# Patient Record
Sex: Female | Born: 1980 | Race: Black or African American | Hispanic: No | Marital: Married | Smoking: Current every day smoker
Health system: Southern US, Community
[De-identification: ages and names within clinical notes are randomized; demographics above are authoritative.]

## PROBLEM LIST (undated history)

## (undated) ENCOUNTER — Inpatient Hospital Stay (HOSPITAL_COMMUNITY): Payer: Self-pay

## (undated) DIAGNOSIS — D219 Benign neoplasm of connective and other soft tissue, unspecified: Secondary | ICD-10-CM

## (undated) DIAGNOSIS — N912 Amenorrhea, unspecified: Secondary | ICD-10-CM

## (undated) DIAGNOSIS — F32A Depression, unspecified: Secondary | ICD-10-CM

## (undated) DIAGNOSIS — F329 Major depressive disorder, single episode, unspecified: Secondary | ICD-10-CM

## (undated) DIAGNOSIS — R011 Cardiac murmur, unspecified: Secondary | ICD-10-CM

## (undated) DIAGNOSIS — G2581 Restless legs syndrome: Secondary | ICD-10-CM

## (undated) DIAGNOSIS — D649 Anemia, unspecified: Secondary | ICD-10-CM

## (undated) DIAGNOSIS — F419 Anxiety disorder, unspecified: Secondary | ICD-10-CM

## (undated) DIAGNOSIS — L259 Unspecified contact dermatitis, unspecified cause: Secondary | ICD-10-CM

## (undated) DIAGNOSIS — N390 Urinary tract infection, site not specified: Secondary | ICD-10-CM

## (undated) HISTORY — DX: Anemia, unspecified: D64.9

## (undated) HISTORY — DX: Amenorrhea, unspecified: N91.2

## (undated) HISTORY — DX: Benign neoplasm of connective and other soft tissue, unspecified: D21.9

## (undated) HISTORY — DX: Cardiac murmur, unspecified: R01.1

## (undated) HISTORY — DX: Restless legs syndrome: G25.81

## (undated) HISTORY — PX: WISDOM TOOTH EXTRACTION: SHX21

## (undated) HISTORY — DX: Unspecified contact dermatitis, unspecified cause: L25.9

## (undated) HISTORY — DX: Urinary tract infection, site not specified: N39.0

---

## 2010-10-16 NOTE — L&D Delivery Note (Signed)
Delivery Note At 11:32 PM a viable female was delivered via Vaginal, Spontaneous Delivery (Presentation: ; Occiput Transverse).  APGAR: 9, 9; weight 7-14.   Placenta status: Intact, Spontaneous.  Cord: 3 vessels with the following complications: None.  Cord pH: NA  Anesthesia: None  Episiotomy: None Lacerations: None Suture Repair: None Est. Blood Loss (mL): 350  Mom to postpartum.  Baby to nursery-stable.  OP Circ,  Placenta to [facility] Vasectomy Breast   Holly Vazquez 08/05/2011, 12:05 AM

## 2010-10-24 LAB — TSH: TSH: 0.859

## 2010-10-25 LAB — FOLATE: Folate: >19.9

## 2011-01-12 LAB — CBC: Hemoglobin: 11.3 g/dL — AB (ref 12.0–16.0)

## 2011-01-12 LAB — ABO/RH

## 2011-01-12 LAB — RUBELLA ANTIBODY, IGM: Rubella: IMMUNE

## 2011-01-12 LAB — HIV ANTIBODY (ROUTINE TESTING W REFLEX): HIV: NONREACTIVE

## 2011-01-12 LAB — ANTIBODY SCREEN: Antibody Screen: NEGATIVE

## 2011-01-12 LAB — RPR: RPR: NONREACTIVE

## 2011-01-12 LAB — HEPATITIS B SURFACE ANTIGEN: Hepatitis B Surface Ag: NEGATIVE

## 2011-02-15 LAB — GLUCOSE, 1 HOUR GESTATIONAL: Blood: 73

## 2011-07-05 ENCOUNTER — Encounter: Payer: Self-pay | Admitting: Obstetrics and Gynecology

## 2011-07-05 LAB — VITAMIN B12: Vitamin B12 Deficiency Panel: 947

## 2011-07-06 ENCOUNTER — Ambulatory Visit (INDEPENDENT_AMBULATORY_CARE_PROVIDER_SITE_OTHER): Payer: Medicaid Other | Admitting: Family

## 2011-07-06 VITALS — BP 111/68 | Temp 97.9°F | Ht 63.0 in | Wt 159.3 lb

## 2011-07-06 DIAGNOSIS — Z331 Pregnant state, incidental: Secondary | ICD-10-CM

## 2011-07-06 DIAGNOSIS — D649 Anemia, unspecified: Secondary | ICD-10-CM | POA: Insufficient documentation

## 2011-07-06 LAB — POCT URINALYSIS DIP (DEVICE)
Glucose, UA: NEGATIVE mg/dL
Hgb urine dipstick: NEGATIVE
Ketones, ur: NEGATIVE mg/dL
Protein, ur: NEGATIVE mg/dL
Specific Gravity, Urine: 1.02 (ref 1.005–1.030)
Urobilinogen, UA: 0.2 mg/dL (ref 0.0–1.0)

## 2011-07-06 MED ORDER — INTEGRA F 125-1 MG PO CAPS: 1.0000 | ORAL_CAPSULE | Freq: Every day | ORAL | Status: DC

## 2011-07-06 NOTE — Progress Notes (Signed)
Edema-feet/hands. Pain/Pressure- lower pelvic, "tightening".  Clear/white/"gold" vaginal discharge w/o itching.  Pt c/o tingling in hands and feet sometimes, and also very tired.

## 2011-07-06 NOTE — Progress Notes (Signed)
Reviewed prenatal records from [place]; problems with iron and constipation; reports irregular contractions, cervix closed.  Reviewed practice and call schedule.  Considering water birth; discussed liner.  Transfer to low risk clinic; obtain lab from [place] (1hr glucola at 28 wks); early GCT wnl.

## 2011-07-07 LAB — GC/CHLAMYDIA PROBE AMP, GENITAL: GC Probe Amp, Genital: NEGATIVE

## 2011-07-12 ENCOUNTER — Ambulatory Visit (INDEPENDENT_AMBULATORY_CARE_PROVIDER_SITE_OTHER): Payer: Medicaid Other | Admitting: Family Medicine

## 2011-07-12 VITALS — BP 114/71 | Temp 98.7°F | Wt 161.1 lb

## 2011-07-12 DIAGNOSIS — Z3480 Encounter for supervision of other normal pregnancy, unspecified trimester: Secondary | ICD-10-CM

## 2011-07-12 LAB — POCT URINALYSIS DIP (DEVICE)
Bilirubin Urine: NEGATIVE
Glucose, UA: NEGATIVE mg/dL
Hgb urine dipstick: NEGATIVE
Ketones, ur: NEGATIVE mg/dL
Nitrite: NEGATIVE
Protein, ur: NEGATIVE mg/dL
Specific Gravity, Urine: 1.01 (ref 1.005–1.030)
Urobilinogen, UA: 0.2 mg/dL (ref 0.0–1.0)
pH: 6 (ref 5.0–8.0)

## 2011-07-12 NOTE — Progress Notes (Signed)
Patient was seen and evaluated with PA-S Joseph Berkshire, agree with documentation.

## 2011-07-12 NOTE — Progress Notes (Signed)
P=84, c/o pelvic pain/pressure- worse on left, and lower back pain,

## 2011-07-12 NOTE — Patient Instructions (Signed)
SEEK IMMEDIATE MEDICAL CARE IF:  You develop contractions that continue to become stronger, more regular, and closer together.   You have a gushing, burst or leaking of fluid from the vagina.   An oral temperature above 100.4 F develops.   You have passage of blood-tinged mucus.   You develop vaginal bleeding.   You develop continuous belly (abdominal) pain.   You have low back pain that you never had before.   You feel the baby's head pushing down causing pelvic pressure.   The baby is not moving as much as it used to.  Document Released: 10/02/2005 Document Re-Released: 03/22/2010 ExitCare Patient Information 2011 [employer]. 

## 2011-07-12 NOTE — Progress Notes (Signed)
Subjective:    Holly Vazquez is a 29 y.o. female being seen today for her obstetrical visit. She is at [redacted]w[redacted]d gestation. Patient reports no bleeding, no leaking and occasional contractions. Fetal movement: normal.  Menstrual History: OB History    Grav Para Term Preterm Abortions TAB SAB Ect Mult Living   4 3 3  0 0 0 0 0 0 3        Objective:    BP 114/71  Temp 98.7 F (37.1 C)  Wt 161 lb 1.6 oz (73.074 kg)  LMP 10/26/2010  Breastfeeding? Unknown FHT: 140 BPM  Uterine Size: 37 cm  Presentations: unsure  Pelvic Exam:              Dilation: Closed       Effacement: 50%             Station:  -3    Consistency: soft            Position: posterior     Assessment:    Pregnancy 37 and 0/7 weeks   Plan:   Plans for delivery: Vaginal anticipated Beta strep culture: done Counseling: Infant feeding: plans to breastfeed. Fetal testing: discussed Follow up in 1 Week.   Birth control options discussed in detail. Patient has not yet decided what method she will use.

## 2011-07-19 ENCOUNTER — Ambulatory Visit (INDEPENDENT_AMBULATORY_CARE_PROVIDER_SITE_OTHER): Payer: Medicaid Other | Admitting: Obstetrics and Gynecology

## 2011-07-19 VITALS — BP 121/70 | Temp 98.5°F | Wt 163.5 lb

## 2011-07-19 DIAGNOSIS — Z3480 Encounter for supervision of other normal pregnancy, unspecified trimester: Secondary | ICD-10-CM

## 2011-07-19 DIAGNOSIS — Z3490 Encounter for supervision of normal pregnancy, unspecified, unspecified trimester: Secondary | ICD-10-CM

## 2011-07-19 LAB — POCT URINALYSIS DIP (DEVICE)
Hgb urine dipstick: NEGATIVE
Ketones, ur: NEGATIVE mg/dL
Protein, ur: NEGATIVE mg/dL
Specific Gravity, Urine: 1.015 (ref 1.005–1.030)

## 2011-07-19 NOTE — Progress Notes (Signed)
Pt reports pelvic pain and pressure as well as contractions. Pt declines flu shot, form signed.

## 2011-07-19 NOTE — Progress Notes (Signed)
This patient is a 30 year old gravida 4 para 29 and her 38th week of pregnancy. She's had no problems and few complaints.  Her blood pressure today is 121/70 pulse of 86 per minute. Fetal heart is 148 and the mid suprapubic area. The fetus palpates in a vertex presentation with the back to the left side. She is no edema DTRs are within normal limits and no proteinuria.  We've discussed with the patient does procedures occur when she reaches her due date and if not delivered at 41 weeks she'll be induced.   She will return for routine visit in one week.

## 2011-08-02 ENCOUNTER — Ambulatory Visit (INDEPENDENT_AMBULATORY_CARE_PROVIDER_SITE_OTHER): Payer: Medicaid Other | Admitting: Family Medicine

## 2011-08-02 ENCOUNTER — Encounter: Payer: Self-pay | Admitting: Family Medicine

## 2011-08-02 DIAGNOSIS — O0930 Supervision of pregnancy with insufficient antenatal care, unspecified trimester: Secondary | ICD-10-CM

## 2011-08-02 LAB — POCT URINALYSIS DIP (DEVICE)
Bilirubin Urine: NEGATIVE
Glucose, UA: NEGATIVE mg/dL
Hgb urine dipstick: NEGATIVE
Specific Gravity, Urine: 1.02 (ref 1.005–1.030)
Urobilinogen, UA: 0.2 mg/dL (ref 0.0–1.0)

## 2011-08-02 NOTE — Progress Notes (Signed)
Patient seen - irreg contractions.  Good fetal activity.  No vaginal bleeding, d/c.  Will schedule for induction next week.  Follow up in 1 week.

## 2011-08-02 NOTE — Progress Notes (Signed)
Edema- feet. Pulse- 84. Pain/pressure- "during the night". Vaginal discharge- "clear, yellowish"

## 2011-08-02 NOTE — Patient Instructions (Signed)
Labor Induction A pregnant woman usually goes into labor spontaneously before the birth of her baby. Most babies are born between 37 and 42 weeks of the pregnancy. When this does not happen, caregivers may use medication or other methods to bring on (induce) labor. Labor induction causes a pregnant woman's uterus to contract, the cervix to open (dilate) and thin out (efface) to prepare for the vaginal birth of her baby. Several methods of labor induction may be used such as:  Massaging the nipple and areola of the breasts (nipple stimulation).   Prostaglandin medication used orally or as a vaginal cream.   Striping of membranes (your caregiver inserts a finger between the cervix and membranes around the baby's head) causes the body to produce prostaglandins that soften the cervix and cause the uterus to contract.   Rupture of the water bag (amniotomy).   Oxytocin by IV.   Special dilators placed into the cervical canal that causes the cervix to soften and open.   Mechanical devices to stretch open the cervix such as, a dilated foley catheter.  Whether your labor will be induced depends on the condition of you and your baby, how far along you are, are the baby's lung maturity, the condition of the cervix, the way the baby is lying, and other factors. Usually, labor is not induced before 39 weeks of the pregnancy unless there is a problem with the baby or mother, and it becomes necessary to induce labor. REASONS LABOR SHOULD BE INDUCED:  The health of the baby or mother has become at risk.   The pregnancy is overdue by 2 weeks or more.   Your water breaks (premature rupture of membranes), the baby's lungs are mature, and labor does not start on its own.   You develop high blood pressure (toxemia of pregnancy).   You develop an infection in your uterus.   You have diabetes or other serious medical illness.   Amniotic fluid amounts are small around the baby.   Your placenta begins to  separate from the inner wall of the uterus before the baby is born (placental abruption). This condition may cause you to have an emergency Cesarean delivery.   You have fetal death.   A social induction is also known as an induction for convenience. Most of the time, labor is induced for sound medical reasons. Sometimes, it is done as a convenience. Living a long way from the hospital or having a history of very rapid labors may be reasons the mother may want to induce delivery.  REASONS LABOR SHOULD NOT BE INDUCED:  You have had previous surgeries on your uterus. This is especially true if the surgeries went into the inside lining and cavity of the uterus. This gives an added risk for rupturing the uterus.   You have placenta previa. This means your placenta lies very low in the uterus and blocks the opening (cervix) for the baby to get out.   Your baby is not in a head down position. For example, if your baby lies across your uterus (transverse) instead of head first.   If the umbilical cord drops down into the birth canal in front of your baby. This could cut off the baby's blood supply and oxygen to the baby.  RISKS & COMPLICATIONS OF THE PROCEDURE Problems seldom occur with labor induction, but there can be some complications. Some of the risks of induction include:  Change in fetal heart rate (too high, too low or irradic).     Increased risk of a premature baby, even if you think your baby is term.   Increased risk of fetal distress. This means your baby gets into problems during induction. This can be caused by the umbilical cord coming out in front of the baby or is being squeezed.   Increased risk of infection to mother and baby.   Increased chance of having a Cesarean delivery. This is an operation on your belly (abdomen) to remove the baby.   Strong contractions can lead to abruption. This is a separating of the placenta from the uterus.   Uterine rupture, especially if you  had a previous Cesarean or surgery on your uterus.  When labor is induced because of medical problems, other risks may be present. Induced labor may lead to:  Increased use of medications for pain relief.   Continuous fetal monitoring.   Other interventions.  When induction is needed for medical reasons, the benefits of induction may outweigh the risks. INDUCTION PROCEDURE It can sometimes take up to 2 or 3 days to induce labor. It usually takes less time. It takes longer when you are induced early in the pregnancy and for first pregnancies.  Before coming to the hospital for an induction:  Do not eat much before you come to the hospital (for at least 8 hours).   Do not eat after midnight if you are going to be induced the next morning.   Be aware that medications for labor induction can upset your stomach.   Let your caregiver know if you need medications for pain.  HOME CARE INSTRUCTIONS If you have been induced in your caregiver's office to start labor, and are allowed to go home, follow the instructions given to you by your care giver. SEEK IMMEDIATE MEDICAL CARE IF:  You develop any kind of vaginal bleeding.   You develop contractions that are severe and continuous.   You feel faint or feel light headed.   You do not develop contractions within the time your caregiver suggests you should.   You begin to run a temperature of 100 F (37.8 C) or develop chills.   You no longer feel the normal fetal movement.  Document Released: 02/21/2007 Document Re-Released: 07/29/2009 ExitCare Patient Information 2011 [employer]. 

## 2011-08-04 ENCOUNTER — Encounter (HOSPITAL_COMMUNITY): Payer: Self-pay

## 2011-08-04 ENCOUNTER — Inpatient Hospital Stay (HOSPITAL_COMMUNITY)
Admission: AD | Admit: 2011-08-04 | Discharge: 2011-08-06 | DRG: 775 | Disposition: A | Discharge status: Home / Self Care | Payer: Medicaid Other | Source: Ambulatory Visit | Attending: Obstetrics & Gynecology | Admitting: Obstetrics & Gynecology

## 2011-08-04 DIAGNOSIS — IMO0001 Reserved for inherently not codable concepts without codable children: Secondary | ICD-10-CM

## 2011-08-04 DIAGNOSIS — O9902 Anemia complicating childbirth: Principal | ICD-10-CM | POA: Diagnosis present

## 2011-08-04 DIAGNOSIS — D649 Anemia, unspecified: Secondary | ICD-10-CM | POA: Diagnosis present

## 2011-08-04 HISTORY — DX: Depression, unspecified: F32.A

## 2011-08-04 HISTORY — DX: Anxiety disorder, unspecified: F41.9

## 2011-08-04 HISTORY — DX: Major depressive disorder, single episode, unspecified: F32.9

## 2011-08-04 LAB — CBC
MCV: 73.8 fL — ABNORMAL LOW (ref 78.0–100.0)
Platelets: 163 10*3/uL (ref 150–400)
RBC: 5.07 MIL/uL (ref 3.87–5.11)
WBC: 9.5 10*3/uL (ref 4.0–10.5)

## 2011-08-04 MED ORDER — OXYTOCIN 20 UNITS IN LACTATED RINGERS INFUSION - SIMPLE
125.0000 mL/h | Freq: Once | INTRAVENOUS | Status: DC
  Administered 2011-08-04: 999 mL/h via INTRAVENOUS

## 2011-08-04 MED ORDER — OXYCODONE-ACETAMINOPHEN 5-325 MG PO TABS
2.0000 | ORAL_TABLET | ORAL | Status: DC | PRN
  Administered 2011-08-05: 2 via ORAL
  Filled 2011-08-04: qty 2

## 2011-08-04 MED ORDER — ACETAMINOPHEN 325 MG PO TABS: 650.0000 mg | ORAL_TABLET | ORAL | Status: DC | PRN

## 2011-08-04 MED ORDER — IBUPROFEN 600 MG PO TABS: 600.0000 mg | ORAL_TABLET | Freq: Four times a day (QID) | ORAL | Status: DC | PRN

## 2011-08-04 MED ORDER — LACTATED RINGERS IV SOLN
INTRAVENOUS | Status: DC
  Administered 2011-08-04: 125 mL/h via INTRAVENOUS

## 2011-08-04 MED ORDER — CITRIC ACID-SODIUM CITRATE 334-500 MG/5ML PO SOLN: 30.0000 mL | ORAL | Status: DC | PRN

## 2011-08-04 MED ORDER — LIDOCAINE HCL (PF) 1 % IJ SOLN
30.0000 mL | INTRAMUSCULAR | Status: DC | PRN
  Filled 2011-08-04 (×2): qty 30

## 2011-08-04 MED ORDER — LACTATED RINGERS IV SOLN: 500.0000 mL | INTRAVENOUS | Status: DC | PRN

## 2011-08-04 MED ORDER — FLEET ENEMA 7-19 GM/118ML RE ENEM: 1.0000 | ENEMA | RECTAL | Status: DC | PRN

## 2011-08-04 MED ORDER — ONDANSETRON HCL 4 MG/2ML IJ SOLN: 4.0000 mg | Freq: Four times a day (QID) | INTRAMUSCULAR | Status: DC | PRN

## 2011-08-04 MED ORDER — OXYTOCIN BOLUS FROM INFUSION
500.0000 mL | Freq: Once | INTRAVENOUS | Status: DC
  Filled 2011-08-04: qty 1000
  Filled 2011-08-04: qty 500

## 2011-08-04 MED ORDER — NALBUPHINE SYRINGE 5 MG/0.5 ML
5.0000 mg | INJECTION | INTRAMUSCULAR | Status: DC
  Administered 2011-08-04 (×2): 5 mg via INTRAVENOUS
  Filled 2011-08-04 (×10): qty 0.5

## 2011-08-04 NOTE — Progress Notes (Signed)
Pt states unsure of cervical exam, g4, denies lof, has lost mucus plug, small amt bloody show, +FM. Ctx's q3-5 minutes x1 hour.

## 2011-08-04 NOTE — H&P (Signed)
Holly Vazquez is a 30 y.o. G4P3003 at 40.2 wks via LMP verified by first trimester ultrasound who is presenting for contractions and a labor check.  She has received her prenatal care here at low risk clinic at [facility]  She transferred her care at 36 weeks after having PNC s/p 8 weeks in [place].  Only complication with pregnancy was anemia, otherwise she's doing well.  She currently denies any nausea, vomiting, fevers, chills, vaginal fluid, decrease in fetal activity, vaginal bleeding, or lower extermity pain or swelling.   Maternal Medical History:  Reason for admission: Reason for admission: contractions.  Contractions: Frequency: regular.   Perceived severity is moderate.    Prenatal complications: No bleeding.     OB History    Grav Para Term Preterm Abortions TAB SAB Ect Mult Living   4 3 3  0 0 0 0 0 0 3     Past Medical History  Diagnosis Date  . Heart murmur   . Anemia     due to heavy menses  . Restless legs syndrome (RLS)   . Urinary tract infection, site not specified   . Absence of menstruation   . Contact dermatitis and other eczema, due to unspecified cause   . Anxiety   . Depression    Past Surgical History  Procedure Date  . Wisdom tooth extraction    Family History: family history includes Hypertension in her father. Social History:  reports that she has never smoked. She has never used smokeless tobacco. She reports that she does not drink alcohol or use illicit drugs.  Review of Systems  Constitutional: Negative for fever and chills.  HENT: Negative for hearing loss and neck pain.   Eyes: Negative for blurred vision and double vision.  Respiratory: Negative for cough, shortness of breath and wheezing.   Cardiovascular: Negative.  Negative for chest pain.  Gastrointestinal: Negative for heartburn and vomiting.  Genitourinary: Negative for dysuria and urgency.  Musculoskeletal: Negative for myalgias.  Skin: Negative for itching and  rash.  Neurological: Negative for dizziness and headaches.  Psychiatric/Behavioral: Negative for depression.    Dilation: 4 Effacement (%): 80 Station: -2 Exam by:: BBOW, VSmith, CNM Blood pressure 121/81, pulse 107, temperature 98.3 F (36.8 C), temperature source Oral, resp. rate 16, height 5\' 4"  (1.626 m), weight 162 lb 5 oz (73.624 kg), last menstrual period 10/26/2010, unknown if currently breastfeeding.   Exam Physical Exam  Constitutional: She is oriented to person, place, and time. She appears well-developed and well-nourished.  HENT:  Head: Normocephalic and atraumatic.  Eyes: Conjunctivae and EOM are normal. Pupils are equal, round, and reactive to light.  Neck: Normal range of motion. Neck supple.  Cardiovascular: Normal rate, regular rhythm and normal heart sounds.   Respiratory: Effort normal and breath sounds normal.  GI: Soft. Bowel sounds are normal.       Gravid uterus, fundal heights consistent with dates.    Musculoskeletal: Normal range of motion.  Neurological: She is alert and oriented to person, place, and time. She has normal reflexes.  Skin: Skin is warm and dry.  Psychiatric: She has a normal mood and affect. Her behavior is normal.    Prenatal labs: ABO, Rh: B/Positive/-- (03/29 0000) Antibody: Negative (03/29 0000) Rubella: Immune (03/29 0000) RPR: Nonreactive (03/29 0000)  HBsAg: Negative (03/29 0000)  HIV: Non-reactive (03/29 0000)  GBS: Negative (09/20 0000)  1 hour Glucola:  73  Assessment/Plan: 30 yo G4P3003 at 40.2 weeks in active labor: - Admit  to labor and delivery ward. - GBS negative  - Epidural upon request for pain control.    WALDEN,JEFF 08/04/2011, 7:38 PM  Dating criteria addended.   Dorathy Kinsman 08/04/2011 8:28 PM

## 2011-08-05 ENCOUNTER — Encounter (HOSPITAL_COMMUNITY): Payer: Self-pay | Admitting: Family Medicine

## 2011-08-05 LAB — RPR: RPR Ser Ql: NONREACTIVE

## 2011-08-05 MED ORDER — METHYLERGONOVINE MALEATE 0.2 MG/ML IJ SOLN: 0.2000 mg | INTRAMUSCULAR | Status: DC | PRN

## 2011-08-05 MED ORDER — SIMETHICONE 80 MG PO CHEW: 80.0000 mg | CHEWABLE_TABLET | ORAL | Status: DC | PRN

## 2011-08-05 MED ORDER — ZOLPIDEM TARTRATE 5 MG PO TABS: 5.0000 mg | ORAL_TABLET | Freq: Every evening | ORAL | Status: DC | PRN

## 2011-08-05 MED ORDER — DIPHENHYDRAMINE HCL 25 MG PO CAPS: 25.0000 mg | ORAL_CAPSULE | Freq: Four times a day (QID) | ORAL | Status: DC | PRN

## 2011-08-05 MED ORDER — SENNOSIDES-DOCUSATE SODIUM 8.6-50 MG PO TABS
2.0000 | ORAL_TABLET | Freq: Every day | ORAL | Status: DC
  Administered 2011-08-05: 2 via ORAL

## 2011-08-05 MED ORDER — PRENATAL PLUS 27-1 MG PO TABS
1.0000 | ORAL_TABLET | Freq: Every day | ORAL | Status: DC
  Administered 2011-08-05 – 2011-08-06 (×2): 1 via ORAL
  Filled 2011-08-05 (×2): qty 1

## 2011-08-05 MED ORDER — OXYCODONE-ACETAMINOPHEN 5-325 MG PO TABS
1.0000 | ORAL_TABLET | ORAL | Status: DC | PRN
  Administered 2011-08-05 (×3): 1 via ORAL
  Administered 2011-08-06: 2 via ORAL
  Filled 2011-08-05 (×3): qty 1
  Filled 2011-08-05: qty 2

## 2011-08-05 MED ORDER — DIBUCAINE 1 % RE OINT: 1.0000 "application " | TOPICAL_OINTMENT | RECTAL | Status: DC | PRN

## 2011-08-05 MED ORDER — IBUPROFEN 600 MG PO TABS
600.0000 mg | ORAL_TABLET | Freq: Four times a day (QID) | ORAL | Status: DC
  Administered 2011-08-05 – 2011-08-06 (×6): 600 mg via ORAL
  Filled 2011-08-05 (×6): qty 1

## 2011-08-05 MED ORDER — METHYLERGONOVINE MALEATE 0.2 MG PO TABS: 0.2000 mg | ORAL_TABLET | ORAL | Status: DC | PRN

## 2011-08-05 MED ORDER — TETANUS-DIPHTH-ACELL PERTUSSIS 5-2.5-18.5 LF-MCG/0.5 IM SUSP: 0.5000 mL | Freq: Once | INTRAMUSCULAR | Status: DC

## 2011-08-05 MED ORDER — ONDANSETRON HCL 4 MG PO TABS: 4.0000 mg | ORAL_TABLET | ORAL | Status: DC | PRN

## 2011-08-05 MED ORDER — WITCH HAZEL-GLYCERIN EX PADS: 1.0000 "application " | MEDICATED_PAD | CUTANEOUS | Status: DC | PRN

## 2011-08-05 MED ORDER — ONDANSETRON HCL 4 MG/2ML IJ SOLN: 4.0000 mg | INTRAMUSCULAR | Status: DC | PRN

## 2011-08-05 MED ORDER — MEDROXYPROGESTERONE ACETATE 150 MG/ML IM SUSP: 150.0000 mg | INTRAMUSCULAR | Status: DC | PRN

## 2011-08-05 MED ORDER — MAGNESIUM HYDROXIDE 400 MG/5ML PO SUSP: 30.0000 mL | ORAL | Status: DC | PRN

## 2011-08-05 MED ORDER — FERROUS SULFATE 325 (65 FE) MG PO TABS
325.0000 mg | ORAL_TABLET | Freq: Two times a day (BID) | ORAL | Status: DC
  Administered 2011-08-05 – 2011-08-06 (×3): 325 mg via ORAL
  Filled 2011-08-05 (×3): qty 1

## 2011-08-05 MED ORDER — MEASLES, MUMPS & RUBELLA VAC ~~LOC~~ INJ
0.5000 mL | INJECTION | Freq: Once | SUBCUTANEOUS | Status: DC
  Filled 2011-08-05: qty 0.5

## 2011-08-05 MED ORDER — LANOLIN HYDROUS EX OINT: 1.0000 "application " | TOPICAL_OINTMENT | CUTANEOUS | Status: DC | PRN

## 2011-08-05 MED ORDER — BENZOCAINE-MENTHOL 20-0.5 % EX AERO: 1.0000 "application " | INHALATION_SPRAY | CUTANEOUS | Status: DC | PRN

## 2011-08-05 NOTE — Progress Notes (Signed)
Holly Vazquez is a 30 y.o. [id number] Subjective: Coping well w/ nubain  Objective: BP 119/78  Pulse 100  Temp(Src) 97.8 F (36.6 C) (Oral)  Resp 20  Ht 5\' 4"  (1.626 m)  Wt 73.624 kg (162 lb 5 oz)  BMI 27.86 kg/m2  LMP 10/26/2010  Breastfeeding? Unknown      FHT:  FHR: 130 bpm, variability: moderate,  accelerations:  Present,  decelerations:  Absent UC:   regular, every 3 minutes SVE:   Dilation: 6 Effacement (%): 90 Station: -1 Taylor, RN  Labs: Lab Results  Component Value Date   WBC 9.5 08/04/2011   HGB 11.8* 08/04/2011   HCT 37.4 08/04/2011   MCV 73.8* 08/04/2011   PLT 163 08/04/2011    Assessment / Plan: Spontaneous labor, progressing normally  Labor: Progressing normally Preeclampsia:  NA Fetal Wellbeing:  Category I Pain Control:  Nubain I/D:  n/a Anticipated MOD:  NSVD  Holly Vazquez 08/04/2011, 22:30 AM

## 2011-08-05 NOTE — Progress Notes (Signed)
NSVD of a viable female.

## 2011-08-05 NOTE — H&P (Signed)
Agree with above note.  Holly Vazquez H. 08/05/2011 5:04 AM

## 2011-08-05 NOTE — Progress Notes (Signed)
Post Partum Day 1 Subjective: no complaints, up ad lib, voiding, tolerating PO, + flatus and baby is doing well  Objective: Blood pressure 113/67, pulse 89, temperature 98.2 F (36.8 C), temperature source Oral, resp. rate 20, height 5\' 4"  (1.626 m), weight 73.624 kg (162 lb 5 oz), last menstrual period 10/26/2010, SpO2 97.00%, unknown if currently breastfeeding.  Physical Exam:  General: alert and no distress Lochia: appropriate Uterine Fundus: firm DVT Evaluation: No evidence of DVT seen on physical exam.   Basename 08/04/11 2024  HGB 11.8*  HCT 37.4    Assessment/Plan: Plan for discharge tomorrow, Breastfeeding, Lactation consult and Contraception is vasectomy for female partner.  Considering outpatient circumcision for her female infant. Continue routine postpartum care.   LOS: 1 day   ANYANWU,UGONNA A 08/05/2011, 9:04 AM

## 2011-08-06 MED ORDER — IBUPROFEN 600 MG PO TABS: 600.0000 mg | ORAL_TABLET | Freq: Four times a day (QID) | ORAL | Status: AC | PRN

## 2011-08-06 NOTE — Discharge Summary (Signed)
Obstetric Discharge Summary Reason for Admission: onset of labor Prenatal Procedures: NST Intrapartum Procedures: spontaneous vaginal delivery Postpartum Procedures: none Complications-Operative and Postpartum: none Hemoglobin  Date Value Range Status  08/04/2011 11.8* 12.0-15.0 (g/dL) Final     HCT  Date Value Range Status  08/04/2011 37.4  36.0-46.0 (%) Final    Discharge Diagnoses: Term Pregnancy-delivered  Discharge Information: Date: 08/06/2011 Activity: unrestricted and pelvic rest Diet: routine Medications: Ibuprofen Condition: stable Instructions: refer to practice specific booklet Discharge to: home Follow-up Information    Follow up with [facility]. Make an appointment in 6 weeks. (If you have excessive bleeding, pain or fever come to MAU as needed)    Contact information:   [address] [place] [address] [phone]        Plans vasectomy for contraception.  Declines other contraception in the interim.  Breastfeeding Return to clinic in 6 weeks for PP visit.  Newborn Data: Live born female  Birth Weight: 7 lb 14.3 oz (3580 g) APGAR: 8, 9  Home with mother.  [facility] 08/06/2011, 9:32 AM

## 2011-08-06 NOTE — Progress Notes (Signed)
Post Partum Day 2 Subjective: no complaints, up ad lib, voiding, tolerating PO and + flatus  Objective: Blood pressure 110/73, pulse 78, temperature 97.8 F (36.6 C), temperature source Oral, resp. rate 18, height 5\' 4"  (1.626 m), weight 73.624 kg (162 lb 5 oz), last menstrual period 10/26/2010, SpO2 97.00%, unknown if currently breastfeeding.  Physical Exam:  General: alert, cooperative and no distress Lochia: appropriate Uterine Fundus: firm Incision: healing well DVT Evaluation: No evidence of DVT seen on physical exam.   Basename 08/04/11 2024  HGB 11.8*  HCT 37.4    Assessment/Plan: Discharge home and Breastfeeding  Plan:  D/C home            Pt to arrange circumcision outside hospital           Plans vasectomy for contraception.  Declines other contraception in the interim.           Motrin prn cramps           Return to [facility] for 6 week postpartum visit   LOS: 2 days   [facility] 08/06/2011, 9:20 AM

## 2011-08-07 NOTE — Discharge Summary (Signed)
Agree with above note.  Deann Mclaine 08/07/2011 7:31 AM

## 2011-08-07 NOTE — Progress Notes (Signed)
UR chart review completed.  

## 2011-08-08 ENCOUNTER — Telehealth: Payer: Self-pay | Admitting: *Deleted

## 2011-08-08 MED ORDER — SULFAMETHOXAZOLE-TRIMETHOPRIM 800-160 MG PO TABS: 1.0000 | ORAL_TABLET | Freq: Two times a day (BID) | ORAL | Status: AC

## 2011-08-08 NOTE — Telephone Encounter (Signed)
After consulting Dr. Macon Large, I called Holly Vazquez.  She denies having chills or hot flashes and does not have a thermometer to check her temperature. She denies breast hardness or redness. She reports that her hand breast pump is not working well but the baby is nursing well and is relieving a good bit of the pressure. I informed her that we are prescribing an antibiotic for her. I also advised that she continue taking ibuprofen and using the cabbage leaves to breasts. I gave [place] the tel # for lactation and advised that she call them for telephone consult. I further advised Holly Vazquez to go to MAU if she develops sx of high fever or if her breasts become hard and/or red. Holly Vazquez voiced understanding.

## 2011-08-08 NOTE — Telephone Encounter (Signed)
Pt left message stating that she thinks she has mastitis. Her sx are painful,engorged breasts and low grade fever. She used cabbage leaves last night and is taking ibuprofen. She want to know what else to do.

## 2011-08-10 ENCOUNTER — Telehealth: Payer: Self-pay | Admitting: *Deleted

## 2011-08-10 MED ORDER — FLUCONAZOLE 150 MG PO TABS: 150.0000 mg | ORAL_TABLET | Freq: Once | ORAL | Status: AC

## 2011-08-10 NOTE — Telephone Encounter (Signed)
Pt left message stating that she was started on bactrim 2 days ago and now feels that she has a yeast infection. She is requesting a Rx. I consulted w/Dr. Jolayne Panther and received order.  I called pt and notified her of Rx sent to pharmacy. I also advised pt to eat 1 serving of yogurt daily while taking bactrim. Pt may also use hydrocortisone cream to labia if desired. Pt states she is feeling better and no longer has sx of mastitis. Pt voiced understanding.

## 2011-10-02 ENCOUNTER — Encounter: Payer: Self-pay | Admitting: Family Medicine

## 2011-10-02 ENCOUNTER — Ambulatory Visit (INDEPENDENT_AMBULATORY_CARE_PROVIDER_SITE_OTHER): Payer: Medicaid Other | Admitting: Family Medicine

## 2011-10-02 LAB — POCT URINALYSIS DIP (DEVICE)
Glucose, UA: NEGATIVE mg/dL
Hgb urine dipstick: NEGATIVE
Specific Gravity, Urine: 1.025 (ref 1.005–1.030)
Urobilinogen, UA: 0.2 mg/dL (ref 0.0–1.0)
pH: 6 (ref 5.0–8.0)

## 2011-10-02 NOTE — Progress Notes (Signed)
  Subjective:     Holly Vazquez is a 30 y.o. female who presents for a postpartum visit. She is 6 weeks postpartum following a spontaneous vaginal delivery. I have fully reviewed the prenatal and intrapartum course. Outcome: spontaneous vaginal delivery. Anesthesia: none. Postpartum course has been normal. Baby's course has been normal. Baby is feeding by both breast and bottle - Enfamil AR. Bleeding no bleeding. Bowel function is normal. Bladder function is normal. Patient is sexually active. Contraception method is condoms. Postpartum depression screening: negative.  The following portions of the patient's history were reviewed and updated as appropriate: allergies, current medications, past family history, past medical history, past social history, past surgical history and problem list.  Review of Systems Pertinent items are noted in HPI.   Objective:    BP 122/69  Pulse 69  Temp(Src) 98.3 F (36.8 C) (Oral)  Ht 5\' 3"  (1.6 m)  Wt 161 lb 12.8 oz (73.392 kg)  BMI 28.66 kg/m2  Breastfeeding? Yes  General:  alert, cooperative and no distress   Breasts:  not preformed.  Lungs: not indicated  Heart:  not indicated  Abdomen: soft, non-tender; bowel sounds normal; no masses,  no organomegaly   Vulva:  normal  Vagina: normal vagina  Cervix:  anteverted  Corpus: normal size, contour, position, consistency, mobility, non-tender  Adnexa:  normal adnexa and no mass, fullness, tenderness  Rectal Exam: Not performed.        Assessment:    Normal postpartum exam. Pap smear not done at today's visit.   Plan:    1. Contraception: condoms 2. Follow up in: 1 year or as needed.

## 2011-10-05 ENCOUNTER — Telehealth: Payer: Self-pay | Admitting: *Deleted

## 2011-10-05 NOTE — Telephone Encounter (Signed)
Patient called and left a message she is calling because her baby's doctor prescribed her baby nystatin and she wants to see if she can get nystatin prescribed so we won't keep passing thrush back and forth- pharmacy is [facility] on [address] Called patient and verified she is breastfeeding- informed patient I would either ask provider or send request via computer and we will get back with her as soon as we can. Patient states her only symptom is pain.

## 2011-10-05 NOTE — Telephone Encounter (Addendum)
Will sent to [facility] , CNM to review- received order for nystatin cream, called and notified patient

## 2012-01-07 ENCOUNTER — Inpatient Hospital Stay (HOSPITAL_COMMUNITY)
Admission: AD | Admit: 2012-01-07 | Discharge: 2012-01-07 | Disposition: A | Discharge status: Hospice | Payer: Self-pay | Source: Ambulatory Visit | Attending: Obstetrics & Gynecology | Admitting: Obstetrics & Gynecology

## 2012-01-07 ENCOUNTER — Encounter (HOSPITAL_COMMUNITY): Payer: Self-pay | Admitting: *Deleted

## 2012-01-07 DIAGNOSIS — R350 Frequency of micturition: Secondary | ICD-10-CM | POA: Insufficient documentation

## 2012-01-07 DIAGNOSIS — N390 Urinary tract infection, site not specified: Secondary | ICD-10-CM | POA: Insufficient documentation

## 2012-01-07 LAB — POCT PREGNANCY, URINE: Preg Test, Ur: NEGATIVE

## 2012-01-07 LAB — URINALYSIS, ROUTINE W REFLEX MICROSCOPIC
Glucose, UA: NEGATIVE mg/dL
Protein, ur: NEGATIVE mg/dL

## 2012-01-07 LAB — URINE MICROSCOPIC-ADD ON

## 2012-01-07 MED ORDER — SULFAMETHOXAZOLE-TMP DS 800-160 MG PO TABS: 1.0000 | ORAL_TABLET | Freq: Two times a day (BID) | ORAL | Status: AC

## 2012-01-07 MED ORDER — FLUCONAZOLE 150 MG PO TABS: 150.0000 mg | ORAL_TABLET | Freq: Once | ORAL | Status: AC

## 2012-01-07 NOTE — Discharge Instructions (Signed)

## 2012-01-07 NOTE — MAU Note (Signed)
Pt experiencing frequency, buriing and bad smell to urine for the past two weeks. Says she has been  Drinking cranberry juice to"help"

## 2012-01-07 NOTE — MAU Provider Note (Signed)
Chief Complaint:  Urinary Tract Infection    First Provider Initiated Contact with Patient 01/07/12 1749      Holly Vazquez is  31 y.o. [id number].  No LMP recorded..  Her pregnancy status is negative.  She presents complaining of Urinary Tract Infection . Pt experiencing frequency, buriing and bad smell to urine for the past two weeks. Says she has been Drinking cranberry juice to"help"  Obstetrical/Gynecological History: OB History    Grav Para Term Preterm Abortions TAB SAB Ect Mult Living   4 4 4  0 0 0 0 0 0 4      Past Medical History: Past Medical History  Diagnosis Date  . Heart murmur   . Anemia     due to heavy menses  . Restless legs syndrome (RLS)   . Urinary tract infection, site not specified   . Absence of menstruation   . Contact dermatitis and other eczema, due to unspecified cause   . Anxiety   . Depression     Past Surgical History: Past Surgical History  Procedure Date  . Wisdom tooth extraction     Family History: Family History  Problem Relation Age of Onset  . Hypertension Father   . Heart disease Maternal Grandmother   . Stroke Maternal Grandmother   . Hypertension Maternal Grandmother     Social History: History  Substance Use Topics  . Smoking status: Never Smoker   . Smokeless tobacco: Never Used  . Alcohol Use: No    Allergies:  Allergies  Allergen Reactions  . Latex Hives  . Nickel Dermatitis    trigger    Prescriptions prior to admission  Medication Sig Dispense Refill  . ferrous sulfate 325 (65 FE) MG tablet Take 325 mg by mouth daily.      . Multiple Vitamin (MULITIVITAMIN WITH MINERALS) TABS Take 1 tablet by mouth daily.        Review of Systems - Negative except what has been reviewed in the HPI  Physical Exam    General: General appearance - alert, well appearing, and in no distress and oriented to person, place, and time Mental status - alert, oriented to person, place, and time, normal mood, behavior,  speech, dress, motor activity, and thought processes, affect appropriate to mood Abdomen - soft, nontender, nondistended, no masses or organomegaly Focused Gynecological Exam: examination not indicated  Labs: Recent Results (from the past 24 hour(s))  URINALYSIS, ROUTINE W REFLEX MICROSCOPIC   Collection Time   01/07/12  5:08 PM      Component Value Range   Color, Urine YELLOW  YELLOW    APPearance HAZY (*) CLEAR    Specific Gravity, Urine 1.025  1.005 - 1.030    pH 6.0  5.0 - 8.0    Glucose, UA NEGATIVE  NEGATIVE (mg/dL)   Hgb urine dipstick TRACE (*) NEGATIVE    Bilirubin Urine NEGATIVE  NEGATIVE    Ketones, ur NEGATIVE  NEGATIVE (mg/dL)   Protein, ur NEGATIVE  NEGATIVE (mg/dL)   Urobilinogen, UA 0.2  0.0 - 1.0 (mg/dL)   Nitrite POSITIVE (*) NEGATIVE    Leukocytes, UA NEGATIVE  NEGATIVE   URINE MICROSCOPIC-ADD ON   Collection Time   01/07/12  5:08 PM      Component Value Range   Squamous Epithelial / LPF FEW (*) RARE    WBC, UA 3-6  <3 (WBC/hpf)   RBC / HPF 0-2  <3 (RBC/hpf)   Bacteria, UA MANY (*) RARE   POCT PREGNANCY, URINE  Collection Time   01/07/12  5:12 PM      Component Value Range   Preg Test, Ur NEGATIVE  NEGATIVE     Assessment: UIT  Plan: Discharge home Rx sent to pharmacy   [facility] E. 01/07/2012,5:56 PM

## 2012-02-07 NOTE — MAU Provider Note (Signed)
Attestation of Attending Supervision of Advanced Practitioner: Evaluation and management procedures were performed by the [facility] Fellow/PA/CNM/NP under my supervision and collaboration. Chart reviewed, and agree with management and plan.  Jaynie Collins, M.D. 02/07/2012 8:59 AM

## 2012-02-14 ENCOUNTER — Other Ambulatory Visit (INDEPENDENT_AMBULATORY_CARE_PROVIDER_SITE_OTHER): Payer: Self-pay | Admitting: *Deleted

## 2012-02-14 DIAGNOSIS — N390 Urinary tract infection, site not specified: Secondary | ICD-10-CM

## 2012-02-14 LAB — POCT URINALYSIS DIP (DEVICE)
Leukocytes, UA: NEGATIVE
Protein, ur: NEGATIVE mg/dL
Urobilinogen, UA: 0.2 mg/dL (ref 0.0–1.0)

## 2012-02-14 MED ORDER — SULFAMETHOXAZOLE-TRIMETHOPRIM 800-160 MG PO TABS: 1.0000 | ORAL_TABLET | Freq: Two times a day (BID) | ORAL | Status: AC

## 2012-02-14 NOTE — Progress Notes (Signed)
Here with complaints of uti- states having burning with urination, bad odor to urine, cloudy urine.   Review ua results with Maylon Cos, CNM- orders given for Septra and urine culture. Patient voices understanding.

## 2012-02-17 LAB — URINE CULTURE

## 2012-03-01 ENCOUNTER — Telehealth: Payer: Self-pay | Admitting: *Deleted

## 2012-03-01 NOTE — Telephone Encounter (Signed)
Pt states that she was recently treated for a uti but feels like it is coming back.

## 2012-03-05 ENCOUNTER — Other Ambulatory Visit: Payer: Self-pay | Admitting: Family Medicine

## 2012-03-05 DIAGNOSIS — N390 Urinary tract infection, site not specified: Secondary | ICD-10-CM

## 2012-03-05 MED ORDER — CIPROFLOXACIN HCL 500 MG PO TABS: 500.0000 mg | ORAL_TABLET | Freq: Two times a day (BID) | ORAL | Status: AC

## 2012-03-05 NOTE — Progress Notes (Signed)
Called pt and informed her that a new Rx has been sent to her pharmacy for UTI. She should call us back if she is still having sx after the medication is completed. Pt voiced understanding.

## 2012-05-05 ENCOUNTER — Encounter (HOSPITAL_COMMUNITY): Payer: Self-pay | Admitting: *Deleted

## 2012-05-05 ENCOUNTER — Inpatient Hospital Stay (HOSPITAL_COMMUNITY)
Admission: AD | Admit: 2012-05-05 | Discharge: 2012-05-05 | Disposition: A | Discharge status: Home / Self Care | Payer: Self-pay | Source: Ambulatory Visit | Attending: Obstetrics and Gynecology | Admitting: Obstetrics and Gynecology

## 2012-05-05 DIAGNOSIS — R079 Chest pain, unspecified: Secondary | ICD-10-CM | POA: Insufficient documentation

## 2012-05-05 DIAGNOSIS — R0781 Pleurodynia: Secondary | ICD-10-CM

## 2012-05-05 LAB — URINALYSIS, ROUTINE W REFLEX MICROSCOPIC
Bilirubin Urine: NEGATIVE
Glucose, UA: NEGATIVE mg/dL
Ketones, ur: NEGATIVE mg/dL
Protein, ur: NEGATIVE mg/dL

## 2012-05-05 LAB — URINE MICROSCOPIC-ADD ON

## 2012-05-05 MED ORDER — NAPROXEN SODIUM 550 MG PO TABS: 550.0000 mg | ORAL_TABLET | Freq: Two times a day (BID) | ORAL | Status: DC

## 2012-05-05 MED ORDER — HYDROCODONE-ACETAMINOPHEN 5-325 MG PO TABS: 1.0000 | ORAL_TABLET | ORAL | Status: AC | PRN

## 2012-05-05 NOTE — MAU Note (Signed)
Patient taken directly to room 7 from lobby.  

## 2012-05-05 NOTE — MAU Note (Signed)
Pt reports she has had a soreness under her L breast and rib area for several weeks

## 2012-05-05 NOTE — MAU Provider Note (Signed)
Attestation of Attending Supervision of Advanced Practitioner: Evaluation and management procedures were performed by the PA/NP/CNM/OB Fellow under my supervision/collaboration. Chart reviewed and agree with management and plan.  Mykaela Arena V 05/05/2012 1:13 PM

## 2012-05-05 NOTE — MAU Provider Note (Signed)
History     CSN: [id number]  Arrival date and time: 05/05/12 4098   First Provider Initiated Contact with Patient 05/05/12 1056      Chief Complaint  Patient presents with  . Chest Pain    rib pain   HPI Holly Vazquez Name is a 31 y.o. female who presents to MAU for pain under her breast. She is not pregnant. The pain started a few days ago. She describes the pain as a sore feeling. She rates the pain as 5/10. The pain is located to the left side of her left breast. The pain increases with wearing her bra. She has not taken any medication for pain. She denies nausea, vomiting, fever or other problems. She is a patient in the GYN Clinic. The history was provided by the patient.  OB History    Grav Para Term Preterm Abortions TAB SAB Ect Mult Living   4 4 4  0 0 0 0 0 0 4      Past Medical History  Diagnosis Date  . Heart murmur   . Anemia     due to heavy menses  . Restless legs syndrome (RLS)   . Urinary tract infection, site not specified   . Absence of menstruation   . Contact dermatitis and other eczema, due to unspecified cause   . Anxiety   . Depression     Past Surgical History  Procedure Date  . Wisdom tooth extraction     Family History  Problem Relation Age of Onset  . Hypertension Father   . Heart disease Maternal Grandmother   . Stroke Maternal Grandmother   . Hypertension Maternal Grandmother     History  Substance Use Topics  . Smoking status: Never Smoker   . Smokeless tobacco: Never Used  . Alcohol Use: No    Allergies:  Allergies  Allergen Reactions  . Latex Hives  . Nickel Dermatitis    trigger    Prescriptions prior to admission  Medication Sig Dispense Refill  . ferrous sulfate 325 (65 FE) MG tablet Take 325 mg by mouth daily.      . Multiple Vitamin (MULITIVITAMIN WITH MINERALS) TABS Take 1 tablet by mouth daily.        ROS: as stated in HPI Physical Exam   Blood pressure 115/65, pulse 82, temperature 98.9 F (37.2 C),  temperature source Oral, resp. rate 18, height 5\' 4"  (1.626 m), weight 166 lb 6.4 oz (75.479 kg), last menstrual period 04/26/2012.  Physical Exam  Nursing note and vitals reviewed. Constitutional: She is oriented to person, place, and time. She appears well-developed and well-nourished. No distress.  HENT:  Head: Normocephalic and atraumatic.  Eyes: EOM are normal.  Neck: Neck supple.  Cardiovascular: Normal rate.   Respiratory: Effort normal. She exhibits tenderness. Right breast exhibits no inverted nipple, no mass, no nipple discharge and no tenderness. Left breast exhibits no inverted nipple, no mass, no nipple discharge and no tenderness (breast without tenderness, tender left anterior ribs).    GI: Soft. There is no tenderness.  Genitourinary: Vagina normal.  Musculoskeletal: Normal range of motion.  Neurological: She is alert and oriented to person, place, and time.  Skin: Skin is warm and dry.  Psychiatric: She has a normal mood and affect. Her behavior is normal. Judgment and thought content normal.   Assessment: 31 y.o. female with left anterior rib pain  Plan:  Naproxen Rx   Follow up in GYN Clinic, return here as needed  I have  reviewed this patient's vital signs, nurses notes and appropriate labs. I have discussed in detail with the patient need for follow up with the GYN Clinic. I will send a note to GYN Clinic to call patient for follow up. MAU Course  Procedures  [place], RN, FNP, [facility]

## 2012-05-06 ENCOUNTER — Encounter: Payer: Self-pay | Admitting: Obstetrics & Gynecology

## 2012-05-20 ENCOUNTER — Encounter: Payer: Self-pay | Admitting: Physician Assistant

## 2012-08-30 ENCOUNTER — Other Ambulatory Visit (INDEPENDENT_AMBULATORY_CARE_PROVIDER_SITE_OTHER): Payer: BC Managed Care – PPO

## 2012-08-30 VITALS — Temp 98.6°F

## 2012-08-30 DIAGNOSIS — R300 Dysuria: Secondary | ICD-10-CM

## 2012-08-30 LAB — POCT URINALYSIS DIP (DEVICE)
Bilirubin Urine: NEGATIVE
Ketones, ur: NEGATIVE mg/dL

## 2012-08-30 MED ORDER — FLUCONAZOLE 150 MG PO TABS: 150.0000 mg | ORAL_TABLET | Freq: Once | ORAL | Status: DC

## 2012-08-30 MED ORDER — SULFAMETHOXAZOLE-TRIMETHOPRIM 800-160 MG PO TABS: 1.0000 | ORAL_TABLET | Freq: Two times a day (BID) | ORAL | Status: DC

## 2012-08-30 MED ORDER — PHENAZOPYRIDINE HCL 200 MG PO TABS: 200.0000 mg | ORAL_TABLET | Freq: Three times a day (TID) | ORAL | Status: DC | PRN

## 2012-08-30 NOTE — Addendum Note (Signed)
Addended by: Sherre Lain A on: 08/30/2012 09:22 AM   Modules accepted: Level of Service

## 2012-09-02 LAB — URINE CULTURE

## 2012-09-04 ENCOUNTER — Other Ambulatory Visit: Payer: Self-pay | Admitting: Obstetrics & Gynecology

## 2012-09-20 ENCOUNTER — Ambulatory Visit: Payer: Self-pay | Admitting: Obstetrics & Gynecology

## 2012-10-16 NOTE — L&D Delivery Note (Signed)
Delivery Note Patient complete and pushing.  SVD of baby A head with one push. 2nd push completed delivery. Spontaneous cry heard. Infant skin to skin with mom then dad. Cord clamped x 2, cut.  Cord blood obtained.  AROM of baby B done after confirmation of breech via U/S.  Feet grabbed and pulled double footling breech easily. Placed skin to skin with mom.  Cord clamped x 2, cut,  Cord blood obtained.    Apgars and weights per delivery summary.  Placentas delivered spontaneously and intact with 3-VC.  Bimanual exam revealed firm uterus, clots removed.  Pitocin started.  No lacerations noted. EBL 500 cc Placenta to pathology. Anesthesia: Epidural. Infants remain skin to skin with mom, mom to pp stable.  Holly Vazquez S 08/02/2013, 6:33 PM

## 2012-12-13 ENCOUNTER — Ambulatory Visit (INDEPENDENT_AMBULATORY_CARE_PROVIDER_SITE_OTHER): Payer: Self-pay

## 2013-01-04 ENCOUNTER — Inpatient Hospital Stay (HOSPITAL_COMMUNITY)
Admission: AD | Admit: 2013-01-04 | Discharge: 2013-01-05 | Disposition: A | Discharge status: Home / Self Care | Payer: BC Managed Care – PPO | Source: Ambulatory Visit | Attending: Obstetrics & Gynecology | Admitting: Obstetrics & Gynecology

## 2013-01-04 ENCOUNTER — Encounter (HOSPITAL_COMMUNITY): Payer: Self-pay | Admitting: *Deleted

## 2013-01-04 DIAGNOSIS — K219 Gastro-esophageal reflux disease without esophagitis: Secondary | ICD-10-CM

## 2013-01-04 DIAGNOSIS — O219 Vomiting of pregnancy, unspecified: Secondary | ICD-10-CM

## 2013-01-04 DIAGNOSIS — R1012 Left upper quadrant pain: Secondary | ICD-10-CM | POA: Insufficient documentation

## 2013-01-04 DIAGNOSIS — O210 Mild hyperemesis gravidarum: Secondary | ICD-10-CM | POA: Insufficient documentation

## 2013-01-04 DIAGNOSIS — O99891 Other specified diseases and conditions complicating pregnancy: Secondary | ICD-10-CM | POA: Insufficient documentation

## 2013-01-04 DIAGNOSIS — R079 Chest pain, unspecified: Secondary | ICD-10-CM | POA: Insufficient documentation

## 2013-01-04 MED ORDER — GI COCKTAIL ~~LOC~~
30.0000 mL | Freq: Once | ORAL | Status: AC
  Administered 2013-01-05: 30 mL via ORAL
  Filled 2013-01-04: qty 30

## 2013-01-04 NOTE — MAU Provider Note (Signed)
History     CSN: [id number]  Arrival date and time: 01/04/13 2222   First Provider Initiated Contact with Patient 01/04/13 2327      Chief Complaint  Patient presents with  . Chest Pain  . Shortness of Breath  . Possible Pregnancy   HPI [name] is a 32 y.o. G5P4004 at [redacted]w[redacted]d who presents to MAU today with complaint of chest pain. The pain is worse after drinking soda. It was also worse yesterday after eating ice cream and a fried chicken sandwich from [employer] and ice cream. The patient is lactose intolerant. She tried Tums, which helped somewhat. When the pain got worse today she tried pepcid and felt some relief. While at work today she smelled bleach cleaning supplies and felt her symptoms return. The pain is described as sharp with inhalation and is located behind the sternum. Patient also reports discomfort in the LUQ. She has had nausea without vomiting since she found out she was pregnant. She denies vagina bleeding, abdominal pain, abnormal discharge or fever.   OB History   Grav Para Term Preterm Abortions TAB SAB Ect Mult Living   5 4 4  0 0 0 0 0 0 4      Past Medical History  Diagnosis Date  . Heart murmur   . Anemia     due to heavy menses  . Restless legs syndrome (RLS)   . Urinary tract infection, site not specified   . Absence of menstruation   . Contact dermatitis and other eczema, due to unspecified cause   . Anxiety   . Depression     Past Surgical History  Procedure Laterality Date  . Wisdom tooth extraction      Family History  Problem Relation Age of Onset  . Hypertension Father   . Heart disease Maternal Grandmother   . Stroke Maternal Grandmother   . Hypertension Maternal Grandmother     History  Substance Use Topics  . Smoking status: Never Smoker   . Smokeless tobacco: Never Used  . Alcohol Use: No    Allergies:  Allergies  Allergen Reactions  . Latex Hives  . Nickel Dermatitis    trigger    Prescriptions prior to  admission  Medication Sig Dispense Refill  . Multiple Vitamin (MULITIVITAMIN WITH MINERALS) TABS Take 1 tablet by mouth daily.      . naproxen sodium (ANAPROX DS) 550 MG tablet Take 1 tablet (550 mg total) by mouth 2 (two) times daily with a meal.  15 tablet  0  . ferrous sulfate 325 (65 FE) MG tablet Take 325 mg by mouth daily.      . fluconazole (DIFLUCAN) 150 MG tablet Take 1 tablet (150 mg total) by mouth once.  1 tablet  0  . phenazopyridine (PYRIDIUM) 200 MG tablet Take 1 tablet (200 mg total) by mouth 3 (three) times daily as needed for pain.  6 tablet  0  . sulfamethoxazole-trimethoprim (BACTRIM DS,SEPTRA DS) 800-160 MG per tablet Take 1 tablet by mouth 2 (two) times daily.  6 tablet  0    Review of Systems  Constitutional: Negative for fever, chills and malaise/fatigue.  Cardiovascular: Positive for chest pain.  Gastrointestinal: Positive for heartburn and nausea. Negative for vomiting and abdominal pain.  Genitourinary: Negative for dysuria, urgency and frequency.       Neg - vaginal bleeding Neg - abnormal discharge   Physical Exam   Blood pressure 125/72, last menstrual period 11/06/2012, SpO2 100.00%.  Physical Exam  Constitutional: She is oriented to person, place, and time. She appears well-developed and well-nourished. No distress.  HENT:  Head: Normocephalic and atraumatic.  Cardiovascular: Normal rate, regular rhythm and normal heart sounds.   Respiratory: Effort normal and breath sounds normal. No respiratory distress.  GI: Soft. Bowel sounds are normal. She exhibits no distension and no mass. There is no tenderness. There is no rebound and no guarding.  Neurological: She is alert and oriented to person, place, and time.  Skin: Skin is warm and dry. No erythema.  Psychiatric: She has a normal mood and affect.   MAU Course  Procedures None  MDM Pain relieved by pepcid, tums and made worse by acidic or fried foods. GI cocktail given in MAU - patient reports  improvement in symptoms.   Assessment and Plan  A: GERD Nausea in early pregnancy  P: Discharge home Rx for prevacid, phenergan and zofran sent to patient's pharmacy Education on GERD diet and foods to avoid given on AVS Patient to keep scheduled appointment in [facility] clinic for routine prenatal care Patient may return to MAU as needed or if her condition were to change or worsen  Freddi Starr, PA-C  01/04/2013, 11:28 PM

## 2013-01-04 NOTE — MAU Note (Signed)
Pain started yesterday in upper abd and then moved to LL chest area. Moves around. Took pepcid complete and helped alittle as did tums.

## 2013-01-05 MED ORDER — ONDANSETRON HCL 4 MG PO TABS: 4.0000 mg | ORAL_TABLET | Freq: Four times a day (QID) | ORAL | Status: DC

## 2013-01-05 MED ORDER — PROMETHAZINE HCL 25 MG PO TABS: 25.0000 mg | ORAL_TABLET | Freq: Four times a day (QID) | ORAL | Status: DC | PRN

## 2013-01-05 MED ORDER — LANSOPRAZOLE 15 MG PO CPDR: 15.0000 mg | DELAYED_RELEASE_CAPSULE | Freq: Every day | ORAL | Status: DC

## 2013-01-05 NOTE — Progress Notes (Signed)
Written and verbal d/c instructions given and understanding voiced. Chest pressure gone but still has some pain L upper abd.

## 2013-01-06 ENCOUNTER — Inpatient Hospital Stay (HOSPITAL_COMMUNITY)
Admission: AD | Admit: 2013-01-06 | Discharge: 2013-01-06 | Disposition: A | Discharge status: Home / Self Care | Payer: BC Managed Care – PPO | Source: Ambulatory Visit | Attending: Obstetrics & Gynecology | Admitting: Obstetrics & Gynecology

## 2013-01-06 ENCOUNTER — Encounter (HOSPITAL_COMMUNITY): Payer: Self-pay | Admitting: Advanced Practice Midwife

## 2013-01-06 ENCOUNTER — Inpatient Hospital Stay (HOSPITAL_COMMUNITY): Payer: BC Managed Care – PPO

## 2013-01-06 DIAGNOSIS — M25519 Pain in unspecified shoulder: Secondary | ICD-10-CM | POA: Insufficient documentation

## 2013-01-06 DIAGNOSIS — R0602 Shortness of breath: Secondary | ICD-10-CM | POA: Insufficient documentation

## 2013-01-06 DIAGNOSIS — O219 Vomiting of pregnancy, unspecified: Secondary | ICD-10-CM

## 2013-01-06 DIAGNOSIS — M549 Dorsalgia, unspecified: Secondary | ICD-10-CM | POA: Insufficient documentation

## 2013-01-06 DIAGNOSIS — M25512 Pain in left shoulder: Secondary | ICD-10-CM

## 2013-01-06 DIAGNOSIS — O99019 Anemia complicating pregnancy, unspecified trimester: Secondary | ICD-10-CM | POA: Insufficient documentation

## 2013-01-06 DIAGNOSIS — M542 Cervicalgia: Secondary | ICD-10-CM

## 2013-01-06 DIAGNOSIS — O99891 Other specified diseases and conditions complicating pregnancy: Secondary | ICD-10-CM | POA: Insufficient documentation

## 2013-01-06 DIAGNOSIS — D649 Anemia, unspecified: Secondary | ICD-10-CM

## 2013-01-06 LAB — CBC WITH DIFFERENTIAL/PLATELET
Basophils Absolute: 0 10*3/uL (ref 0.0–0.1)
Eosinophils Relative: 0 % (ref 0–5)
Lymphocytes Relative: 16 % (ref 12–46)
Monocytes Relative: 6 % (ref 3–12)
Neutrophils Relative %: 78 % — ABNORMAL HIGH (ref 43–77)
Platelets: 247 10*3/uL (ref 150–400)
RBC: 4.77 MIL/uL (ref 3.87–5.11)
RDW: 13.5 % (ref 11.5–15.5)
WBC: 12.6 10*3/uL — ABNORMAL HIGH (ref 4.0–10.5)

## 2013-01-06 LAB — URINE MICROSCOPIC-ADD ON

## 2013-01-06 LAB — URINALYSIS, ROUTINE W REFLEX MICROSCOPIC
Bilirubin Urine: NEGATIVE
Glucose, UA: NEGATIVE mg/dL
Specific Gravity, Urine: 1.025 (ref 1.005–1.030)
pH: 6 (ref 5.0–8.0)

## 2013-01-06 LAB — WET PREP, GENITAL
Trich, Wet Prep: NONE SEEN
Yeast Wet Prep HPF POC: NONE SEEN

## 2013-01-06 NOTE — MAU Provider Note (Signed)
History     CSN: [id number]  Arrival date and time: 01/06/13 1723   First Provider Initiated Contact with Patient 01/06/13 1834      Chief Complaint  Patient presents with  . Neck Pain  . Back Pain   HPI Holly Vazquez is 32 y.o. G5P4004 [redacted]w[redacted]d weeks presenting with complants of left sided neck pain, back pain and difficulty breathing.  Decreased appetite with nausea.  Denies vomiting.   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  GI cocktail and Prevacid given--helped a little but still had left sided discomfort that was helped with tylenol.  She read the internet so now she is concerned about kidney stones and an ectopic pregnancy.  She complains of fatigue.  [facility]  Has both Zofran and phenergan for nausea at home.    Past Medical History  Diagnosis Date  . Heart murmur   . Anemia     due to heavy menses  . Restless legs syndrome (RLS)   . Urinary tract infection, site not specified   . Absence of menstruation   . Contact dermatitis and other eczema, due to unspecified cause   . Anxiety   . Depression     Past Surgical History  Procedure Laterality Date  . Wisdom tooth extraction      Family History  Problem Relation Age of Onset  . Hypertension Father   . Heart disease Maternal Grandmother   . Stroke Maternal Grandmother   . Hypertension Maternal Grandmother     History  Substance Use Topics  . Smoking status: Never Smoker   . Smokeless tobacco: Never Used  . Alcohol Use: No    Allergies:  Allergies  Allergen Reactions  . Latex Hives  . Nickel Dermatitis    trigger    Prescriptions prior to admission  Medication Sig Dispense Refill  . Iron-Vitamin C (VITRON-C) 65-125 MG TABS Take by mouth.      . lansoprazole (PREVACID) 15 MG capsule Take 1 capsule (15 mg total) by mouth daily.  30 capsule  1  . Multiple Vitamin (MULITIVITAMIN WITH MINERALS) TABS  Take 1 tablet by mouth daily.      . [DISCONTINUED] ondansetron (ZOFRAN) 4 MG tablet Take 1 tablet (4 mg total) by mouth every 6 (six) hours.  12 tablet  0  . [DISCONTINUED] promethazine (PHENERGAN) 25 MG tablet Take 1 tablet (25 mg total) by mouth every 6 (six) hours as needed for nausea.  30 tablet  0    Review of Systems  Constitutional: Negative for fever and chills.  HENT: Positive for neck pain.   Respiratory: Positive for shortness of breath.   Gastrointestinal: Positive for nausea. Negative for abdominal pain.  Genitourinary: Positive for frequency. Negative for dysuria and hematuria.       Neg for vaginal discharge or bleeding.   Musculoskeletal: Positive for back pain.       + for left neck and shoulder pain.   Physical Exam   Blood pressure 128/70, pulse 92, temperature 98.9 F (37.2 C), temperature source Oral, resp. rate 16, height 5\' 3"  (1.6 m), weight 168 lb (76.204 kg), last menstrual period 11/06/2012, SpO2 100.00%.  Physical Exam  Constitutional: She appears well-developed and well-nourished. No distress.  HENT:  Head: Normocephalic.  Neck: Normal range of motion.  Cardiovascular: Normal rate.   Respiratory: Effort normal.  GI: Soft. She exhibits no distension and no mass. There is  no tenderness. There is no rebound and no guarding.  Genitourinary: There is no rash, tenderness or lesion on the right labia. There is no rash, tenderness or lesion on the left labia. Uterus is enlarged (8 week size). Uterus is not tender. Cervix exhibits no discharge and no friability. Right adnexum displays no mass, no tenderness and no fullness. Left adnexum displays no mass, no tenderness and no fullness. No bleeding around the vagina. Vaginal discharge (small amount of white discharge without odor) found.  Musculoskeletal:       Left shoulder: She exhibits tenderness. She exhibits normal range of motion, no bony tenderness, no swelling and no pain.   Results for orders placed during  the hospital encounter of 01/06/13 (from the past 24 hour(s))  URINALYSIS, ROUTINE W REFLEX MICROSCOPIC     Status: Abnormal   Collection Time    01/06/13  6:00 PM      Result Value Range   Color, Urine YELLOW  YELLOW   APPearance CLEAR  CLEAR   Specific Gravity, Urine 1.025  1.005 - 1.030   pH 6.0  5.0 - 8.0   Glucose, UA NEGATIVE  NEGATIVE mg/dL   Hgb urine dipstick TRACE (*) NEGATIVE   Bilirubin Urine NEGATIVE  NEGATIVE   Ketones, ur >80 (*) NEGATIVE mg/dL   Protein, ur NEGATIVE  NEGATIVE mg/dL   Urobilinogen, UA 0.2  0.0 - 1.0 mg/dL   Nitrite NEGATIVE  NEGATIVE   Leukocytes, UA NEGATIVE  NEGATIVE  URINE MICROSCOPIC-ADD ON     Status: Abnormal   Collection Time    01/06/13  6:00 PM      Result Value Range   Squamous Epithelial / LPF FEW (*) RARE   WBC, UA 0-2  <3 WBC/hpf   RBC / HPF 3-6  <3 RBC/hpf   Bacteria, UA FEW (*) RARE   Urine-Other MUCOUS PRESENT    CBC WITH DIFFERENTIAL     Status: Abnormal (Preliminary result)   Collection Time    01/06/13  6:50 PM      Result Value Range   WBC 12.6 (*) 4.0 - 10.5 K/uL   RBC 4.77  3.87 - 5.11 MIL/uL   Hemoglobin 10.6 (*) 12.0 - 15.0 g/dL   HCT 45.4 (*) 09.8 - 11.9 %   MCV 70.2 (*) 78.0 - 100.0 fL   MCH 22.2 (*) 26.0 - 34.0 pg   MCHC 31.6  30.0 - 36.0 g/dL   RDW 14.7  82.9 - 56.2 %   Platelets 247  150 - 400 K/uL   Neutrophils Relative PENDING  43 - 77 %   Neutro Abs PENDING  1.7 - 7.7 K/uL   Band Neutrophils PENDING  0 - 10 %   Lymphocytes Relative PENDING  12 - 46 %   Lymphs Abs PENDING  0.7 - 4.0 K/uL   Monocytes Relative PENDING  3 - 12 %   Monocytes Absolute PENDING  0.1 - 1.0 K/uL   Eosinophils Relative PENDING  0 - 5 %   Eosinophils Absolute PENDING  0.0 - 0.7 K/uL   Basophils Relative PENDING  0 - 1 %   Basophils Absolute PENDING  0.0 - 0.1 K/uL   WBC Morphology PENDING     RBC Morphology PENDING     Smear Review PENDING     nRBC PENDING  0 /100 WBC   Metamyelocytes Relative PENDING     Myelocytes PENDING      Promyelocytes Absolute PENDING     Blasts PENDING  BLOOD TYPE FROM PREVIOUS VISIT IS B POSITIVE MAU Course  Procedures  MDM I discussed the lab results and the physical exam findings with the patient.  Her O2Sats are wnl.  Her pelvic exam was benign.  She has an appt for 4/2 in [facility]  Assessment and Plan  A: Left sided neck, shoulder pain    Shortness of breath     Early pregnancy [redacted]w[redacted]d gestation    Anemia  P:  Discussed labs and physical findings with the patient     She was given time to ask questions     Tylenol prn for shoulder/neck discomfort and meds she has at home for nausea    Take prenatal vitamins qd    [facility]    Return to MAU for worsening sxs   Gentle Hoge,EVE M 01/06/2013, 7:31 PM

## 2013-01-06 NOTE — MAU Note (Signed)
Patient states she has been having pain that starts in the left side of her neck, radiating down her shoulder into the left side and lower left abdomen and back. No bleeding or discharge.

## 2013-01-06 NOTE — MAU Note (Signed)
Patient is in with c/o right lateral neck pain, she is also concerned about chemical fumes at work. 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 She states that she have feelings that she can't take in a deep breath. Patient does not appear to be in respiratory distress. She denies vaginal bleeding or abdominal pain.

## 2013-01-07 LAB — GC/CHLAMYDIA PROBE AMP: CT Probe RNA: NEGATIVE

## 2013-01-09 NOTE — MAU Provider Note (Signed)
Attestation of Attending Supervision of Advanced Practitioner (CNM/NP): Evaluation and management procedures were performed by the Advanced Practitioner under my supervision and collaboration. I have reviewed the Advanced Practitioner's note and chart, and I agree with the management and plan.  Adolfo Granieri H. 3:30 PM   

## 2013-01-15 ENCOUNTER — Encounter: Payer: Self-pay | Admitting: Family Medicine

## 2013-01-15 ENCOUNTER — Ambulatory Visit (INDEPENDENT_AMBULATORY_CARE_PROVIDER_SITE_OTHER): Payer: BC Managed Care – PPO | Admitting: Family Medicine

## 2013-01-15 VITALS — BP 104/65 | Temp 98.9°F | Wt 167.0 lb

## 2013-01-15 DIAGNOSIS — Z3480 Encounter for supervision of other normal pregnancy, unspecified trimester: Secondary | ICD-10-CM | POA: Insufficient documentation

## 2013-01-15 DIAGNOSIS — Z3481 Encounter for supervision of other normal pregnancy, first trimester: Secondary | ICD-10-CM

## 2013-01-15 LAB — POCT URINALYSIS DIP (DEVICE)
Bilirubin Urine: NEGATIVE
Hgb urine dipstick: NEGATIVE
Nitrite: NEGATIVE
Protein, ur: 30 mg/dL — AB
Urobilinogen, UA: 1 mg/dL (ref 0.0–1.0)
pH: 7 (ref 5.0–8.0)

## 2013-01-15 MED ORDER — PRENATAL VITAMINS (DIS) PO TABS: 1.0000 | ORAL_TABLET | Freq: Every day | ORAL | Status: DC

## 2013-01-15 NOTE — Progress Notes (Signed)
Pulse 71 Vaginal d/c stated as "golden"; no itch, no odor.

## 2013-01-15 NOTE — Patient Instructions (Addendum)
Pregnancy - First Trimester During sexual intercourse, millions of sperm go into the vagina. Only 1 sperm will penetrate and fertilize the female egg while it is in the Fallopian tube. One week later, the fertilized egg implants into the wall of the uterus. An embryo begins to develop into a baby. At 6 to 8 weeks, the eyes and face are formed and the heartbeat can be seen on ultrasound. At the end of 12 weeks (first trimester), all the baby's organs are formed. Now that you are pregnant, you will want to do everything you can to have a healthy baby. Two of the most important things are to get good prenatal care and follow your caregiver's instructions. Prenatal care is all the medical care you receive before the baby's birth. It is given to prevent, find, and treat problems during the pregnancy and childbirth. PRENATAL EXAMS  During prenatal visits, your weight, blood pressure and urine are checked. This is done to make sure you are healthy and progressing normally during the pregnancy.  A pregnant woman should gain 25 to 35 pounds during the pregnancy. However, if you are over weight or underweight, your caregiver will advise you regarding your weight.  Your caregiver will ask and answer questions for you.  Blood work, cervical cultures, other necessary tests and a Pap test are done during your prenatal exams. These tests are done to check on your health and the probable health of your baby. Tests are strongly recommended and done for HIV with your permission. This is the virus that causes AIDS. These tests are done because medications can be given to help prevent your baby from being born with this infection should you have been infected without knowing it. Blood work is also used to find out your blood type, previous infections and follow your blood levels (hemoglobin).  Low hemoglobin (anemia) is common during pregnancy. Iron and vitamins are given to help prevent this. Later in the pregnancy, blood  tests for diabetes will be done along with any other tests if any problems develop. You may need tests to make sure you and the baby are doing well.  You may need other tests to make sure you and the baby are doing well. CHANGES DURING THE FIRST TRIMESTER (THE FIRST 3 MONTHS OF PREGNANCY) Your body goes through many changes during pregnancy. They vary from person to person. Talk to your caregiver about changes you notice and are concerned about. Changes can include:  Your menstrual period stops.  The egg and sperm carry the genes that determine what you look like. Genes from you and your partner are forming a baby. The female genes determine whether the baby is a boy or a girl.  Your body increases in girth and you may feel bloated.  Feeling sick to your stomach (nauseous) and throwing up (vomiting). If the vomiting is uncontrollable, call your caregiver.  Your breasts will begin to enlarge and become tender.  Your nipples may stick out more and become darker.  The need to urinate more. Painful urination may mean you have a bladder infection.  Tiring easily.  Loss of appetite.  Cravings for certain kinds of food.  At first, you may gain or lose a couple of pounds.  You may have changes in your emotions from day to day (excited to be pregnant or concerned something may go wrong with the pregnancy and baby).  You may have more vivid and strange dreams. HOME CARE INSTRUCTIONS   It is very important   to avoid all smoking, alcohol and un-prescribed drugs during your pregnancy. These affect the formation and growth of the baby. Avoid chemicals while pregnant to ensure the delivery of a healthy infant.  Start your prenatal visits by the 12th week of pregnancy. They are usually scheduled monthly at first, then more often in the last 2 months before delivery. Keep your caregiver's appointments. Follow your caregiver's instructions regarding medication use, blood and lab tests, exercise, and  diet.  During pregnancy, you are providing food for you and your baby. Eat regular, well-balanced meals. Choose foods such as meat, fish, milk and other low fat dairy products, vegetables, fruits, and whole-grain breads and cereals. Your caregiver will tell you of the ideal weight gain.  You can help morning sickness by keeping soda crackers at the bedside. Eat a couple before arising in the morning. You may want to use the crackers without salt on them.  Eating 4 to 5 small meals rather than 3 large meals a day also may help the nausea and vomiting.  Drinking liquids between meals instead of during meals also seems to help nausea and vomiting.  A physical sexual relationship may be continued throughout pregnancy if there are no other problems. Problems may be early (premature) leaking of amniotic fluid from the membranes, vaginal bleeding, or belly (abdominal) pain.  Exercise regularly if there are no restrictions. Check with your caregiver or physical therapist if you are unsure of the safety of some of your exercises. Greater weight gain will occur in the last 2 trimesters of pregnancy. Exercising will help:  Control your weight.  Keep you in shape.  Prepare you for labor and delivery.  Help you lose your pregnancy weight after you deliver your baby.  Wear a good support or jogging bra for breast tenderness during pregnancy. This may help if worn during sleep too.  Ask when prenatal classes are available. Begin classes when they are offered.  Do not use hot tubs, steam rooms or saunas.  Wear your seat belt when driving. This protects you and your baby if you are in an accident.  Avoid raw meat, uncooked cheese, cat litter boxes and soil used by cats throughout the pregnancy. These carry germs that can cause birth defects in the baby.  The first trimester is a good time to visit your dentist for your dental health. Getting your teeth cleaned is OK. Use a softer toothbrush and brush  gently during pregnancy.  Ask for help if you have financial, counseling or nutritional needs during pregnancy. Your caregiver will be able to offer counseling for these needs as well as refer you for other special needs.  Do not take any medications or herbs unless told by your caregiver.  Inform your caregiver if there is any mental or physical domestic violence.  Make a list of emergency phone numbers of family, friends, hospital, and police and fire departments.  Write down your questions. Take them to your prenatal visit.  Do not douche.  Do not cross your legs.  If you have to stand for long periods of time, rotate you feet or take small steps in a circle.  You may have more vaginal secretions that may require a sanitary pad. Do not use tampons or scented sanitary pads. MEDICATIONS AND DRUG USE IN PREGNANCY  Take prenatal vitamins as directed. The vitamin should contain 1 milligram of folic acid. Keep all vitamins out of reach of children. Only a couple vitamins or tablets containing iron may be   fatal to a baby or young child when ingested.  Avoid use of all medications, including herbs, over-the-counter medications, not prescribed or suggested by your caregiver. Only take over-the-counter or prescription medicines for pain, discomfort, or fever as directed by your caregiver. Do not use aspirin, ibuprofen, or naproxen unless directed by your caregiver.  Let your caregiver also know about herbs you may be using.  Alcohol is related to a number of birth defects. This includes fetal alcohol syndrome. All alcohol, in any form, should be avoided completely. Smoking will cause low birth rate and premature babies.  Street or illegal drugs are very harmful to the baby. They are absolutely forbidden. A baby born to an addicted mother will be addicted at birth. The baby will go through the same withdrawal an adult does.  Let your caregiver know about any medications that you have to take  and for what reason you take them. MISCARRIAGE IS COMMON DURING PREGNANCY A miscarriage does not mean you did something wrong. It is not a reason to worry about getting pregnant again. Your caregiver will help you with questions you may have. If you have a miscarriage, you may need minor surgery. SEEK MEDICAL CARE IF:  You have any concerns or worries during your pregnancy. It is better to call with your questions if you feel they cannot wait, rather than worry about them. SEEK IMMEDIATE MEDICAL CARE IF:   An unexplained oral temperature above 102 F (38.9 C) develops, or as your caregiver suggests.  You have leaking of fluid from the vagina (birth canal). If leaking membranes are suspected, take your temperature and inform your caregiver of this when you call.  There is vaginal spotting or bleeding. Notify your caregiver of the amount and how many pads are used.  You develop a bad smelling vaginal discharge with a change in the color.  You continue to feel sick to your stomach (nauseated) and have no relief from remedies suggested. You vomit blood or coffee ground-like materials.  You lose more than 2 pounds of weight in 1 week.  You gain more than 2 pounds of weight in 1 week and you notice swelling of your face, hands, feet, or legs.  You gain 5 pounds or more in 1 week (even if you do not have swelling of your hands, face, legs, or feet).  You get exposed to German measles and have never had them.  You are exposed to fifth disease or chickenpox.  You develop belly (abdominal) pain. Round ligament discomfort is a common non-cancerous (benign) cause of abdominal pain in pregnancy. Your caregiver still must evaluate this.  You develop headache, fever, diarrhea, pain with urination, or shortness of breath.  You fall or are in a car accident or have any kind of trauma.  There is mental or physical violence in your home. Document Released: 09/26/2001 Document Revised: 12/25/2011  Document Reviewed: 03/30/2009 [employer] Patient Information 2013 [employer].  

## 2013-01-15 NOTE — Progress Notes (Signed)
  Subjective:    Holly Vazquez is being seen today for her first obstetrical visit.  This is not a planned pregnancy. She is at [redacted]w[redacted]d gestation by LMP. Her obstetrical history is significant for none. Relationship with FOB: spouse, living together. Patient does intend to breast feed. Pregnancy history fully reviewed.  Menstrual History: OB History   Grav Para Term Preterm Abortions TAB SAB Ect Mult Living   5 4 4  0 0 0 0 0 0 4       Patient's last menstrual period was 11/06/2012. Not definite - may be off by a few days.     The following portions of the patient's history were reviewed and updated as appropriate: allergies, current medications, past family history, past medical history, past social history, past surgical history and problem list.  Review of Systems Constitutional: negative for chills, fevers and sweats Eyes: negative for visual disturbance Respiratory: negative for asthma, cough and wheezing Cardiovascular: negative for chest pain, dyspnea and palpitations Gastrointestinal: negative for abdominal pain, constipation, diarrhea, nausea and vomiting Genitourinary:negative for dysuria and vaginal discharge Neurological: negative for dizziness and headaches    Objective:    BP 104/65  Temp(Src) 98.9 F (37.2 C)  Wt 167 lb (75.751 kg)  BMI 29.59 kg/m2  LMP 11/06/2012 General appearance: alert, cooperative and no distress Head: Normocephalic, without obvious abnormality, atraumatic Eyes: conjunctivae/corneas clear. PERRL, EOM's intact. Fundi benign. Neck: no adenopathy, supple, symmetrical, trachea midline and no thyromegaly Lungs: clear to auscultation bilaterally Heart: regular rate and rhythm, S1, S2 normal, no murmur, click, rub or gallop Abdomen: non-tender, normal bowel sounds, soft Pelvic: deferred and done in MAU 3/24 Extremities: extremities normal, atraumatic, no cyanosis or edema Neurologic: Grossly normal    Assessment:    Pregnancy at 10 and 0/7  weeks  Clue cells on wet prep 01/06/13.   Plan:    Initial labs drawn. Prenatal vitamins. Problem list reviewed and updated. AFP3 discussed: declined. Role of ultrasound in pregnancy discussed; fetal survey: requested. Amniocentesis discussed: not indicated. BV:  Treatment deferred as asymptomatic. Follow up in 4 weeks. 50% of 45 min visit spent on counseling and coordination of care.

## 2013-01-16 LAB — OBSTETRIC PANEL
Antibody Screen: NEGATIVE
Basophils Absolute: 0 10*3/uL (ref 0.0–0.1)
Basophils Relative: 0 % (ref 0–1)
Eosinophils Absolute: 0.1 10*3/uL (ref 0.0–0.7)
Hemoglobin: 10.6 g/dL — ABNORMAL LOW (ref 12.0–15.0)
Hepatitis B Surface Ag: NEGATIVE
MCH: 22.1 pg — ABNORMAL LOW (ref 26.0–34.0)
MCHC: 31.9 g/dL (ref 30.0–36.0)
Monocytes Relative: 6 % (ref 3–12)
Neutrophils Relative %: 74 % (ref 43–77)
RDW: 14.8 % (ref 11.5–15.5)
Rh Type: POSITIVE

## 2013-01-22 ENCOUNTER — Encounter: Payer: Self-pay | Admitting: Family Medicine

## 2013-01-22 ENCOUNTER — Other Ambulatory Visit: Payer: Self-pay | Admitting: Family Medicine

## 2013-01-22 ENCOUNTER — Ambulatory Visit (HOSPITAL_COMMUNITY)
Admission: RE | Admit: 2013-01-22 | Discharge: 2013-01-22 | Disposition: A | Discharge status: Home / Self Care | Payer: BC Managed Care – PPO | Source: Ambulatory Visit | Attending: Family Medicine | Admitting: Family Medicine

## 2013-01-22 DIAGNOSIS — Z3481 Encounter for supervision of other normal pregnancy, first trimester: Secondary | ICD-10-CM

## 2013-01-22 DIAGNOSIS — O3680X0 Pregnancy with inconclusive fetal viability, not applicable or unspecified: Secondary | ICD-10-CM | POA: Insufficient documentation

## 2013-01-22 DIAGNOSIS — O30009 Twin pregnancy, unspecified number of placenta and unspecified number of amniotic sacs, unspecified trimester: Secondary | ICD-10-CM | POA: Insufficient documentation

## 2013-01-22 DIAGNOSIS — Z3689 Encounter for other specified antenatal screening: Secondary | ICD-10-CM | POA: Insufficient documentation

## 2013-01-24 ENCOUNTER — Inpatient Hospital Stay (HOSPITAL_COMMUNITY)
Admission: AD | Admit: 2013-01-24 | Discharge: 2013-01-25 | Disposition: A | Discharge status: Home / Self Care | Payer: BC Managed Care – PPO | Source: Ambulatory Visit | Attending: Obstetrics and Gynecology | Admitting: Obstetrics and Gynecology

## 2013-01-24 DIAGNOSIS — O418X10 Other specified disorders of amniotic fluid and membranes, first trimester, not applicable or unspecified: Secondary | ICD-10-CM

## 2013-01-24 DIAGNOSIS — O209 Hemorrhage in early pregnancy, unspecified: Secondary | ICD-10-CM | POA: Insufficient documentation

## 2013-01-24 DIAGNOSIS — O418X90 Other specified disorders of amniotic fluid and membranes, unspecified trimester, not applicable or unspecified: Secondary | ICD-10-CM

## 2013-01-24 DIAGNOSIS — O468X9 Other antepartum hemorrhage, unspecified trimester: Secondary | ICD-10-CM

## 2013-01-24 NOTE — MAU Note (Signed)
Saw bright blood on tissue about 2240 after having intercourse. No bleeding since.

## 2013-01-25 ENCOUNTER — Encounter (HOSPITAL_COMMUNITY): Payer: Self-pay | Admitting: *Deleted

## 2013-01-25 ENCOUNTER — Inpatient Hospital Stay (HOSPITAL_COMMUNITY): Payer: BC Managed Care – PPO

## 2013-01-25 DIAGNOSIS — O468X9 Other antepartum hemorrhage, unspecified trimester: Secondary | ICD-10-CM

## 2013-01-25 DIAGNOSIS — O418X90 Other specified disorders of amniotic fluid and membranes, unspecified trimester, not applicable or unspecified: Secondary | ICD-10-CM

## 2013-01-25 LAB — URINALYSIS, ROUTINE W REFLEX MICROSCOPIC
Glucose, UA: NEGATIVE mg/dL
Leukocytes, UA: NEGATIVE
Protein, ur: NEGATIVE mg/dL
Urobilinogen, UA: 0.2 mg/dL (ref 0.0–1.0)

## 2013-01-25 NOTE — MAU Provider Note (Signed)
Chief Complaint: Vaginal Bleeding   First Provider Initiated Contact with Patient 01/25/13 0102     SUBJECTIVE HPI: Holly Vazquez is a 32 y.o. G5P4004 at [redacted]w[redacted]d by LMP who presents with Saw bright blood on tissue about 2240 after having intercourse. No bleeding since. Known di/di twin IUP. GC/CT neg 01/06/13. Denies abd pain, vaginal discharge, passage of clots or tissue.   Past Medical History  Diagnosis Date  . Restless legs syndrome (RLS)   . Urinary tract infection, site not specified   . Absence of menstruation   . Contact dermatitis and other eczema, due to unspecified cause   . Anxiety   . Depression   . Heart murmur     States always told it was from anemia  . Anemia     due to heavy menses, on iron       OB History   Grav Para Term Preterm Abortions TAB SAB Ect Mult Living   5 4 4  0 0 0 0 0 0 4     # Outc Date GA Lbr Len/2nd Wgt Sex Del Anes PTL Lv   1 TRM 6/04   3.005kg(6lb10oz)  SVD      2 TRM 12/06   3.204kg(7lb1oz)  SVD      3 TRM 4/11   3.062kg(6lb12oz)  SVD      4 TRM 10/12 [redacted]w[redacted]d 08:02 / 00:07 3.58kg(7lb14.3oz) M SVD None  Yes   5 CUR              Past Surgical History  Procedure Laterality Date  . Wisdom tooth extraction     History   Social History  . Marital Status: Married    Spouse Name: N/A    Number of Children: N/A  . Years of Education: N/A   Occupational History  . Not on file.   Social History Main Topics  . Smoking status: Never Smoker   . Smokeless tobacco: Never Used  . Alcohol Use: No  . Drug Use: No  . Sexually Active: Yes    Birth Control/ Protection: Condom   Other Topics Concern  . Not on file   Social History Narrative  . No narrative on file   No current facility-administered medications on file prior to encounter.   Current Outpatient Prescriptions on File Prior to Encounter  Medication Sig Dispense Refill  . Iron-Vitamin C (VITRON-C) 65-125 MG TABS Take by mouth.      . lansoprazole (PREVACID) 15 MG capsule  Take 1 capsule (15 mg total) by mouth daily.  30 capsule  1  . Multiple Vitamin (MULITIVITAMIN WITH MINERALS) TABS Take 1 tablet by mouth daily.      . Prenatal Vitamins (DIS) TABS Take 1 tablet by mouth daily.  30 tablet  11   Allergies  Allergen Reactions  . Latex Hives  . Nickel Dermatitis    trigger    ROS: Pertinent items in HPI  OBJECTIVE Blood pressure 124/62, pulse 76, resp. rate 20, height 5\' 4"  (1.626 m), weight 76.114 kg (167 lb 12.8 oz), last menstrual period 11/06/2012, SpO2 100.00%. GENERAL: Well-developed, well-nourished female in no acute distress.  HEENT: Normocephalic HEART: normal rate RESP: normal effort ABDOMEN: Soft, non-tender EXTREMITIES: Nontender, no edema NEURO: Alert and oriented SPECULUM EXAM: NEFG, physiologic discharge, small amount of bright red blood noted, cervix clean BIMANUAL: cervix closed; uterus 14 week size, no adnexal tenderness or masses  LAB RESULTS Results for orders placed during the hospital encounter of 01/24/13 (from the past 24  hour(s))  URINALYSIS, ROUTINE W REFLEX MICROSCOPIC     Status: None   Collection Time    01/25/13 12:15 AM      Result Value Range   Color, Urine YELLOW  YELLOW   APPearance CLEAR  CLEAR   Specific Gravity, Urine 1.010  1.005 - 1.030   pH 6.0  5.0 - 8.0   Glucose, UA NEGATIVE  NEGATIVE mg/dL   Hgb urine dipstick NEGATIVE  NEGATIVE   Bilirubin Urine NEGATIVE  NEGATIVE   Ketones, ur NEGATIVE  NEGATIVE mg/dL   Protein, ur NEGATIVE  NEGATIVE mg/dL   Urobilinogen, UA 0.2  0.0 - 1.0 mg/dL   Nitrite NEGATIVE  NEGATIVE   Leukocytes, UA NEGATIVE  NEGATIVE    IMAGING Small SCH FHR Baby A: 172 FHR Baby B: 155 Small SCH  MAU COURSE  ASSESSMENT 1. Subchorionic bleed, first trimester    PLAN Discharge home Pelvic rest, bleeding precautions.    Medication List    TAKE these medications       lansoprazole 15 MG capsule  Commonly known as:  PREVACID  Take 1 capsule (15 mg total) by mouth daily.      multivitamin with minerals Tabs  Take 1 tablet by mouth daily.     Prenatal Vitamins (DIS) Tabs  Take 1 tablet by mouth daily.     VITRON-C 65-125 MG Tabs  Generic drug:  Iron-Vitamin C  Take by mouth.       Follow-up Information   Follow up with [facility] On 02/12/2013. (as scheduled)    Contact information:   [address] [place] [address] [phone]      Follow up with [facility] [place] MATERNITY ADMISSIONS. (As needed if symptoms worsen)    Contact information:   [address] [place] [address] [phone]     Dorathy Kinsman, [place] 01/25/2013  2:12 AM

## 2013-01-29 NOTE — MAU Provider Note (Signed)
Attestation of Attending Supervision of Advanced Practitioner: Evaluation and management procedures were performed by the PA/NP/CNM/OB Fellow under my supervision/collaboration. Chart reviewed and agree with management and plan.  Pharrah Rottman V 01/29/2013 8:01 AM

## 2013-02-12 ENCOUNTER — Ambulatory Visit (INDEPENDENT_AMBULATORY_CARE_PROVIDER_SITE_OTHER): Payer: BC Managed Care – PPO | Admitting: Obstetrics and Gynecology

## 2013-02-12 ENCOUNTER — Other Ambulatory Visit: Payer: Self-pay | Admitting: Obstetrics and Gynecology

## 2013-02-12 VITALS — BP 118/69 | Wt 165.8 lb

## 2013-02-12 DIAGNOSIS — O30009 Twin pregnancy, unspecified number of placenta and unspecified number of amniotic sacs, unspecified trimester: Secondary | ICD-10-CM

## 2013-02-12 DIAGNOSIS — O30049 Twin pregnancy, dichorionic/diamniotic, unspecified trimester: Secondary | ICD-10-CM

## 2013-02-12 LAB — POCT URINALYSIS DIP (DEVICE)
Glucose, UA: NEGATIVE mg/dL
Nitrite: NEGATIVE
Urobilinogen, UA: 0.2 mg/dL (ref 0.0–1.0)

## 2013-02-12 NOTE — Progress Notes (Signed)
Informal [place] for FHT's- Twin A- transverse, 140, fetal movement seen; Twin B- breech, 143, fetal movement seen.  Separating membrane observed.

## 2013-02-12 NOTE — Patient Instructions (Signed)
Breastfeeding, Twins or Multiples Mothers of twins or multiples might feel overwhelmed with the idea of breastfeeding more than one baby at a time. It is easier and less expensive to breastfeed twins than to bottle feed them. This is because you do not need to buy infant formula, wash bottles, buy mild soap, and fill the bottles for more than one baby when it is time to feed them. Human milk is especially important for twins, who are often small at birth and need all the advantages breast milk can provide. Breastfeeding also helps create a unique and special bond between the mother and each of her infants.  Mothers of multiples get more benefits from breastfeeding:  Your uterus contracts and returns to its original size faster. This is helpful because it has stretched even more than normal to hold more than one baby.  Hormones are released that relax the mother. This is helpful with the added stress of caring for more than one infant.  The mother often finds she is saving herself time and money, because there is no need to prepare formula or bottles. Your milk is available whenever your babies are ready to feed, at the right temperature, providing optimal nutrition. If the babies are premature and unable to nurse, you can pump your breasts and freeze the milk until the babies are ready to feed at the breast. To stimulate a milk supply, your breasts need to be emptied at least 8 to 10 times in a 24 hour period. Ask a lactation specialist to help you choose an effective breast pump and to provide guidance in helping your babies latch onto, and feed from, the breast when they are ready. TO GET STARTED: Nurse as soon as possible after birth, and as often as the babies want to do so. This will stimulate your breasts to produce adequate amounts of milk. Mothers of twins almost always produce enough milk for both babies.  TIPS TO INCREASE SUCCESS:  Many mothers of multiples find it easiest to nurse the babies  together. However, if one of the infants is having difficulty latching or sucking, the mother may need to give that baby her full attention when it is time to feed.  Nursing two babies at the same time often gets easier as the babies get older and more experienced at latching onto the breast. Extra pillows under the mother's arms, legs, and under the babies can help this process.  Breastfeeding two babies at once may increase the mother's milk producing hormone (prolactin) levels, and boost her milk production. The more often the babies breastfeed effectively, the more milk the mother will produce.  Switch the babies from one side to the other at alternate feedings. For instance, if baby A feeds from the right breast and baby B feeds from the left breast, then at the next feeding, baby A should take the left breast and baby B the right breast. This ensures that both breasts get equal amounts of stimulation. It also uses the stronger sucking twin to increase the milk supply for the twin whose suck is weaker.  If one of the babies is having difficulty feeding, it may help to try breastfeeding him at the same time as his sibling. The baby with the stronger or more effective suck will stimulate the mother's milk to flow faster. This will encourage his twin to suck and swallow correctly.  It is important to avoid limiting the amount of time each baby spends feeding at the breast. This  allows both babies to obtain the fattier milk that is available at the end of the feeding, when the breast is emptier.  Avoiding bottles and pacifiers during the early weeks will encourage effective sucking patterns and help establish a good milk supply. You should not need supplements if you empty your breasts with each feeding.  A good latch for both infants is important in helping the babies empty the breast effectively, and for avoiding sore nipples. The most common cause of soreness is improper latch-on and  positioning.  In the early days, keep track of each infant's stools and wet diapers, to make sure each baby is getting enough milk. In the first 6 weeks, each baby should have 6 to 8 wet cloth diapers (5 to 6 disposable diapers) and 2 or more bowel movements per day.  There are several positions and holds that make it easier to nurse more than one baby at a time. Ask your nurse or lactation specialist to suggest tips on positioning.  If you know you are having twins, talk with a lactation consultant about more ways you can increase your success at breastfeeding. Document Released: 01/30/2005 Document Revised: 12/25/2011 Document Reviewed: 08/19/2009 [facility] Patient Information 2013 [place], [place].

## 2013-02-12 NOTE — Progress Notes (Signed)
Twins, di-di. Diane to check [facility]. Seen MAU spotting >dx [facility], still slight spotting. Asked about refraining from intercourse and reassured> decided to wait 1 wk after spotting stops. Had BV by wet prep and still declines tx since asymptomatic. Still declines genetic testing. Reviewed risks/testing protocols for twin gestation. Info on Mothers of Multiples.

## 2013-02-12 NOTE — Progress Notes (Signed)
Pulse: 82 Has some brown spotting from time to time, not a daily occurrence, none today. Found out that she is having twins!

## 2013-02-17 ENCOUNTER — Encounter: Payer: Self-pay | Admitting: Obstetrics and Gynecology

## 2013-03-06 ENCOUNTER — Encounter (HOSPITAL_COMMUNITY): Payer: Self-pay | Admitting: Family Medicine

## 2013-03-06 ENCOUNTER — Ambulatory Visit (INDEPENDENT_AMBULATORY_CARE_PROVIDER_SITE_OTHER): Payer: BC Managed Care – PPO | Admitting: Family Medicine

## 2013-03-06 VITALS — BP 113/74 | Wt 166.4 lb

## 2013-03-06 DIAGNOSIS — O30049 Twin pregnancy, dichorionic/diamniotic, unspecified trimester: Secondary | ICD-10-CM

## 2013-03-06 DIAGNOSIS — O30009 Twin pregnancy, unspecified number of placenta and unspecified number of amniotic sacs, unspecified trimester: Secondary | ICD-10-CM

## 2013-03-06 LAB — POCT URINALYSIS DIP (DEVICE)
Bilirubin Urine: NEGATIVE
Glucose, UA: NEGATIVE mg/dL
Nitrite: NEGATIVE

## 2013-03-06 NOTE — Patient Instructions (Signed)
Pregnancy - Second Trimester The second trimester of pregnancy (3 to 6 months) is a period of rapid growth for you and your baby. At the end of the sixth month, your baby is about 9 inches long and weighs 1 1/2 pounds. You will begin to feel the baby move between 18 and 20 weeks of the pregnancy. This is called quickening. Weight gain is faster. A clear fluid (colostrum) may leak out of your breasts. You may feel small contractions of the womb (uterus). This is known as false labor or Braxton-Hicks contractions. This is like a practice for labor when the baby is ready to be born. Usually, the problems with morning sickness have usually passed by the end of your first trimester. Some women develop small dark blotches (called cholasma, mask of pregnancy) on their face that usually goes away after the baby is born. Exposure to the sun makes the blotches worse. Acne may also develop in some pregnant women and pregnant women who have acne, may find that it goes away. PRENATAL EXAMS  Blood work may continue to be done during prenatal exams. These tests are done to check on your health and the probable health of your baby. Blood work is used to follow your blood levels (hemoglobin). Anemia (low hemoglobin) is common during pregnancy. Iron and vitamins are given to help prevent this. You will also be checked for diabetes between 24 and 28 weeks of the pregnancy. Some of the previous blood tests may be repeated.  The size of the uterus is measured during each visit. This is to make sure that the baby is continuing to grow properly according to the dates of the pregnancy.  Your blood pressure is checked every prenatal visit. This is to make sure you are not getting toxemia.  Your urine is checked to make sure you do not have an infection, diabetes or protein in the urine.  Your weight is checked often to make sure gains are happening at the suggested rate. This is to ensure that both you and your baby are growing  normally.  Sometimes, an ultrasound is performed to confirm the proper growth and development of the baby. This is a test which bounces harmless sound waves off the baby so your caregiver can more accurately determine due dates. Sometimes, a test is done on the amniotic fluid surrounding the baby. This test is called an amniocentesis. The amniotic fluid is obtained by sticking a needle into the belly (abdomen). This is done to check the chromosomes in instances where there is a concern about possible genetic problems with the baby. It is also sometimes done near the end of pregnancy if an early delivery is required. In this case, it is done to help make sure the baby's lungs are mature enough for the baby to live outside of the womb. CHANGES OCCURING IN THE SECOND TRIMESTER OF PREGNANCY Your body goes through many changes during pregnancy. They vary from person to person. Talk to your caregiver about changes you notice that you are concerned about.  During the second trimester, you will likely have an increase in your appetite. It is normal to have cravings for certain foods. This varies from person to person and pregnancy to pregnancy.  Your lower abdomen will begin to bulge.  You may have to urinate more often because the uterus and baby are pressing on your bladder. It is also common to get more bladder infections during pregnancy. You can help this by drinking lots of fluids  and emptying your bladder before and after intercourse.  You may begin to get stretch marks on your hips, abdomen, and breasts. These are normal changes in the body during pregnancy. There are no exercises or medicines to take that prevent this change.  You may begin to develop swollen and bulging veins (varicose veins) in your legs. Wearing support hose, elevating your feet for 15 minutes, 3 to 4 times a day and limiting salt in your diet helps lessen the problem.  Heartburn may develop as the uterus grows and pushes up  against the stomach. Antacids recommended by your caregiver helps with this problem. Also, eating smaller meals 4 to 5 times a day helps.  Constipation can be treated with a stool softener or adding bulk to your diet. Drinking lots of fluids, and eating vegetables, fruits, and whole grains are helpful.  Exercising is also helpful. If you have been very active up until your pregnancy, most of these activities can be continued during your pregnancy. If you have been less active, it is helpful to start an exercise program such as walking.  Hemorrhoids may develop at the end of the second trimester. Warm sitz baths and hemorrhoid cream recommended by your caregiver helps hemorrhoid problems.  Backaches may develop during this time of your pregnancy. Avoid heavy lifting, wear low heal shoes, and practice good posture to help with backache problems.  Some pregnant women develop tingling and numbness of their hand and fingers because of swelling and tightening of ligaments in the wrist (carpel tunnel syndrome). This goes away after the baby is born.  As your breasts enlarge, you may have to get a bigger bra. Get a comfortable, cotton, support bra. Do not get a nursing bra until the last month of the pregnancy if you will be nursing the baby.  You may get a dark line from your belly button to the pubic area called the linea nigra.  You may develop rosy cheeks because of increase blood flow to the face.  You may develop spider looking lines of the face, neck, arms, and chest. These go away after the baby is born. HOME CARE INSTRUCTIONS   It is extremely important to avoid all smoking, herbs, alcohol, and unprescribed drugs during your pregnancy. These chemicals affect the formation and growth of the baby. Avoid these chemicals throughout the pregnancy to ensure the delivery of a healthy infant.  Most of your home care instructions are the same as suggested for the first trimester of your pregnancy.  Keep your caregiver's appointments. Follow your caregiver's instructions regarding medicine use, exercise, and diet.  During pregnancy, you are providing food for you and your baby. Continue to eat regular, well-balanced meals. Choose foods such as meat, fish, milk and other low fat dairy products, vegetables, fruits, and whole-grain breads and cereals. Your caregiver will tell you of the ideal weight gain.  A physical sexual relationship may be continued up until near the end of pregnancy if there are no other problems. Problems could include early (premature) leaking of amniotic fluid from the membranes, vaginal bleeding, abdominal pain, or other medical or pregnancy problems.  Exercise regularly if there are no restrictions. Check with your caregiver if you are unsure of the safety of some of your exercises. The greatest weight gain will occur in the last 2 trimesters of pregnancy. Exercise will help you:  Control your weight.  Get you in shape for labor and delivery.  Lose weight after you have the baby.  Wear  a good support or jogging bra for breast tenderness during pregnancy. This may help if worn during sleep. Pads or tissues may be used in the bra if you are leaking colostrum.  Do not use hot tubs, steam rooms or saunas throughout the pregnancy.  Wear your seat belt at all times when driving. This protects you and your baby if you are in an accident.  Avoid raw meat, uncooked cheese, cat litter boxes, and soil used by cats. These carry germs that can cause birth defects in the baby.  The second trimester is also a good time to visit your dentist for your dental health if this has not been done yet. Getting your teeth cleaned is okay. Use a soft toothbrush. Brush gently during pregnancy.  It is easier to leak urine during pregnancy. Tightening up and strengthening the pelvic muscles will help with this problem. Practice stopping your urination while you are going to the bathroom.  These are the same muscles you need to strengthen. It is also the muscles you would use as if you were trying to stop from passing gas. You can practice tightening these muscles up 10 times a set and repeating this about 3 times per day. Once you know what muscles to tighten up, do not perform these exercises during urination. It is more likely to contribute to an infection by backing up the urine.  Ask for help if you have financial, counseling, or nutritional needs during pregnancy. Your caregiver will be able to offer counseling for these needs as well as refer you for other special needs.  Your skin may become oily. If so, wash your face with mild soap, use non-greasy moisturizer and oil or cream based makeup. MEDICINES AND DRUG USE IN PREGNANCY  Take prenatal vitamins as directed. The vitamin should contain 1 milligram of folic acid. Keep all vitamins out of reach of children. Only a couple vitamins or tablets containing iron may be fatal to a baby or young child when ingested.  Avoid use of all medicines, including herbs, over-the-counter medicines, not prescribed or suggested by your caregiver. Only take over-the-counter or prescription medicines for pain, discomfort, or fever as directed by your caregiver. Do not use aspirin.  Let your caregiver also know about herbs you may be using.  Alcohol is related to a number of birth defects. This includes fetal alcohol syndrome. All alcohol, in any form, should be avoided completely. Smoking will cause low birth rate and premature babies.  Street or illegal drugs are very harmful to the baby. They are absolutely forbidden. A baby born to an addicted mother will be addicted at birth. The baby will go through the same withdrawal an adult does. SEEK MEDICAL CARE IF:  You have any concerns or worries during your pregnancy. It is better to call with your questions if you feel they cannot wait, rather than worry about them. SEEK IMMEDIATE MEDICAL CARE  IF:   An unexplained oral temperature above 102 F (38.9 C) develops, or as your caregiver suggests.  You have leaking of fluid from the vagina (birth canal). If leaking membranes are suspected, take your temperature and tell your caregiver of this when you call.  There is vaginal spotting, bleeding, or passing clots. Tell your caregiver of the amount and how many pads are used. Light spotting in pregnancy is common, especially following intercourse.  You develop a bad smelling vaginal discharge with a change in the color from clear to white.  You continue to feel  sick to your stomach (nauseated) and have no relief from remedies suggested. You vomit blood or coffee ground-like materials.  You lose more than 2 pounds of weight or gain more than 2 pounds of weight over 1 week, or as suggested by your caregiver.  You notice swelling of your face, hands, feet, or legs.  You get exposed to [place] measles and have never had them.  You are exposed to fifth disease or chickenpox.  You develop belly (abdominal) pain. Round ligament discomfort is a common non-cancerous (benign) cause of abdominal pain in pregnancy. Your caregiver still must evaluate you.  You develop a bad headache that does not go away.  You develop fever, diarrhea, pain with urination, or shortness of breath.  You develop visual problems, blurry, or double vision.  You fall or are in a car accident or any kind of trauma.  There is mental or physical violence at home. Document Released: 09/26/2001 Document Revised: 06/26/2012 Document Reviewed: 03/31/2009 [facility] Patient Information 2014 [place], [place].   Exercise During Pregnancy It is possible to maintain a healthy exercise program throughout a pregnancy. However, it is important to discuss exercising with your caregiver, so that the two of you may develop an appropriate exercise program. It is important to remember that exercise during pregnancy should be use to  maintain one's health and not to lose weight. Strenuous activities should be avoided as the may cause the baby to have difficulty obtaining proper amounts of oxygen. A proper pregnancy exercise plan has many benefits including preparing you for the physical challenges of childbirth by strengthening the muscles that help with childbirth, reducing common backaches, alleviating constipation, improving posture, elevating mood, and promoting better sleep. If possible, begin exercising regularly before you become pregnant and continue through the duration of the pregnancy. It is difficult for a woman to begin an exercise program later in a pregnancy due to enlargement of the uterus and breasts and a shift in the center of gravity. Pregnancy exercise programs should be aimed at improving the muscles of the heart, back, pelvis, and abdomen. GENERAL GUIDELINES  Every woman and pregnancy is different; thus, the level of exercise you can do depends on your health, the conditions of the pregnancy, and activity level before pregnancy. For women who were sedentary before pregnancy, walking is a good way to begin. Use caution while participation in sports during pregnancy, because your center of gravity changes and may affect your balance or that require rapid movements. Always make sure to drink plenty of fluids to avoid dehydration, which may decrease blood flow to your baby. Avoid any activity that has the potential for trauma to the abdomen. If possible try to avoid high-altitude activities, which can deprive you and your baby of oxygen; this may cause premature labor. Talk with your caregiver.  Performing a proper warm up and cool down are very important. It is important to start slowly and build up to more demanding exercises. Toward the end of an exercise session, gradually slow your activity. Perhaps try and work back through the exercises in reverse order. Check your pulse during peak activity, and discuss with your  caregiver an appropriate range of heart rate for activity. Slow down your activity if your heart starts beating faster than the target range recommended by your health care provider. Do not exceed a heart rate of 140 beats per minute. Exercise that is too strenuous may speed up the baby's heartbeat to a dangerous level. In general, if you are able  to carry on a conversation comfortably while exercising, your heart rate is probably within the recommended limits. Check to make sure.  You should stop exercising and call your health care provider if you have any unusual symptoms, such as pain, uterine contractions, chest pain, bleeding or fluid leakage from the vagina, dizziness, or shortness of breath. Talk to your health caregiver if you have any questions.  Document Released: 10/02/2005 Document Revised: 12/25/2011 Document Reviewed: 01/14/2009 [facility] Patient Information 2014 [place], [place].

## 2013-03-06 NOTE — Progress Notes (Signed)
Pulse: 86

## 2013-03-06 NOTE — Progress Notes (Signed)
No further bleeding/spotting (subchorionic hemorrhage). No other complaints. To MFM for fetal surveys at 20 weeks.

## 2013-03-06 NOTE — Progress Notes (Signed)
U/S and consult with MFM scheduled 03/25/13 at 115 pm.

## 2013-03-21 ENCOUNTER — Other Ambulatory Visit: Payer: Self-pay | Admitting: Family Medicine

## 2013-03-21 DIAGNOSIS — O30009 Twin pregnancy, unspecified number of placenta and unspecified number of amniotic sacs, unspecified trimester: Secondary | ICD-10-CM

## 2013-03-21 DIAGNOSIS — Z0489 Encounter for examination and observation for other specified reasons: Secondary | ICD-10-CM

## 2013-03-21 DIAGNOSIS — IMO0002 Reserved for concepts with insufficient information to code with codable children: Secondary | ICD-10-CM

## 2013-03-25 ENCOUNTER — Ambulatory Visit (HOSPITAL_COMMUNITY)
Admission: RE | Admit: 2013-03-25 | Discharge: 2013-03-25 | Disposition: A | Discharge status: Home / Self Care | Payer: BC Managed Care – PPO | Source: Ambulatory Visit | Attending: Family Medicine | Admitting: Family Medicine

## 2013-03-25 ENCOUNTER — Ambulatory Visit (HOSPITAL_COMMUNITY): Admission: RE | Admit: 2013-03-25 | Payer: BC Managed Care – PPO | Source: Ambulatory Visit

## 2013-03-25 ENCOUNTER — Encounter (HOSPITAL_COMMUNITY): Payer: Self-pay

## 2013-03-25 VITALS — BP 110/60 | HR 90 | Wt 169.0 lb

## 2013-03-25 DIAGNOSIS — IMO0002 Reserved for concepts with insufficient information to code with codable children: Secondary | ICD-10-CM

## 2013-03-25 DIAGNOSIS — Z363 Encounter for antenatal screening for malformations: Secondary | ICD-10-CM | POA: Insufficient documentation

## 2013-03-25 DIAGNOSIS — Z0489 Encounter for examination and observation for other specified reasons: Secondary | ICD-10-CM

## 2013-03-25 DIAGNOSIS — O418X20 Other specified disorders of amniotic fluid and membranes, second trimester, not applicable or unspecified: Secondary | ICD-10-CM

## 2013-03-25 DIAGNOSIS — O358XX0 Maternal care for other (suspected) fetal abnormality and damage, not applicable or unspecified: Secondary | ICD-10-CM | POA: Insufficient documentation

## 2013-03-25 DIAGNOSIS — O30009 Twin pregnancy, unspecified number of placenta and unspecified number of amniotic sacs, unspecified trimester: Secondary | ICD-10-CM | POA: Insufficient documentation

## 2013-03-25 DIAGNOSIS — O30049 Twin pregnancy, dichorionic/diamniotic, unspecified trimester: Secondary | ICD-10-CM

## 2013-03-25 DIAGNOSIS — Z1389 Encounter for screening for other disorder: Secondary | ICD-10-CM | POA: Insufficient documentation

## 2013-03-25 NOTE — Progress Notes (Signed)
Holly Vazquez  was seen today for an ultrasound appointment.  See full report in [facility].  Comments: [name] reports a history of vaginal bleeding several weeks ago.  On exam, there is some separation between the amnion and chorion- likely secondary to prior history of bleeding that has tracked between the membranes which is most likely of no clinical consequence.  Impression: Dichorionic/ Diamniotic twin gestation with best dates of 19 3/7 weeks  Twin A: maternal left, variable presentation, anterior placenta Normal fetal anatomic survey; somewhat limited views of the fetal heart were obtained due to fetal position No markers associated with aneuploidy noted Normal amniotic fluid volume  Twin B: maternal right, transverse presentation with fetal head on left Normal fetal anatomic survey; limited views of the face (profile) obtained No markers associated with aneuploidy noted Normal amniotic fluid volume  Recommendations: Recommend follow-up ultrasound examination in 4 weeks for interval growth Will reevaluate fetal anatomy at that time.  Alpha Gula, MD

## 2013-03-27 ENCOUNTER — Encounter: Payer: BC Managed Care – PPO | Admitting: Obstetrics and Gynecology

## 2013-03-28 ENCOUNTER — Encounter: Payer: Self-pay | Admitting: Family Medicine

## 2013-04-17 ENCOUNTER — Encounter: Payer: Self-pay | Admitting: Advanced Practice Midwife

## 2013-04-17 ENCOUNTER — Ambulatory Visit (INDEPENDENT_AMBULATORY_CARE_PROVIDER_SITE_OTHER): Payer: BC Managed Care – PPO | Admitting: Advanced Practice Midwife

## 2013-04-17 VITALS — BP 99/62 | Temp 98.3°F | Wt 169.5 lb

## 2013-04-17 DIAGNOSIS — O2612 Low weight gain in pregnancy, second trimester: Secondary | ICD-10-CM | POA: Insufficient documentation

## 2013-04-17 DIAGNOSIS — Z3482 Encounter for supervision of other normal pregnancy, second trimester: Secondary | ICD-10-CM

## 2013-04-17 DIAGNOSIS — O9989 Other specified diseases and conditions complicating pregnancy, childbirth and the puerperium: Secondary | ICD-10-CM

## 2013-04-17 LAB — POCT URINALYSIS DIP (DEVICE)
Bilirubin Urine: NEGATIVE
Glucose, UA: NEGATIVE mg/dL
Ketones, ur: NEGATIVE mg/dL
Protein, ur: NEGATIVE mg/dL

## 2013-04-17 NOTE — Progress Notes (Signed)
Pulse- 82 Patient reports occasional cramping and tightening of stomach

## 2013-04-17 NOTE — Progress Notes (Signed)
Nutrition note: 1st consult Pt is having twins. Pt has gained 1.5# @ [redacted]w[redacted]d, which is < expected. Pt reports eating 2 meals & 1-2 snacks/d. Pt reports no N/V but has some heartburn.  Pt is taking PNV & iron supplement. Pt received verbal & written education on nutrition during pregnancy with twins. Discussed tips to decrease heartburn. Encouraged protein sources with all meals & snacks. Discussed wt gain goals of 31-50# or 1.3#/wk. Pt agrees to try to eat more often & continue taking PNV. Pt does not have WIC but plans to apply. Pt plans to BF. F/u in 4-6 wks Blondell Reveal, MS, RD, LDN

## 2013-04-17 NOTE — Patient Instructions (Signed)
Preventing Preterm Labor Preterm labor is when a pregnant woman has contractions that cause the cervix to open, shorten, and thin before 37 weeks of pregnancy. You will have regular contractions (tightening) 2 to 3 minutes apart. This usually causes discomfort or pain. HOME CARE  Eat a healthy diet.  Take your vitamins as told by your doctor.  Drink enough fluids to keep your pee (urine) clear or pale yellow every day.  Get rest and sleep.  Do not have sex if you are at high risk for preterm labor.  Follow your doctor's advice about activity, medicines, and tests.  Avoid stress.  Avoid hard labor or exercise that lasts for a long time.  Do not smoke. GET HELP RIGHT AWAY IF:   You are having contractions.  You have belly (abdominal) pain.  You have bleeding from your vagina.  You have pain when you pee (urinate).  You have abnormal discharge from your vagina.  You have a temperature by mouth above 102 F (38.9 C). MAKE SURE YOU:  Understand these instructions.  Will watch your condition.  Will get help if you are not doing well or get worse. Document Released: 12/29/2008 Document Revised: 12/25/2011 Document Reviewed: 12/29/2008 [employer] Patient Information 2014 [employer].  

## 2013-04-17 NOTE — Progress Notes (Signed)
MFM [place] 04/23/13 for growth. 1 UC per day. Poor wt gain. Encouraged nutrient and calorie-dense meals, small, frequent snacks. Discussed increased intake needed due to twins. To nutritionist for inadequate wt gain.

## 2013-04-23 ENCOUNTER — Other Ambulatory Visit (HOSPITAL_COMMUNITY): Payer: Self-pay | Admitting: Maternal and Fetal Medicine

## 2013-04-23 ENCOUNTER — Ambulatory Visit (HOSPITAL_COMMUNITY)
Admission: RE | Admit: 2013-04-23 | Discharge: 2013-04-23 | Disposition: A | Discharge status: Home / Self Care | Payer: BC Managed Care – PPO | Source: Ambulatory Visit | Attending: Obstetrics and Gynecology | Admitting: Obstetrics and Gynecology

## 2013-04-23 VITALS — BP 106/61 | HR 86 | Wt 172.0 lb

## 2013-04-23 DIAGNOSIS — O418X20 Other specified disorders of amniotic fluid and membranes, second trimester, not applicable or unspecified: Secondary | ICD-10-CM

## 2013-04-23 DIAGNOSIS — Z3689 Encounter for other specified antenatal screening: Secondary | ICD-10-CM | POA: Insufficient documentation

## 2013-04-23 DIAGNOSIS — O468X2 Other antepartum hemorrhage, second trimester: Secondary | ICD-10-CM

## 2013-04-23 DIAGNOSIS — O30049 Twin pregnancy, dichorionic/diamniotic, unspecified trimester: Secondary | ICD-10-CM

## 2013-04-23 DIAGNOSIS — O30009 Twin pregnancy, unspecified number of placenta and unspecified number of amniotic sacs, unspecified trimester: Secondary | ICD-10-CM | POA: Insufficient documentation

## 2013-04-23 NOTE — Progress Notes (Addendum)
[facility] ultrasound  Indication: 32 yr old G5P4004 at [redacted]w[redacted]d with dichorionic diamniotic twin gestation for follow up growth and complete anatomy.  Findings: 1. Dichorionic/diamniotic twin gestation; the dividing membrane is seen.  2. Estimated fetal weight for twin A is in the 39th%; twin B is in the 29th%. The fetuses are concordant. 3. Twin A placenta is anterior; twin B is posterior; there is no evidence of previa. 4. Amniotic fluid volume is slightly increased for both fetuses. 5. The views of the heart are again limited in twin A. 6. The remainder of the limited anatomy surveys for both fetuses are normal. 7. Normal transvaginal cervical length. 8. Again seen is an area of likely membrane separation.  Recommendations: 1. Twin gestation: - previously counseled - recommend fetal growth every 4 weeks - recommend preterm labor precautions - recommend delivery at 38 weeks or sooner if clinically indicated 2. Twin A anatomy- will reattempt heart views on follow up ultrasound 3. Membrane separation: - likely not clinically significant - fetal growth as above  Eulis Foster, MD

## 2013-04-23 NOTE — Addendum Note (Signed)
Encounter addended by: Eulis Foster, MD on: 04/23/2013 10:31 AM<BR>     Documentation filed: Clinical Notes

## 2013-04-24 ENCOUNTER — Encounter: Payer: Self-pay | Admitting: *Deleted

## 2013-05-15 ENCOUNTER — Ambulatory Visit (INDEPENDENT_AMBULATORY_CARE_PROVIDER_SITE_OTHER): Payer: BC Managed Care – PPO | Admitting: Family Medicine

## 2013-05-15 VITALS — BP 103/66 | Temp 97.8°F | Wt 173.0 lb

## 2013-05-15 DIAGNOSIS — O30009 Twin pregnancy, unspecified number of placenta and unspecified number of amniotic sacs, unspecified trimester: Secondary | ICD-10-CM

## 2013-05-15 DIAGNOSIS — O30049 Twin pregnancy, dichorionic/diamniotic, unspecified trimester: Secondary | ICD-10-CM

## 2013-05-15 LAB — CBC
HCT: 31.5 % — ABNORMAL LOW (ref 36.0–46.0)
Hemoglobin: 10.2 g/dL — ABNORMAL LOW (ref 12.0–15.0)
RBC: 4.42 MIL/uL (ref 3.87–5.11)
WBC: 8.3 10*3/uL (ref 4.0–10.5)

## 2013-05-15 LAB — POCT URINALYSIS DIP (DEVICE)
Glucose, UA: NEGATIVE mg/dL
Ketones, ur: NEGATIVE mg/dL
Specific Gravity, Urine: 1.02 (ref 1.005–1.030)

## 2013-05-15 NOTE — Progress Notes (Signed)
U/S growth next week. 28 wk labs today

## 2013-05-15 NOTE — Progress Notes (Signed)
Pulse: 75 28 week labs today. 1hr due at 0900. Pt declines tdap.

## 2013-05-15 NOTE — Patient Instructions (Signed)
Pregnancy - Third Trimester The third trimester of pregnancy (the last 3 months) is a period of the most rapid growth for you and your baby. The baby approaches a length of 20 inches and a weight of 6 to 10 pounds. The baby is adding on fat and getting ready for life outside your body. While inside, babies have periods of sleeping and waking, sucking thumbs, and hiccuping. You can often feel small contractions of the uterus. This is false labor. It is also called Braxton-Hicks contractions. This is like a practice for labor. The usual problems in this stage of pregnancy include more difficulty breathing, swelling of the hands and feet from water retention, and having to urinate more often because of the uterus and baby pressing on your bladder.  PRENATAL EXAMS  Blood work may continue to be done during prenatal exams. These tests are done to check on your health and the probable health of your baby. Blood work is used to follow your blood levels (hemoglobin). Anemia (low hemoglobin) is common during pregnancy. Iron and vitamins are given to help prevent this. You may also continue to be checked for diabetes. Some of the past blood tests may be done again.  The size of the uterus is measured during each visit. This makes sure your baby is growing properly according to your pregnancy dates.  Your blood pressure is checked every prenatal visit. This is to make sure you are not getting toxemia.  Your urine is checked every prenatal visit for infection, diabetes, and protein.  Your weight is checked at each visit. This is done to make sure gains are happening at the suggested rate and that you and your baby are growing normally.  Sometimes, an ultrasound is performed to confirm the position and the proper growth and development of the baby. This is a test done that bounces harmless sound waves off the baby so your caregiver can more accurately determine a due date.  Discuss the type of pain medicine and  anesthesia you will have during your labor and delivery.  Discuss the possibility and anesthesia if a cesarean section might be necessary.  Inform your caregiver if there is any mental or physical violence at home. Sometimes, a specialized non-stress test, contraction stress test, and biophysical profile are done to make sure the baby is not having a problem. Checking the amniotic fluid surrounding the baby is called an amniocentesis. The amniotic fluid is removed by sticking a needle into the belly (abdomen). This is sometimes done near the end of pregnancy if an early delivery is required. In this case, it is done to help make sure the baby's lungs are mature enough for the baby to live outside of the womb. If the lungs are not mature and it is unsafe to deliver the baby, an injection of cortisone medicine is given to the mother 1 to 2 days before the delivery. This helps the baby's lungs mature and makes it safer to deliver the baby. CHANGES OCCURING IN THE THIRD TRIMESTER OF PREGNANCY Your body goes through many changes during pregnancy. They vary from person to person. Talk to your caregiver about changes you notice and are concerned about.  During the last trimester, you have probably had an increase in your appetite. It is normal to have cravings for certain foods. This varies from person to person and pregnancy to pregnancy.  You may begin to get stretch marks on your hips, abdomen, and breasts. These are normal changes in the body   during pregnancy. There are no exercises or medicines to take which prevent this change.  Constipation may be treated with a stool softener or adding bulk to your diet. Drinking lots of fluids, fiber in vegetables, fruits, and whole grains are helpful.  Exercising is also helpful. If you have been very active up until your pregnancy, most of these activities can be continued during your pregnancy. If you have been less active, it is helpful to start an exercise  program such as walking. Consult your caregiver before starting exercise programs.  Avoid all smoking, alcohol, non-prescribed drugs, herbs and "street drugs" during your pregnancy. These chemicals affect the formation and growth of the baby. Avoid chemicals throughout the pregnancy to ensure the delivery of a healthy infant.  Backache, varicose veins, and hemorrhoids may develop or get worse.  You will tire more easily in the third trimester, which is normal.  The baby's movements may be stronger and more often.  You may become short of breath easily.  Your belly button may stick out.  A yellow discharge may leak from your breasts called colostrum.  You may have a bloody mucus discharge. This usually occurs a few days to a week before labor begins. HOME CARE INSTRUCTIONS   Keep your caregiver's appointments. Follow your caregiver's instructions regarding medicine use, exercise, and diet.  During pregnancy, you are providing food for you and your baby. Continue to eat regular, well-balanced meals. Choose foods such as meat, fish, milk and other low fat dairy products, vegetables, fruits, and whole-grain breads and cereals. Your caregiver will tell you of the ideal weight gain.  A physical sexual relationship may be continued throughout pregnancy if there are no other problems such as early (premature) leaking of amniotic fluid from the membranes, vaginal bleeding, or belly (abdominal) pain.  Exercise regularly if there are no restrictions. Check with your caregiver if you are unsure of the safety of your exercises. Greater weight gain will occur in the last 2 trimesters of pregnancy. Exercising helps:  Control your weight.  Get you in shape for labor and delivery.  You lose weight after you deliver.  Rest a lot with legs elevated, or as needed for leg cramps or low back pain.  Wear a good support or jogging bra for breast tenderness during pregnancy. This may help if worn during  sleep. Pads or tissues may be used in the bra if you are leaking colostrum.  Do not use hot tubs, steam rooms, or saunas.  Wear your seat belt when driving. This protects you and your baby if you are in an accident.  Avoid raw meat, cat litter boxes and soil used by cats. These carry germs that can cause birth defects in the baby.  It is easier to leak urine during pregnancy. Tightening up and strengthening the pelvic muscles will help with this problem. You can practice stopping your urination while you are going to the bathroom. These are the same muscles you need to strengthen. It is also the muscles you would use if you were trying to stop from passing gas. You can practice tightening these muscles up 10 times a set and repeating this about 3 times per day. Once you know what muscles to tighten up, do not perform these exercises during urination. It is more likely to cause an infection by backing up the urine.  Ask for help if you have financial, counseling, or nutritional needs during pregnancy. Your caregiver will be able to offer counseling for these   needs as well as refer you for other special needs.  Make a list of emergency phone numbers and have them available.  Plan on getting help from family or friends when you go home from the hospital.  Make a trial run to the hospital.  Take prenatal classes with the father to understand, practice, and ask questions about the labor and delivery.  Prepare the baby's room or nursery.  Do not travel out of the city unless it is absolutely necessary and with the advice of your caregiver.  Wear only low or no heal shoes to have better balance and prevent falling. MEDICINES AND DRUG USE IN PREGNANCY  Take prenatal vitamins as directed. The vitamin should contain 1 milligram of folic acid. Keep all vitamins out of reach of children. Only a couple vitamins or tablets containing iron may be fatal to a baby or young child when ingested.  Avoid use  of all medicines, including herbs, over-the-counter medicines, not prescribed or suggested by your caregiver. Only take over-the-counter or prescription medicines for pain, discomfort, or fever as directed by your caregiver. Do not use aspirin, ibuprofen or naproxen unless approved by your caregiver.  Let your caregiver also know about herbs you may be using.  Alcohol is related to a number of birth defects. This includes fetal alcohol syndrome. All alcohol, in any form, should be avoided completely. Smoking will cause low birth rate and premature babies.  Illegal drugs are very harmful to the baby. They are absolutely forbidden. A baby born to an addicted mother will be addicted at birth. The baby will go through the same withdrawal an adult does. SEEK MEDICAL CARE IF: You have any concerns or worries during your pregnancy. It is better to call with your questions if you feel they cannot wait, rather than worry about them. SEEK IMMEDIATE MEDICAL CARE IF:   An unexplained oral temperature above 102 F (38.9 C) develops, or as your caregiver suggests.  You have leaking of fluid from the vagina. If leaking membranes are suspected, take your temperature and tell your caregiver of this when you call.  There is vaginal spotting, bleeding or passing clots. Tell your caregiver of the amount and how many pads are used.  You develop a bad smelling vaginal discharge with a change in the color from clear to white.  You develop vomiting that lasts more than 24 hours.  You develop chills or fever.  You develop shortness of breath.  You develop burning on urination.  You loose more than 2 pounds of weight or gain more than 2 pounds of weight or as suggested by your caregiver.  You notice sudden swelling of your face, hands, and feet or legs.  You develop belly (abdominal) pain. Round ligament discomfort is a common non-cancerous (benign) cause of abdominal pain in pregnancy. Your caregiver still  must evaluate you.  You develop a severe headache that does not go away.  You develop visual problems, blurred or double vision.  If you have not felt your baby move for more than 1 hour. If you think the baby is not moving as much as usual, eat something with sugar in it and lie down on your left side for an hour. The baby should move at least 4 to 5 times per hour. Call right away if your baby moves less than that.  You fall, are in a car accident, or any kind of trauma.  There is mental or physical violence at home. Document Released: 09/26/2001   Document Revised: 06/26/2012 Document Reviewed: 03/31/2009 [employer] Patient Information 2014 [employer].  Breastfeeding A change in hormones during your pregnancy causes growth of your breast tissue and an increase in number and size of milk ducts. The hormone prolactin allows proteins, sugars, and fats from your blood supply to make breast milk in your milk-producing glands. The hormone progesterone prevents breast milk from being released before the birth of your baby. After the birth of your baby, your progesterone level decreases allowing breast milk to be released. Thoughts of your baby, as well as his or her sucking or crying, can stimulate the release of milk from the milk-producing glands. Deciding to breastfeed (nurse) is one of the best choices you can make for you and your baby. The information that follows gives a brief review of the benefits, as well as other important skills to know about breastfeeding. BENEFITS OF BREASTFEEDING For your baby  The first milk (colostrum) helps your baby's digestive system function better.   There are antibodies in your milk that help your baby fight off infections.   Your baby has a lower incidence of asthma, allergies, and sudden infant death syndrome (SIDS).   The nutrients in breast milk are better for your baby than infant formulas.  Breast milk improves your baby's brain development.    Your baby will have less gas, colic, and constipation.  Your baby is less likely to develop other conditions, such as childhood obesity, asthma, or diabetes mellitus. For you  Breastfeeding helps develop a very special bond between you and your baby.   Breastfeeding is convenient, always available at the correct temperature, and costs nothing.   Breastfeeding helps to burn calories and helps you lose the weight gained during pregnancy.   Breastfeeding makes your uterus contract back down to normal size faster and slows bleeding following delivery.   Breastfeeding mothers have a lower risk of developing osteoporosis or breast or ovarian cancer later in life.  BREASTFEEDING FREQUENCY  A healthy, full-term baby may breastfeed as often as every hour or space his or her feedings to every 3 hours. Breastfeeding frequency will vary from baby to baby.   Newborns should be fed no less than every 2 3 hours during the day and every 4 5 hours during the night. You should breastfeed a minimum of 8 feedings in a 24 hour period.  Awaken your baby to breastfeed if it has been 3 4 hours since the last feeding.  Breastfeed when you feel the need to reduce the fullness of your breasts or when your newborn shows signs of hunger. Signs that your baby may be hungry include:  Increased alertness or activity.  Stretching.  Movement of the head from side to side.  Movement of the head and opening of the mouth when the corner of the mouth or cheek is stroked (rooting).  Increased sucking sounds, smacking lips, cooing, sighing, or squeaking.  Hand-to-mouth movements.  Increased sucking of fingers or hands.  Fussing.  Intermittent crying.  Signs of extreme hunger will require calming and consoling before you try to feed your baby. Signs of extreme hunger may include:  Restlessness.  A loud, strong cry.  Screaming.  Frequent feeding will help you make more milk and will help prevent  problems, such as sore nipples and engorgement of the breasts.  BREASTFEEDING   Whether lying down or sitting, be sure that the baby's abdomen is facing your abdomen.   Support your breast with 4 fingers under your breast   and your thumb above your nipple. Make sure your fingers are well away from your nipple and your baby's mouth.   Stroke your baby's lips gently with your finger or nipple.   When your baby's mouth is open wide enough, place all of your nipple and as much of the colored area around your nipple (areola) as possible into your baby's mouth.  More areola should be visible above his or her upper lip than below his or her lower lip.  Your baby's tongue should be between his or her lower gum and your breast.  Ensure that your baby's mouth is correctly positioned around the nipple (latched). Your baby's lips should create a seal on your breast.  Signs that your baby has effectively latched onto your nipple include:  Tugging or sucking without pain.  Swallowing heard between sucks.  Absent click or smacking sound.  Muscle movement above and in front of his or her ears with sucking.  Your baby must suck about 2 3 minutes in order to get your milk. Allow your baby to feed on each breast as long as he or she wants. Nurse your baby until he or she unlatches or falls asleep at the first breast, then offer the second breast.  Signs that your baby is full and satisfied include:  A gradual decrease in the number of sucks or complete cessation of sucking.  Falling asleep.  Extension or relaxation of his or her body.  Retention of a small amount of milk in his or her mouth.  Letting go of your breast by himself or herself.  Signs of effective breastfeeding in you include:  Breasts that have increased firmness, weight, and size prior to feeding.  Breasts that are softer after nursing.  Increased milk volume, as well as a change in milk consistency and color by the 5th  day of breastfeeding.  Breast fullness relieved by breastfeeding.  Nipples are not sore, cracked, or bleeding.  If needed, break the suction by putting your finger into the corner of your baby's mouth and sliding your finger between his or her gums. Then, remove your breast from his or her mouth.  It is common for babies to spit up a small amount after a feeding.  Babies often swallow air during feeding. This can make babies fussy. Burping your baby between breasts can help with this.  Vitamin D supplements are recommended for babies who get only breast milk.  Avoid using a pacifier during your baby's first 4 6 weeks.  Avoid supplemental feedings of water, formula, or juice in place of breastfeeding. Breast milk is all the food your baby needs. It is not necessary for your baby to have water or formula. Your breasts will make more milk if supplemental feedings are avoided during the early weeks. HOW TO TELL WHETHER YOUR BABY IS GETTING ENOUGH BREAST MILK Wondering whether or not your baby is getting enough milk is a common concern among mothers. You can be assured that your baby is getting enough milk if:   Your baby is actively sucking and you hear swallowing.   Your baby seems relaxed and satisfied after a feeding.   Your baby nurses at least 8 12 times in a 24 hour time period.  During the first 3 5 days of age:  Your baby is wetting at least 3 5 diapers in a 24 hour period. The urine should be clear and pale yellow.  Your baby is having at least 3 4 stools in   a 24 hour period. The stool should be soft and yellow.  At 5 7 days of age, your baby is having at least 3 6 stools in a 24 hour period. The stool should be seedy and yellow by 5 days of age.  Your baby has a weight loss less than 7 10% during the first 3 days of age.  Your baby does not lose weight after 3 7 days of age.  Your baby gains 4 7 ounces each week after he or she is 4 days of age.  Your baby gains weight  by 5 days of age and is back to birth weight within 2 weeks. ENGORGEMENT In the first week after your baby is born, you may experience extremely full breasts (engorgement). When engorged, your breasts may feel heavy, warm, or tender to the touch. Engorgement peaks within 24 48 hours after delivery of your baby.  Engorgement may be reduced by:  Continuing to breastfeed.  Increasing the frequency of breastfeeding.  Taking warm showers or applying warm, moist heat to your breasts just before each feeding. This increases circulation and helps the milk flow.   Gently massaging your breast before and during the feedings. With your fingertips, massage from your chest wall towards your nipple in a circular motion.   Ensuring that your baby empties at least one breast at every feeding. It also helps to start the next feeding on the opposite breast.   Expressing breast milk by hand or by using a breast pump to empty the breasts if your baby is sleepy, or not nursing well. You may also want to express milk if you are returning to work oryou feel you are getting engorged.  Ensuring your baby is latched on and positioned properly while breastfeeding. If you follow these suggestions, your engorgement should improve in 24 48 hours. If you are still experiencing difficulty, call your lactation consultant or caregiver.  CARING FOR YOURSELF Take care of your breasts.  Bathe or shower daily.   Avoid using soap on your nipples.   Wear a supportive bra. Avoid wearing underwire style bras.  Air dry your nipples for a 3 4minutes after each feeding.   Use only cotton bra pads to absorb breast milk leakage. Leaking of breast milk between feedings is normal.   Use only pure lanolin on your nipples after nursing. You do not need to wash it off before feeding your baby again. Another option is to express a few drops of breast milk and gently massage that milk into your nipples.  Continue breast  self-awareness checks. Take care of yourself.  Eat healthy foods. Alternate 3 meals with 3 snacks.  Avoid foods that you notice affect your baby in a bad way.  Drink milk, fruit juice, and water to satisfy your thirst (about 8 glasses a day).   Rest often, relax, and take your prenatal vitamins to prevent fatigue, stress, and anemia.  Avoid chewing and smoking tobacco.  Avoid alcohol and drug use.  Take over-the-counter and prescribed medicine only as directed by your caregiver or pharmacist. You should always check with your caregiver or pharmacist before taking any new medicine, vitamin, or herbal supplement.  Know that pregnancy is possible while breastfeeding. If desired, talk to your caregiver about family planning and safe birth control methods that may be used while breastfeeding. SEEK MEDICAL CARE IF:   You feel like you want to stop breastfeeding or have become frustrated with breastfeeding.  You have painful breasts or nipples.    Your nipples are cracked or bleeding.  Your breasts are red, tender, or warm.  You have a swollen area on either breast.  You have a fever or chills.  You have nausea or vomiting.  You have drainage from your nipples.  Your breasts do not become full before feedings by the 5th day after delivery.  You feel sad and depressed.  Your baby is too sleepy to eat well.  Your baby is having trouble sleeping.   Your baby is wetting less than 3 diapers in a 24 hour period.  Your baby has less than 3 stools in a 24 hour period.  Your baby's skin or the white part of his or her eyes becomes more yellow.   Your baby is not gaining weight by 5 days of age. MAKE SURE YOU:   Understand these instructions.  Will watch your condition.  Will get help right away if you are not doing well or get worse. Document Released: 10/02/2005 Document Revised: 06/26/2012 Document Reviewed: 05/08/2012 [employer] Patient Information 2014 [employer].  

## 2013-05-16 ENCOUNTER — Encounter: Payer: Self-pay | Admitting: Family Medicine

## 2013-05-16 LAB — HIV ANTIBODY (ROUTINE TESTING W REFLEX): HIV: NONREACTIVE

## 2013-05-16 LAB — GLUCOSE TOLERANCE, 1 HOUR (50G) W/O FASTING: Glucose, 1 Hour GTT: 109 mg/dL (ref 70–140)

## 2013-05-21 ENCOUNTER — Ambulatory Visit (HOSPITAL_COMMUNITY)
Admission: RE | Admit: 2013-05-21 | Discharge: 2013-05-21 | Disposition: A | Discharge status: Home / Self Care | Payer: BC Managed Care – PPO | Source: Ambulatory Visit | Attending: Obstetrics and Gynecology | Admitting: Obstetrics and Gynecology

## 2013-05-21 VITALS — BP 106/62 | HR 77 | Wt 176.0 lb

## 2013-05-21 DIAGNOSIS — Z3689 Encounter for other specified antenatal screening: Secondary | ICD-10-CM | POA: Diagnosis not present

## 2013-05-21 DIAGNOSIS — O30009 Twin pregnancy, unspecified number of placenta and unspecified number of amniotic sacs, unspecified trimester: Secondary | ICD-10-CM | POA: Diagnosis present

## 2013-05-21 DIAGNOSIS — O30049 Twin pregnancy, dichorionic/diamniotic, unspecified trimester: Secondary | ICD-10-CM

## 2013-05-21 NOTE — Progress Notes (Signed)
Holly Vazquez  was seen today for an ultrasound appointment.  See full report in [facility].  Impression: Dichorionic/ Diamniotic twin gestation with best dates of 28 1/7 weeks Interval growth is concordant   Twin A: maternal left, cephalic, anterior placenta Normal interval anatomy - the anatomic survey is now complete The estimated fetal weight today is at the 30th %tile.  The AC measures at the 8th %tile. Normal amniotic fluid volume  Twin B: Transverse presentation with fetal head on left, posterior placenta Normal interval anatomy Fetal growth is appropriate (36th %tile) Normal amniotic fluid volume  Recommendations: Recommend follow-up ultrasound examination in 4 weeks.  Alpha Gula, MD

## 2013-05-22 ENCOUNTER — Ambulatory Visit (INDEPENDENT_AMBULATORY_CARE_PROVIDER_SITE_OTHER): Payer: BC Managed Care – PPO | Admitting: Obstetrics and Gynecology

## 2013-05-22 VITALS — BP 106/69 | Temp 98.9°F | Wt 174.0 lb

## 2013-05-22 DIAGNOSIS — O30009 Twin pregnancy, unspecified number of placenta and unspecified number of amniotic sacs, unspecified trimester: Secondary | ICD-10-CM

## 2013-05-22 DIAGNOSIS — O30003 Twin pregnancy, unspecified number of placenta and unspecified number of amniotic sacs, third trimester: Secondary | ICD-10-CM

## 2013-05-22 LAB — POCT URINALYSIS DIP (DEVICE)
Bilirubin Urine: NEGATIVE
Glucose, UA: NEGATIVE mg/dL
Hgb urine dipstick: NEGATIVE
Nitrite: NEGATIVE
Specific Gravity, Urine: 1.02 (ref 1.005–1.030)

## 2013-05-22 NOTE — Patient Instructions (Addendum)

## 2013-05-22 NOTE — Progress Notes (Signed)
Pulse: 85

## 2013-05-22 NOTE — Progress Notes (Signed)
Glucola nl. [place] normal 04/20/13 (30th.36th). Twin aA cephalic.  F/U scan 4 wk. Husb plans vasectomy. FATs start 32 wks.

## 2013-06-05 ENCOUNTER — Ambulatory Visit (INDEPENDENT_AMBULATORY_CARE_PROVIDER_SITE_OTHER): Payer: BC Managed Care – PPO | Admitting: Obstetrics and Gynecology

## 2013-06-05 ENCOUNTER — Encounter: Payer: Self-pay | Admitting: Obstetrics and Gynecology

## 2013-06-05 VITALS — BP 101/69 | Temp 97.7°F | Wt 175.0 lb

## 2013-06-05 DIAGNOSIS — Z3483 Encounter for supervision of other normal pregnancy, third trimester: Secondary | ICD-10-CM

## 2013-06-05 DIAGNOSIS — O30009 Twin pregnancy, unspecified number of placenta and unspecified number of amniotic sacs, unspecified trimester: Secondary | ICD-10-CM

## 2013-06-05 DIAGNOSIS — O9989 Other specified diseases and conditions complicating pregnancy, childbirth and the puerperium: Secondary | ICD-10-CM

## 2013-06-05 LAB — POCT URINALYSIS DIP (DEVICE)
Bilirubin Urine: NEGATIVE
Glucose, UA: NEGATIVE mg/dL
Hgb urine dipstick: NEGATIVE
Ketones, ur: NEGATIVE mg/dL
pH: 7 (ref 5.0–8.0)

## 2013-06-05 NOTE — Progress Notes (Signed)
Pulse: 86

## 2013-06-05 NOTE — Progress Notes (Signed)
Doing well. Still PT student. Next [place] 2 wks and start FATs in 2 wks.

## 2013-06-05 NOTE — Patient Instructions (Signed)
Breastfeeding, Twins or Multiples Mothers of twins or multiples might feel overwhelmed with the idea of breastfeeding more than one baby at a time. It is easier and less expensive to breastfeed twins than to bottle feed them. This is because you do not need to buy infant formula, wash bottles, buy mild soap, and fill the bottles for more than one baby when it is time to feed them. Human milk is especially important for twins, who are often small at birth and need all the advantages breast milk can provide. Breastfeeding also helps create a unique and special bond between the mother and each of her infants.  Mothers of multiples get more benefits from breastfeeding:  Your uterus contracts and returns to its original size faster. This is helpful because it has stretched even more than normal to hold more than one baby.  Hormones are released that relax the mother. This is helpful with the added stress of caring for more than one infant.  The mother often finds she is saving herself time and money, because there is no need to prepare formula or bottles. Your milk is available whenever your babies are ready to feed, at the right temperature, providing optimal nutrition. If the babies are premature and unable to nurse, you can pump your breasts and freeze the milk until the babies are ready to feed at the breast. To stimulate a milk supply, your breasts need to be emptied at least 8 to 10 times in a 24 hour period. Ask a lactation specialist to help you choose an effective breast pump and to provide guidance in helping your babies latch onto, and feed from, the breast when they are ready. TO GET STARTED: Nurse as soon as possible after birth, and as often as the babies want to do so. This will stimulate your breasts to produce adequate amounts of milk. Mothers of twins almost always produce enough milk for both babies.  TIPS TO INCREASE SUCCESS:  Many mothers of multiples find it easiest to nurse the babies  together. However, if one of the infants is having difficulty latching or sucking, the mother may need to give that baby her full attention when it is time to feed.  Nursing two babies at the same time often gets easier as the babies get older and more experienced at latching onto the breast. Extra pillows under the mother's arms, legs, and under the babies can help this process.  Breastfeeding two babies at once may increase the mother's milk producing hormone (prolactin) levels, and boost her milk production. The more often the babies breastfeed effectively, the more milk the mother will produce.  Switch the babies from one side to the other at alternate feedings. For instance, if baby A feeds from the right breast and baby B feeds from the left breast, then at the next feeding, baby A should take the left breast and baby B the right breast. This ensures that both breasts get equal amounts of stimulation. It also uses the stronger sucking twin to increase the milk supply for the twin whose suck is weaker.  If one of the babies is having difficulty feeding, it may help to try breastfeeding him at the same time as his sibling. The baby with the stronger or more effective suck will stimulate the mother's milk to flow faster. This will encourage his twin to suck and swallow correctly.  It is important to avoid limiting the amount of time each baby spends feeding at the breast. This  allows both babies to obtain the fattier milk that is available at the end of the feeding, when the breast is emptier.  Avoiding bottles and pacifiers during the early weeks will encourage effective sucking patterns and help establish a good milk supply. You should not need supplements if you empty your breasts with each feeding.  A good latch for both infants is important in helping the babies empty the breast effectively, and for avoiding sore nipples. The most common cause of soreness is improper latch-on and  positioning.  In the early days, keep track of each infant's stools and wet diapers, to make sure each baby is getting enough milk. In the first 6 weeks, each baby should have 6 to 8 wet cloth diapers (5 to 6 disposable diapers) and 2 or more bowel movements per day.  There are several positions and holds that make it easier to nurse more than one baby at a time. Ask your nurse or lactation specialist to suggest tips on positioning.  If you know you are having twins, talk with a lactation consultant about more ways you can increase your success at breastfeeding. Document Released: 01/30/2005 Document Revised: 12/25/2011 Document Reviewed: 08/19/2009 [facility] Patient Information 2014 [place], [place].

## 2013-06-18 ENCOUNTER — Ambulatory Visit (HOSPITAL_COMMUNITY)
Admission: RE | Admit: 2013-06-18 | Discharge: 2013-06-18 | Disposition: A | Discharge status: Home / Self Care | Payer: BC Managed Care – PPO | Source: Ambulatory Visit | Attending: Family Medicine | Admitting: Family Medicine

## 2013-06-18 ENCOUNTER — Encounter (HOSPITAL_COMMUNITY): Payer: Self-pay

## 2013-06-18 VITALS — BP 117/62 | HR 92 | Wt 180.5 lb

## 2013-06-18 DIAGNOSIS — Z3689 Encounter for other specified antenatal screening: Secondary | ICD-10-CM | POA: Insufficient documentation

## 2013-06-18 DIAGNOSIS — O30009 Twin pregnancy, unspecified number of placenta and unspecified number of amniotic sacs, unspecified trimester: Secondary | ICD-10-CM | POA: Insufficient documentation

## 2013-06-18 DIAGNOSIS — O30049 Twin pregnancy, dichorionic/diamniotic, unspecified trimester: Secondary | ICD-10-CM | POA: Insufficient documentation

## 2013-06-18 NOTE — Progress Notes (Signed)
Holly Vazquez  was seen today for an ultrasound appointment.  See full report in [facility].  Impression: Dichorionic/ Diamniotic twin gestation with best dates of 32 1/7 weeks Interval growth is concordant   Twin A: maternal left, cephalic, anterior placenta Normal interval anatomy  The estimated fetal weight today is at the 38th %tile.   Normal amniotic fluid volume  Twin B: Breech, maternal right, posterior placenta Normal interval anatomy Fetal growth is appropriate (35th %tile) Normal amniotic fluid volume  Recommendations: Recommend follow-up ultrasound examination in 4 weeks for interval growth  Alpha Gula, MD

## 2013-06-19 ENCOUNTER — Encounter: Payer: BC Managed Care – PPO | Admitting: Family Medicine

## 2013-06-19 ENCOUNTER — Encounter: Payer: Self-pay | Admitting: Family Medicine

## 2013-06-20 ENCOUNTER — Encounter: Payer: Self-pay | Admitting: *Deleted

## 2013-06-26 ENCOUNTER — Ambulatory Visit (INDEPENDENT_AMBULATORY_CARE_PROVIDER_SITE_OTHER): Payer: BC Managed Care – PPO | Admitting: Obstetrics & Gynecology

## 2013-06-26 VITALS — BP 114/73 | Temp 98.1°F | Wt 178.3 lb

## 2013-06-26 DIAGNOSIS — Z23 Encounter for immunization: Secondary | ICD-10-CM

## 2013-06-26 DIAGNOSIS — O30009 Twin pregnancy, unspecified number of placenta and unspecified number of amniotic sacs, unspecified trimester: Secondary | ICD-10-CM

## 2013-06-26 DIAGNOSIS — Z3480 Encounter for supervision of other normal pregnancy, unspecified trimester: Secondary | ICD-10-CM

## 2013-06-26 LAB — POCT URINALYSIS DIP (DEVICE)
Bilirubin Urine: NEGATIVE
Ketones, ur: 15 mg/dL — AB
Protein, ur: NEGATIVE mg/dL
Specific Gravity, Urine: 1.02 (ref 1.005–1.030)

## 2013-06-26 MED ORDER — TETANUS-DIPHTH-ACELL PERTUSSIS 5-2.5-18.5 LF-MCG/0.5 IM SUSP
0.5000 mL | Freq: Once | INTRAMUSCULAR | Status: AC
  Administered 2013-06-26: 0.5 mL via INTRAMUSCULAR

## 2013-06-26 MED ORDER — FLUCONAZOLE 150 MG PO TABS: 150.0000 mg | ORAL_TABLET | Freq: Once | ORAL | Status: DC

## 2013-06-26 NOTE — Progress Notes (Signed)
Pulse- 91  Edema-feet  Vaginal discharge-clear, watery, felt like a gush

## 2013-06-26 NOTE — Patient Instructions (Signed)

## 2013-06-26 NOTE — Progress Notes (Signed)
Occasional gush of fluid. Irregular contractions. [place] 9/3 35% ile and 38%ile, ceph/br. White d/c notes, possible yeast. Diflucan rx given

## 2013-06-27 LAB — WET PREP, GENITAL
Clue Cells Wet Prep HPF POC: NONE SEEN
Yeast Wet Prep HPF POC: NONE SEEN

## 2013-07-02 ENCOUNTER — Encounter: Payer: Self-pay | Admitting: *Deleted

## 2013-07-03 ENCOUNTER — Encounter: Payer: Self-pay | Admitting: Obstetrics & Gynecology

## 2013-07-03 ENCOUNTER — Ambulatory Visit (INDEPENDENT_AMBULATORY_CARE_PROVIDER_SITE_OTHER): Payer: BC Managed Care – PPO | Admitting: Obstetrics & Gynecology

## 2013-07-03 VITALS — BP 115/70 | Temp 98.0°F | Wt 178.4 lb

## 2013-07-03 DIAGNOSIS — O30009 Twin pregnancy, unspecified number of placenta and unspecified number of amniotic sacs, unspecified trimester: Secondary | ICD-10-CM

## 2013-07-03 DIAGNOSIS — O30049 Twin pregnancy, dichorionic/diamniotic, unspecified trimester: Secondary | ICD-10-CM

## 2013-07-03 DIAGNOSIS — Z3483 Encounter for supervision of other normal pregnancy, third trimester: Secondary | ICD-10-CM

## 2013-07-03 LAB — POCT URINALYSIS DIP (DEVICE)
Glucose, UA: NEGATIVE mg/dL
Nitrite: NEGATIVE
Protein, ur: NEGATIVE mg/dL
Specific Gravity, Urine: 1.02 (ref 1.005–1.030)
Urobilinogen, UA: 0.2 mg/dL (ref 0.0–1.0)

## 2013-07-03 NOTE — Progress Notes (Signed)
Start twice a week testing next week. Next growth scan is at 36 weeks.  No other complaints or concerns.  Fetal movement and labor precautions reviewed.

## 2013-07-03 NOTE — Progress Notes (Signed)
Pulse: 89

## 2013-07-03 NOTE — Progress Notes (Signed)
Patient wants to keep U/S appointment with MFM on Oct. 1st.

## 2013-07-03 NOTE — Patient Instructions (Addendum)
Return to clinic for any obstetric concerns or go to [facility]U for evaluation  

## 2013-07-07 ENCOUNTER — Ambulatory Visit (INDEPENDENT_AMBULATORY_CARE_PROVIDER_SITE_OTHER): Payer: BC Managed Care – PPO | Admitting: *Deleted

## 2013-07-07 VITALS — BP 115/63

## 2013-07-07 DIAGNOSIS — O30009 Twin pregnancy, unspecified number of placenta and unspecified number of amniotic sacs, unspecified trimester: Secondary | ICD-10-CM

## 2013-07-07 NOTE — Progress Notes (Signed)
P=93 

## 2013-07-09 ENCOUNTER — Inpatient Hospital Stay (HOSPITAL_COMMUNITY)
Admission: AD | Admit: 2013-07-09 | Discharge: 2013-07-09 | Disposition: A | Discharge status: Home / Self Care | Payer: BC Managed Care – PPO | Source: Ambulatory Visit | Attending: Obstetrics & Gynecology | Admitting: Obstetrics & Gynecology

## 2013-07-09 ENCOUNTER — Encounter (HOSPITAL_COMMUNITY): Payer: Self-pay

## 2013-07-09 DIAGNOSIS — R51 Headache: Secondary | ICD-10-CM

## 2013-07-09 DIAGNOSIS — H538 Other visual disturbances: Secondary | ICD-10-CM

## 2013-07-09 DIAGNOSIS — O4700 False labor before 37 completed weeks of gestation, unspecified trimester: Secondary | ICD-10-CM

## 2013-07-09 DIAGNOSIS — O30009 Twin pregnancy, unspecified number of placenta and unspecified number of amniotic sacs, unspecified trimester: Secondary | ICD-10-CM

## 2013-07-09 DIAGNOSIS — O99891 Other specified diseases and conditions complicating pregnancy: Secondary | ICD-10-CM

## 2013-07-09 LAB — URINALYSIS, ROUTINE W REFLEX MICROSCOPIC
Ketones, ur: 15 mg/dL — AB
Nitrite: NEGATIVE
Protein, ur: NEGATIVE mg/dL
Urobilinogen, UA: 0.2 mg/dL (ref 0.0–1.0)

## 2013-07-09 LAB — URINE MICROSCOPIC-ADD ON

## 2013-07-09 MED ORDER — ACETAMINOPHEN 325 MG PO TABS
650.0000 mg | ORAL_TABLET | Freq: Once | ORAL | Status: AC
  Administered 2013-07-09: 650 mg via ORAL
  Filled 2013-07-09: qty 2

## 2013-07-09 NOTE — Progress Notes (Signed)
9/22 NST reviewed and reactive x 2

## 2013-07-09 NOTE — MAU Note (Signed)
Patient is in with c/o persistent headache (she states that she have not been eating well since yesterday), blurred vision and irregular contractions. She denies epigastric pain, no atypical swelling. She reports good fetal movements. She denies vaginal bleeding  Or lof.  Twin gestation

## 2013-07-09 NOTE — MAU Provider Note (Signed)
History     CSN: [id number]  Arrival date and time: 07/09/13 1150   None     Chief Complaint  Patient presents with  . Headache  . Blurred Vision  . Contractions   HPI Comments: Holly Vazquez is a 32 y.o. G5P4004 at [redacted]w[redacted]d who presents today because of blurry vision that occurred around 1000 this morning. There has been one episode today and it lasted about 15 seconds. She describes it as her right visual field being blurry. She had a similar episode happen yesterday on her way home from school which also resolved. She reports having a headache that started yesterday and went away. She has not been eating regularly. She has been having some abdominal pain intermittently since Sunday occuring irregularly every 30 minutes with them going away if she changes positions. She describes the pain as getting worse. She reports no nausea, no vomiting. She has 5 bowel movements per day since Sunday. No constipation. No LOF, no vaginal bleeding. Good fetal movement.   OB History   Grav Para Term Preterm Abortions TAB SAB Ect Mult Living   5 4 4  0 0 0 0 0 0 4      Past Medical History  Diagnosis Date  . Restless legs syndrome (RLS)   . Urinary tract infection, site not specified   . Absence of menstruation   . Contact dermatitis and other eczema, due to unspecified cause   . Anxiety   . Depression   . Heart murmur     States always told it was from anemia  . Anemia     due to heavy menses, on iron    Past Surgical History  Procedure Laterality Date  . Wisdom tooth extraction      Family History  Problem Relation Age of Onset  . Hypertension Father   . Heart disease Maternal Grandmother   . Stroke Maternal Grandmother   . Hypertension Maternal Grandmother     History  Substance Use Topics  . Smoking status: Never Smoker   . Smokeless tobacco: Never Used  . Alcohol Use: No    Allergies:  Allergies  Allergen Reactions  . Latex Hives  . Nickel Dermatitis    trigger     Prescriptions prior to admission  Medication Sig Dispense Refill  . acetaminophen (TYLENOL) 500 MG tablet Take 500 mg by mouth every 6 (six) hours as needed for pain.      . Iron-Vitamin C (VITRON-C) 65-125 MG TABS Take by mouth.      . Phytosterol-Fish Oil-EPA-DHA 500 MG CAPS Take by mouth.      . Prenatal Vitamins (DIS) TABS Take 1 tablet by mouth daily.  30 tablet  11    Review of Systems  Constitutional: Negative for fever.  Eyes: Positive for blurred vision.  Gastrointestinal: Positive for abdominal pain and diarrhea. Negative for nausea, vomiting and constipation.  Genitourinary: Negative for dysuria.  Neurological: Negative for headaches.   Physical Exam   Blood pressure 122/66, pulse 89, temperature 98.4 F (36.9 C), temperature source Oral, resp. rate 18, last menstrual period 11/06/2012, SpO2 98.00%.  Physical Exam  Constitutional: She is oriented to person, place, and time. She appears well-developed and well-nourished. No distress.  Cardiovascular: Normal rate, regular rhythm and normal heart sounds.   Respiratory: Effort normal and breath sounds normal. She has no wheezes. She has no rales.  GI: Soft. There is no tenderness. There is no rebound and no guarding.  Neurological: She is alert and  oriented to person, place, and time. She has normal reflexes.  Skin: Skin is warm and dry. She is not diaphoretic. No erythema.    Baby A FHT: 135bpm, moderate variability, +accels, no decels, Category I tracing Baby B FHT: 135bpm, moderate variability, +accels, no decels, Category I tracing UC: none  MAU Course  Procedures  MDM - NST was reactive - UA was not suggestive of UTI  Assessment and Plan  Holly Vazquez is a 32 y.o. G5P4004 at [redacted]w[redacted]d who presents with headache most likely from decreased food intake  Plan - encouraged to eat and drink water regularly - labor precautions - follow-up at regular scheduled appointment tomorrow - encouraged to return if  symptoms worsen - discharge home  Jacquelin Hawking 07/09/2013, 2:24 PM   Evaluation and management procedures were performed by Resident physician under my supervision/collaboration. Chart reviewed, patient examined by me and I agree with management and plan.

## 2013-07-10 ENCOUNTER — Encounter: Payer: Self-pay | Admitting: Obstetrics and Gynecology

## 2013-07-10 ENCOUNTER — Ambulatory Visit (INDEPENDENT_AMBULATORY_CARE_PROVIDER_SITE_OTHER): Payer: BC Managed Care – PPO | Admitting: Obstetrics and Gynecology

## 2013-07-10 VITALS — BP 126/67 | Temp 97.9°F | Wt 179.0 lb

## 2013-07-10 DIAGNOSIS — O9989 Other specified diseases and conditions complicating pregnancy, childbirth and the puerperium: Secondary | ICD-10-CM

## 2013-07-10 DIAGNOSIS — O2612 Low weight gain in pregnancy, second trimester: Secondary | ICD-10-CM

## 2013-07-10 DIAGNOSIS — O30009 Twin pregnancy, unspecified number of placenta and unspecified number of amniotic sacs, unspecified trimester: Secondary | ICD-10-CM

## 2013-07-10 DIAGNOSIS — Z3483 Encounter for supervision of other normal pregnancy, third trimester: Secondary | ICD-10-CM

## 2013-07-10 DIAGNOSIS — D649 Anemia, unspecified: Secondary | ICD-10-CM

## 2013-07-10 NOTE — Progress Notes (Signed)
P=78 , Patient reports came to MAU yesterday for c/o headache, blurry vision. Denies both today.

## 2013-07-10 NOTE — Progress Notes (Signed)
NST reviewed and reactive x 2. Patient doing well without complaints. F/U US scheduled on 10/1. Patient interested in vaginal birth (vertex/breech on last scan). FM/PTL precautions reviewed

## 2013-07-14 ENCOUNTER — Ambulatory Visit (INDEPENDENT_AMBULATORY_CARE_PROVIDER_SITE_OTHER): Payer: BC Managed Care – PPO | Admitting: *Deleted

## 2013-07-14 VITALS — BP 124/67

## 2013-07-14 DIAGNOSIS — O30009 Twin pregnancy, unspecified number of placenta and unspecified number of amniotic sacs, unspecified trimester: Secondary | ICD-10-CM

## 2013-07-14 NOTE — Progress Notes (Signed)
NST reviewed and reactive.  Fia Hebert L. Harraway-Smith, M.D., FACOG    

## 2013-07-14 NOTE — Progress Notes (Signed)
P = 91 

## 2013-07-16 ENCOUNTER — Ambulatory Visit (HOSPITAL_COMMUNITY)
Admission: RE | Admit: 2013-07-16 | Discharge: 2013-07-16 | Disposition: A | Discharge status: Home / Self Care | Payer: BC Managed Care – PPO | Source: Ambulatory Visit | Attending: Obstetrics & Gynecology | Admitting: Obstetrics & Gynecology

## 2013-07-16 DIAGNOSIS — Z3689 Encounter for other specified antenatal screening: Secondary | ICD-10-CM | POA: Insufficient documentation

## 2013-07-16 DIAGNOSIS — O30049 Twin pregnancy, dichorionic/diamniotic, unspecified trimester: Secondary | ICD-10-CM

## 2013-07-16 DIAGNOSIS — O30009 Twin pregnancy, unspecified number of placenta and unspecified number of amniotic sacs, unspecified trimester: Secondary | ICD-10-CM | POA: Diagnosis not present

## 2013-07-16 NOTE — Progress Notes (Signed)
Holly Vazquez  was seen today for an ultrasound appointment.  See full report in [facility].  Impression: Dichorionic/ Diamniotic twin gestation with best dates of 36 1/7 weeks Interval growth is concordant   Twin A: maternal left, cephalic, anterior placenta The estimated fetal weight today is at the 22nd %tile.   Normal amniotic fluid volume  Twin B: Breech, maternal right, posterior placenta Fetal growth is appropriate (23rd %tile) Normal amniotic fluid volume  Recommendations: Follow-up ultrasounds as clinically indicated.  Consider delivery at 38 weeks if undelivered by that time.  Alpha Gula, MD

## 2013-07-17 ENCOUNTER — Ambulatory Visit (INDEPENDENT_AMBULATORY_CARE_PROVIDER_SITE_OTHER): Payer: BC Managed Care – PPO | Admitting: Obstetrics and Gynecology

## 2013-07-17 ENCOUNTER — Encounter: Payer: Self-pay | Admitting: Obstetrics and Gynecology

## 2013-07-17 VITALS — BP 119/72 | Wt 180.4 lb

## 2013-07-17 DIAGNOSIS — O30009 Twin pregnancy, unspecified number of placenta and unspecified number of amniotic sacs, unspecified trimester: Secondary | ICD-10-CM

## 2013-07-17 DIAGNOSIS — Z3483 Encounter for supervision of other normal pregnancy, third trimester: Secondary | ICD-10-CM

## 2013-07-17 DIAGNOSIS — O9989 Other specified diseases and conditions complicating pregnancy, childbirth and the puerperium: Secondary | ICD-10-CM

## 2013-07-17 DIAGNOSIS — O2612 Low weight gain in pregnancy, second trimester: Secondary | ICD-10-CM

## 2013-07-17 DIAGNOSIS — D649 Anemia, unspecified: Secondary | ICD-10-CM

## 2013-07-17 LAB — POCT URINALYSIS DIP (DEVICE)
Bilirubin Urine: NEGATIVE
Glucose, UA: NEGATIVE mg/dL
Hgb urine dipstick: NEGATIVE
Ketones, ur: 40 mg/dL — AB
Leukocytes, UA: NEGATIVE
Nitrite: NEGATIVE
Protein, ur: NEGATIVE mg/dL
Specific Gravity, Urine: 1.015 (ref 1.005–1.030)
pH: 7 (ref 5.0–8.0)

## 2013-07-17 LAB — OB RESULTS CONSOLE GC/CHLAMYDIA: Gonorrhea: NEGATIVE

## 2013-07-17 LAB — OB RESULTS CONSOLE GBS: GBS: NEGATIVE

## 2013-07-17 NOTE — Progress Notes (Signed)
P = 95   Pt reports increased pelvic pressure and pain

## 2013-07-17 NOTE — Progress Notes (Signed)
NST reviewed and reactive x 2. 10/1 ultrasound demonstrates vertex/breech fetuses 5lb 6 oz each. Patient desires vaginal birth. Cultures collected today. Patient declined Flu vaccine. FM/labor precautions reviewed

## 2013-07-18 LAB — GC/CHLAMYDIA PROBE AMP: GC Probe RNA: NEGATIVE

## 2013-07-20 LAB — CULTURE, BETA STREP (GROUP B ONLY)

## 2013-07-21 ENCOUNTER — Ambulatory Visit (INDEPENDENT_AMBULATORY_CARE_PROVIDER_SITE_OTHER): Payer: BC Managed Care – PPO | Admitting: *Deleted

## 2013-07-21 ENCOUNTER — Encounter: Payer: Self-pay | Admitting: Obstetrics and Gynecology

## 2013-07-21 VITALS — BP 126/58

## 2013-07-21 DIAGNOSIS — O30009 Twin pregnancy, unspecified number of placenta and unspecified number of amniotic sacs, unspecified trimester: Secondary | ICD-10-CM

## 2013-07-21 NOTE — Progress Notes (Signed)
NST reviewed and reactive.  Rhyanna Sorce L. Harraway-Smith, M.D., FACOG    

## 2013-07-21 NOTE — Progress Notes (Signed)
P = 87 

## 2013-07-22 ENCOUNTER — Inpatient Hospital Stay (HOSPITAL_COMMUNITY)
Admission: AD | Admit: 2013-07-22 | Discharge: 2013-07-22 | Disposition: A | Discharge status: Home / Self Care | Payer: BC Managed Care – PPO | Source: Ambulatory Visit | Attending: Obstetrics & Gynecology | Admitting: Obstetrics & Gynecology

## 2013-07-22 ENCOUNTER — Encounter (HOSPITAL_COMMUNITY): Payer: Self-pay | Admitting: *Deleted

## 2013-07-22 DIAGNOSIS — O4700 False labor before 37 completed weeks of gestation, unspecified trimester: Secondary | ICD-10-CM | POA: Insufficient documentation

## 2013-07-22 DIAGNOSIS — O30009 Twin pregnancy, unspecified number of placenta and unspecified number of amniotic sacs, unspecified trimester: Secondary | ICD-10-CM | POA: Insufficient documentation

## 2013-07-22 NOTE — MAU Note (Signed)
Pt. Was going to walk for an hour and then get rechecked, she then stated that her contractions are spacing and aren't as intense, so she wants to go home and will come back if her contractions become more intense and closer together.

## 2013-07-22 NOTE — MAU Note (Signed)
PT SAYS HURT BAD AT 9PM.      FIRST BABY IS    VERTEX,  SECOND IS BREECH-  PLAN IS VAG DEL.Marland Kitchen  LAST  Thursday- VE 2 CM. DENIES HSV AND MRSA.  DENIES  ANY INTERNATIONAL/ S/S TRAVELING.

## 2013-07-24 ENCOUNTER — Ambulatory Visit (INDEPENDENT_AMBULATORY_CARE_PROVIDER_SITE_OTHER): Payer: BC Managed Care – PPO | Admitting: Family Medicine

## 2013-07-24 VITALS — BP 117/63 | Wt 179.8 lb

## 2013-07-24 DIAGNOSIS — O30009 Twin pregnancy, unspecified number of placenta and unspecified number of amniotic sacs, unspecified trimester: Secondary | ICD-10-CM

## 2013-07-24 LAB — POCT URINALYSIS DIP (DEVICE)
Bilirubin Urine: NEGATIVE
Hgb urine dipstick: NEGATIVE
Protein, ur: NEGATIVE mg/dL
Specific Gravity, Urine: 1.02 (ref 1.005–1.030)
Urobilinogen, UA: 0.2 mg/dL (ref 0.0–1.0)

## 2013-07-24 NOTE — Progress Notes (Signed)
IOL 08/02/13 at 7 am.

## 2013-07-24 NOTE — Patient Instructions (Signed)
Pregnancy - Third Trimester The third trimester of pregnancy (the last 3 months) is a period of the most rapid growth for you and your baby. The baby approaches a length of 20 inches and a weight of 6 to 10 pounds. The baby is adding on fat and getting ready for life outside your body. While inside, babies have periods of sleeping and waking, sucking thumbs, and hiccuping. You can often feel small contractions of the uterus. This is false labor. It is also called Braxton-Hicks contractions. This is like a practice for labor. The usual problems in this stage of pregnancy include more difficulty breathing, swelling of the hands and feet from water retention, and having to urinate more often because of the uterus and baby pressing on your bladder.  PRENATAL EXAMS  Blood work may continue to be done during prenatal exams. These tests are done to check on your health and the probable health of your baby. Blood work is used to follow your blood levels (hemoglobin). Anemia (low hemoglobin) is common during pregnancy. Iron and vitamins are given to help prevent this. You may also continue to be checked for diabetes. Some of the past blood tests may be done again.  The size of the uterus is measured during each visit. This makes sure your baby is growing properly according to your pregnancy dates.  Your blood pressure is checked every prenatal visit. This is to make sure you are not getting toxemia.  Your urine is checked every prenatal visit for infection, diabetes, and protein.  Your weight is checked at each visit. This is done to make sure gains are happening at the suggested rate and that you and your baby are growing normally.  Sometimes, an ultrasound is performed to confirm the position and the proper growth and development of the baby. This is a test done that bounces harmless sound waves off the baby so your caregiver can more accurately determine a due date.  Discuss the type of pain medicine and  anesthesia you will have during your labor and delivery.  Discuss the possibility and anesthesia if a cesarean section might be necessary.  Inform your caregiver if there is any mental or physical violence at home. Sometimes, a specialized non-stress test, contraction stress test, and biophysical profile are done to make sure the baby is not having a problem. Checking the amniotic fluid surrounding the baby is called an amniocentesis. The amniotic fluid is removed by sticking a needle into the belly (abdomen). This is sometimes done near the end of pregnancy if an early delivery is required. In this case, it is done to help make sure the baby's lungs are mature enough for the baby to live outside of the womb. If the lungs are not mature and it is unsafe to deliver the baby, an injection of cortisone medicine is given to the mother 1 to 2 days before the delivery. This helps the baby's lungs mature and makes it safer to deliver the baby. CHANGES OCCURING IN THE THIRD TRIMESTER OF PREGNANCY Your body goes through many changes during pregnancy. They vary from person to person. Talk to your caregiver about changes you notice and are concerned about.  During the last trimester, you have probably had an increase in your appetite. It is normal to have cravings for certain foods. This varies from person to person and pregnancy to pregnancy.  You may begin to get stretch marks on your hips, abdomen, and breasts. These are normal changes in the body   during pregnancy. There are no exercises or medicines to take which prevent this change.  Constipation may be treated with a stool softener or adding bulk to your diet. Drinking lots of fluids, fiber in vegetables, fruits, and whole grains are helpful.  Exercising is also helpful. If you have been very active up until your pregnancy, most of these activities can be continued during your pregnancy. If you have been less active, it is helpful to start an exercise  program such as walking. Consult your caregiver before starting exercise programs.  Avoid all smoking, alcohol, non-prescribed drugs, herbs and "street drugs" during your pregnancy. These chemicals affect the formation and growth of the baby. Avoid chemicals throughout the pregnancy to ensure the delivery of a healthy infant.  Backache, varicose veins, and hemorrhoids may develop or get worse.  You will tire more easily in the third trimester, which is normal.  The baby's movements may be stronger and more often.  You may become short of breath easily.  Your belly button may stick out.  A yellow discharge may leak from your breasts called colostrum.  You may have a bloody mucus discharge. This usually occurs a few days to a week before labor begins. HOME CARE INSTRUCTIONS   Keep your caregiver's appointments. Follow your caregiver's instructions regarding medicine use, exercise, and diet.  During pregnancy, you are providing food for you and your baby. Continue to eat regular, well-balanced meals. Choose foods such as meat, fish, milk and other low fat dairy products, vegetables, fruits, and whole-grain breads and cereals. Your caregiver will tell you of the ideal weight gain.  A physical sexual relationship may be continued throughout pregnancy if there are no other problems such as early (premature) leaking of amniotic fluid from the membranes, vaginal bleeding, or belly (abdominal) pain.  Exercise regularly if there are no restrictions. Check with your caregiver if you are unsure of the safety of your exercises. Greater weight gain will occur in the last 2 trimesters of pregnancy. Exercising helps:  Control your weight.  Get you in shape for labor and delivery.  You lose weight after you deliver.  Rest a lot with legs elevated, or as needed for leg cramps or low back pain.  Wear a good support or jogging bra for breast tenderness during pregnancy. This may help if worn during  sleep. Pads or tissues may be used in the bra if you are leaking colostrum.  Do not use hot tubs, steam rooms, or saunas.  Wear your seat belt when driving. This protects you and your baby if you are in an accident.  Avoid raw meat, cat litter boxes and soil used by cats. These carry germs that can cause birth defects in the baby.  It is easier to leak urine during pregnancy. Tightening up and strengthening the pelvic muscles will help with this problem. You can practice stopping your urination while you are going to the bathroom. These are the same muscles you need to strengthen. It is also the muscles you would use if you were trying to stop from passing gas. You can practice tightening these muscles up 10 times a set and repeating this about 3 times per day. Once you know what muscles to tighten up, do not perform these exercises during urination. It is more likely to cause an infection by backing up the urine.  Ask for help if you have financial, counseling, or nutritional needs during pregnancy. Your caregiver will be able to offer counseling for these   needs as well as refer you for other special needs.  Make a list of emergency phone numbers and have them available.  Plan on getting help from family or friends when you go home from the hospital.  Make a trial run to the hospital.  Take prenatal classes with the father to understand, practice, and ask questions about the labor and delivery.  Prepare the baby's room or nursery.  Do not travel out of the city unless it is absolutely necessary and with the advice of your caregiver.  Wear only low or no heal shoes to have better balance and prevent falling. MEDICINES AND DRUG USE IN PREGNANCY  Take prenatal vitamins as directed. The vitamin should contain 1 milligram of folic acid. Keep all vitamins out of reach of children. Only a couple vitamins or tablets containing iron may be fatal to a baby or young child when ingested.  Avoid use  of all medicines, including herbs, over-the-counter medicines, not prescribed or suggested by your caregiver. Only take over-the-counter or prescription medicines for pain, discomfort, or fever as directed by your caregiver. Do not use aspirin, ibuprofen or naproxen unless approved by your caregiver.  Let your caregiver also know about herbs you may be using.  Alcohol is related to a number of birth defects. This includes fetal alcohol syndrome. All alcohol, in any form, should be avoided completely. Smoking will cause low birth rate and premature babies.  Illegal drugs are very harmful to the baby. They are absolutely forbidden. A baby born to an addicted mother will be addicted at birth. The baby will go through the same withdrawal an adult does. SEEK MEDICAL CARE IF: You have any concerns or worries during your pregnancy. It is better to call with your questions if you feel they cannot wait, rather than worry about them. SEEK IMMEDIATE MEDICAL CARE IF:   An unexplained oral temperature above 102 F (38.9 C) develops, or as your caregiver suggests.  You have leaking of fluid from the vagina. If leaking membranes are suspected, take your temperature and tell your caregiver of this when you call.  There is vaginal spotting, bleeding or passing clots. Tell your caregiver of the amount and how many pads are used.  You develop a bad smelling vaginal discharge with a change in the color from clear to white.  You develop vomiting that lasts more than 24 hours.  You develop chills or fever.  You develop shortness of breath.  You develop burning on urination.  You loose more than 2 pounds of weight or gain more than 2 pounds of weight or as suggested by your caregiver.  You notice sudden swelling of your face, hands, and feet or legs.  You develop belly (abdominal) pain. Round ligament discomfort is a common non-cancerous (benign) cause of abdominal pain in pregnancy. Your caregiver still  must evaluate you.  You develop a severe headache that does not go away.  You develop visual problems, blurred or double vision.  If you have not felt your baby move for more than 1 hour. If you think the baby is not moving as much as usual, eat something with sugar in it and lie down on your left side for an hour. The baby should move at least 4 to 5 times per hour. Call right away if your baby moves less than that.  You fall, are in a car accident, or any kind of trauma.  There is mental or physical violence at home. Document Released: 09/26/2001   Document Revised: 06/26/2012 Document Reviewed: 03/31/2009 [employer] Patient Information 2014 [employer].  Breastfeeding A change in hormones during your pregnancy causes growth of your breast tissue and an increase in number and size of milk ducts. The hormone prolactin allows proteins, sugars, and fats from your blood supply to make breast milk in your milk-producing glands. The hormone progesterone prevents breast milk from being released before the birth of your baby. After the birth of your baby, your progesterone level decreases allowing breast milk to be released. Thoughts of your baby, as well as his or her sucking or crying, can stimulate the release of milk from the milk-producing glands. Deciding to breastfeed (nurse) is one of the best choices you can make for you and your baby. The information that follows gives a brief review of the benefits, as well as other important skills to know about breastfeeding. BENEFITS OF BREASTFEEDING For your baby  The first milk (colostrum) helps your baby's digestive system function better.   There are antibodies in your milk that help your baby fight off infections.   Your baby has a lower incidence of asthma, allergies, and sudden infant death syndrome (SIDS).   The nutrients in breast milk are better for your baby than infant formulas.  Breast milk improves your baby's brain development.    Your baby will have less gas, colic, and constipation.  Your baby is less likely to develop other conditions, such as childhood obesity, asthma, or diabetes mellitus. For you  Breastfeeding helps develop a very special bond between you and your baby.   Breastfeeding is convenient, always available at the correct temperature, and costs nothing.   Breastfeeding helps to burn calories and helps you lose the weight gained during pregnancy.   Breastfeeding makes your uterus contract back down to normal size faster and slows bleeding following delivery.   Breastfeeding mothers have a lower risk of developing osteoporosis or breast or ovarian cancer later in life.  BREASTFEEDING FREQUENCY  A healthy, full-term baby may breastfeed as often as every hour or space his or her feedings to every 3 hours. Breastfeeding frequency will vary from baby to baby.   Newborns should be fed no less than every 2 3 hours during the day and every 4 5 hours during the night. You should breastfeed a minimum of 8 feedings in a 24 hour period.  Awaken your baby to breastfeed if it has been 3 4 hours since the last feeding.  Breastfeed when you feel the need to reduce the fullness of your breasts or when your newborn shows signs of hunger. Signs that your baby may be hungry include:  Increased alertness or activity.  Stretching.  Movement of the head from side to side.  Movement of the head and opening of the mouth when the corner of the mouth or cheek is stroked (rooting).  Increased sucking sounds, smacking lips, cooing, sighing, or squeaking.  Hand-to-mouth movements.  Increased sucking of fingers or hands.  Fussing.  Intermittent crying.  Signs of extreme hunger will require calming and consoling before you try to feed your baby. Signs of extreme hunger may include:  Restlessness.  A loud, strong cry.  Screaming.  Frequent feeding will help you make more milk and will help prevent  problems, such as sore nipples and engorgement of the breasts.  BREASTFEEDING   Whether lying down or sitting, be sure that the baby's abdomen is facing your abdomen.   Support your breast with 4 fingers under your breast   and your thumb above your nipple. Make sure your fingers are well away from your nipple and your baby's mouth.   Stroke your baby's lips gently with your finger or nipple.   When your baby's mouth is open wide enough, place all of your nipple and as much of the colored area around your nipple (areola) as possible into your baby's mouth.  More areola should be visible above his or her upper lip than below his or her lower lip.  Your baby's tongue should be between his or her lower gum and your breast.  Ensure that your baby's mouth is correctly positioned around the nipple (latched). Your baby's lips should create a seal on your breast.  Signs that your baby has effectively latched onto your nipple include:  Tugging or sucking without pain.  Swallowing heard between sucks.  Absent click or smacking sound.  Muscle movement above and in front of his or her ears with sucking.  Your baby must suck about 2 3 minutes in order to get your milk. Allow your baby to feed on each breast as long as he or she wants. Nurse your baby until he or she unlatches or falls asleep at the first breast, then offer the second breast.  Signs that your baby is full and satisfied include:  A gradual decrease in the number of sucks or complete cessation of sucking.  Falling asleep.  Extension or relaxation of his or her body.  Retention of a small amount of milk in his or her mouth.  Letting go of your breast by himself or herself.  Signs of effective breastfeeding in you include:  Breasts that have increased firmness, weight, and size prior to feeding.  Breasts that are softer after nursing.  Increased milk volume, as well as a change in milk consistency and color by the 5th  day of breastfeeding.  Breast fullness relieved by breastfeeding.  Nipples are not sore, cracked, or bleeding.  If needed, break the suction by putting your finger into the corner of your baby's mouth and sliding your finger between his or her gums. Then, remove your breast from his or her mouth.  It is common for babies to spit up a small amount after a feeding.  Babies often swallow air during feeding. This can make babies fussy. Burping your baby between breasts can help with this.  Vitamin D supplements are recommended for babies who get only breast milk.  Avoid using a pacifier during your baby's first 4 6 weeks.  Avoid supplemental feedings of water, formula, or juice in place of breastfeeding. Breast milk is all the food your baby needs. It is not necessary for your baby to have water or formula. Your breasts will make more milk if supplemental feedings are avoided during the early weeks. HOW TO TELL WHETHER YOUR BABY IS GETTING ENOUGH BREAST MILK Wondering whether or not your baby is getting enough milk is a common concern among mothers. You can be assured that your baby is getting enough milk if:   Your baby is actively sucking and you hear swallowing.   Your baby seems relaxed and satisfied after a feeding.   Your baby nurses at least 8 12 times in a 24 hour time period.  During the first 3 5 days of age:  Your baby is wetting at least 3 5 diapers in a 24 hour period. The urine should be clear and pale yellow.  Your baby is having at least 3 4 stools in   a 24 hour period. The stool should be soft and yellow.  At 5 7 days of age, your baby is having at least 3 6 stools in a 24 hour period. The stool should be seedy and yellow by 5 days of age.  Your baby has a weight loss less than 7 10% during the first 3 days of age.  Your baby does not lose weight after 3 7 days of age.  Your baby gains 4 7 ounces each week after he or she is 4 days of age.  Your baby gains weight  by 5 days of age and is back to birth weight within 2 weeks. ENGORGEMENT In the first week after your baby is born, you may experience extremely full breasts (engorgement). When engorged, your breasts may feel heavy, warm, or tender to the touch. Engorgement peaks within 24 48 hours after delivery of your baby.  Engorgement may be reduced by:  Continuing to breastfeed.  Increasing the frequency of breastfeeding.  Taking warm showers or applying warm, moist heat to your breasts just before each feeding. This increases circulation and helps the milk flow.   Gently massaging your breast before and during the feedings. With your fingertips, massage from your chest wall towards your nipple in a circular motion.   Ensuring that your baby empties at least one breast at every feeding. It also helps to start the next feeding on the opposite breast.   Expressing breast milk by hand or by using a breast pump to empty the breasts if your baby is sleepy, or not nursing well. You may also want to express milk if you are returning to work oryou feel you are getting engorged.  Ensuring your baby is latched on and positioned properly while breastfeeding. If you follow these suggestions, your engorgement should improve in 24 48 hours. If you are still experiencing difficulty, call your lactation consultant or caregiver.  CARING FOR YOURSELF Take care of your breasts.  Bathe or shower daily.   Avoid using soap on your nipples.   Wear a supportive bra. Avoid wearing underwire style bras.  Air dry your nipples for a 3 4minutes after each feeding.   Use only cotton bra pads to absorb breast milk leakage. Leaking of breast milk between feedings is normal.   Use only pure lanolin on your nipples after nursing. You do not need to wash it off before feeding your baby again. Another option is to express a few drops of breast milk and gently massage that milk into your nipples.  Continue breast  self-awareness checks. Take care of yourself.  Eat healthy foods. Alternate 3 meals with 3 snacks.  Avoid foods that you notice affect your baby in a bad way.  Drink milk, fruit juice, and water to satisfy your thirst (about 8 glasses a day).   Rest often, relax, and take your prenatal vitamins to prevent fatigue, stress, and anemia.  Avoid chewing and smoking tobacco.  Avoid alcohol and drug use.  Take over-the-counter and prescribed medicine only as directed by your caregiver or pharmacist. You should always check with your caregiver or pharmacist before taking any new medicine, vitamin, or herbal supplement.  Know that pregnancy is possible while breastfeeding. If desired, talk to your caregiver about family planning and safe birth control methods that may be used while breastfeeding. SEEK MEDICAL CARE IF:   You feel like you want to stop breastfeeding or have become frustrated with breastfeeding.  You have painful breasts or nipples.    Your nipples are cracked or bleeding.  Your breasts are red, tender, or warm.  You have a swollen area on either breast.  You have a fever or chills.  You have nausea or vomiting.  You have drainage from your nipples.  Your breasts do not become full before feedings by the 5th day after delivery.  You feel sad and depressed.  Your baby is too sleepy to eat well.  Your baby is having trouble sleeping.   Your baby is wetting less than 3 diapers in a 24 hour period.  Your baby has less than 3 stools in a 24 hour period.  Your baby's skin or the white part of his or her eyes becomes more yellow.   Your baby is not gaining weight by 5 days of age. MAKE SURE YOU:   Understand these instructions.  Will watch your condition.  Will get help right away if you are not doing well or get worse. Document Released: 10/02/2005 Document Revised: 06/26/2012 Document Reviewed: 05/08/2012 [employer] Patient Information 2014 [employer].  

## 2013-07-24 NOTE — Progress Notes (Signed)
Pulse- 80 Patient reports pelvic pressure and irregular contractions 10 minutes apart

## 2013-07-24 NOTE — Progress Notes (Signed)
Vtx, breech, for IOL on 10/18 NST reviewed and reactive x 2.

## 2013-07-28 ENCOUNTER — Ambulatory Visit (INDEPENDENT_AMBULATORY_CARE_PROVIDER_SITE_OTHER): Payer: BC Managed Care – PPO | Admitting: *Deleted

## 2013-07-28 VITALS — BP 128/68

## 2013-07-28 DIAGNOSIS — O30009 Twin pregnancy, unspecified number of placenta and unspecified number of amniotic sacs, unspecified trimester: Secondary | ICD-10-CM

## 2013-07-28 NOTE — Progress Notes (Signed)
P-90 

## 2013-07-29 ENCOUNTER — Telehealth (HOSPITAL_COMMUNITY): Payer: Self-pay | Admitting: *Deleted

## 2013-07-29 ENCOUNTER — Encounter (HOSPITAL_COMMUNITY): Payer: Self-pay | Admitting: *Deleted

## 2013-07-29 NOTE — Telephone Encounter (Signed)
Preadmission screen  

## 2013-07-29 NOTE — Progress Notes (Signed)
10/13 NST reviewed and reactive 

## 2013-07-31 ENCOUNTER — Ambulatory Visit (INDEPENDENT_AMBULATORY_CARE_PROVIDER_SITE_OTHER): Payer: BC Managed Care – PPO | Admitting: Family Medicine

## 2013-07-31 VITALS — BP 124/74 | Wt 180.1 lb

## 2013-07-31 DIAGNOSIS — O30009 Twin pregnancy, unspecified number of placenta and unspecified number of amniotic sacs, unspecified trimester: Secondary | ICD-10-CM

## 2013-07-31 LAB — POCT URINALYSIS DIP (DEVICE)
Glucose, UA: NEGATIVE mg/dL
Hgb urine dipstick: NEGATIVE
Ketones, ur: 40 mg/dL — AB
Protein, ur: NEGATIVE mg/dL
Specific Gravity, Urine: 1.015 (ref 1.005–1.030)
Urobilinogen, UA: 0.2 mg/dL (ref 0.0–1.0)

## 2013-07-31 NOTE — Progress Notes (Signed)
P = 91   IOL scheduled 10/18 @ 0700

## 2013-07-31 NOTE — Progress Notes (Signed)
Doing well--for IOL Saturday.

## 2013-07-31 NOTE — Patient Instructions (Signed)
Breastfeeding A change in hormones during your pregnancy causes growth of your breast tissue and an increase in number and size of milk ducts. The hormone prolactin allows proteins, sugars, and fats from your blood supply to make breast milk in your milk-producing glands. The hormone progesterone prevents breast milk from being released before the birth of your baby. After the birth of your baby, your progesterone level decreases allowing breast milk to be released. Thoughts of your baby, as well as his or her sucking or crying, can stimulate the release of milk from the milk-producing glands. Deciding to breastfeed (nurse) is one of the best choices you can make for you and your baby. The information that follows gives a brief review of the benefits, as well as other important skills to know about breastfeeding. BENEFITS OF BREASTFEEDING For your baby  The first milk (colostrum) helps your baby's digestive system function better.   There are antibodies in your milk that help your baby fight off infections.   Your baby has a lower incidence of asthma, allergies, and sudden infant death syndrome (SIDS).   The nutrients in breast milk are better for your baby than infant formulas.  Breast milk improves your baby's brain development.   Your baby will have less gas, colic, and constipation.  Your baby is less likely to develop other conditions, such as childhood obesity, asthma, or diabetes mellitus. For you  Breastfeeding helps develop a very special bond between you and your baby.   Breastfeeding is convenient, always available at the correct temperature, and costs nothing.   Breastfeeding helps to burn calories and helps you lose the weight gained during pregnancy.   Breastfeeding makes your uterus contract back down to normal size faster and slows bleeding following delivery.   Breastfeeding mothers have a lower risk of developing osteoporosis or breast or ovarian cancer later  in life.  BREASTFEEDING FREQUENCY  A healthy, full-term baby may breastfeed as often as every hour or space his or her feedings to every 3 hours. Breastfeeding frequency will vary from baby to baby.   Newborns should be fed no less than every 2 3 hours during the day and every 4 5 hours during the night. You should breastfeed a minimum of 8 feedings in a 24 hour period.  Awaken your baby to breastfeed if it has been 3 4 hours since the last feeding.  Breastfeed when you feel the need to reduce the fullness of your breasts or when your newborn shows signs of hunger. Signs that your baby may be hungry include:  Increased alertness or activity.  Stretching.  Movement of the head from side to side.  Movement of the head and opening of the mouth when the corner of the mouth or cheek is stroked (rooting).  Increased sucking sounds, smacking lips, cooing, sighing, or squeaking.  Hand-to-mouth movements.  Increased sucking of fingers or hands.  Fussing.  Intermittent crying.  Signs of extreme hunger will require calming and consoling before you try to feed your baby. Signs of extreme hunger may include:  Restlessness.  A loud, strong cry.  Screaming.  Frequent feeding will help you make more milk and will help prevent problems, such as sore nipples and engorgement of the breasts.  BREASTFEEDING   Whether lying down or sitting, be sure that the baby's abdomen is facing your abdomen.   Support your breast with 4 fingers under your breast and your thumb above your nipple. Make sure your fingers are well away from   your nipple and your baby's mouth.   Stroke your baby's lips gently with your finger or nipple.   When your baby's mouth is open wide enough, place all of your nipple and as much of the colored area around your nipple (areola) as possible into your baby's mouth.  More areola should be visible above his or her upper lip than below his or her lower lip.  Your  baby's tongue should be between his or her lower gum and your breast.  Ensure that your baby's mouth is correctly positioned around the nipple (latched). Your baby's lips should create a seal on your breast.  Signs that your baby has effectively latched onto your nipple include:  Tugging or sucking without pain.  Swallowing heard between sucks.  Absent click or smacking sound.  Muscle movement above and in front of his or her ears with sucking.  Your baby must suck about 2 3 minutes in order to get your milk. Allow your baby to feed on each breast as long as he or she wants. Nurse your baby until he or she unlatches or falls asleep at the first breast, then offer the second breast.  Signs that your baby is full and satisfied include:  A gradual decrease in the number of sucks or complete cessation of sucking.  Falling asleep.  Extension or relaxation of his or her body.  Retention of a small amount of milk in his or her mouth.  Letting go of your breast by himself or herself.  Signs of effective breastfeeding in you include:  Breasts that have increased firmness, weight, and size prior to feeding.  Breasts that are softer after nursing.  Increased milk volume, as well as a change in milk consistency and color by the 5th day of breastfeeding.  Breast fullness relieved by breastfeeding.  Nipples are not sore, cracked, or bleeding.  If needed, break the suction by putting your finger into the corner of your baby's mouth and sliding your finger between his or her gums. Then, remove your breast from his or her mouth.  It is common for babies to spit up a small amount after a feeding.  Babies often swallow air during feeding. This can make babies fussy. Burping your baby between breasts can help with this.  Vitamin D supplements are recommended for babies who get only breast milk.  Avoid using a pacifier during your baby's first 4 6 weeks.  Avoid supplemental feedings of  water, formula, or juice in place of breastfeeding. Breast milk is all the food your baby needs. It is not necessary for your baby to have water or formula. Your breasts will make more milk if supplemental feedings are avoided during the early weeks. HOW TO TELL WHETHER YOUR BABY IS GETTING ENOUGH BREAST MILK Wondering whether or not your baby is getting enough milk is a common concern among mothers. You can be assured that your baby is getting enough milk if:   Your baby is actively sucking and you hear swallowing.   Your baby seems relaxed and satisfied after a feeding.   Your baby nurses at least 8 12 times in a 24 hour time period.  During the first 3 5 days of age:  Your baby is wetting at least 3 5 diapers in a 24 hour period. The urine should be clear and pale yellow.  Your baby is having at least 3 4 stools in a 24 hour period. The stool should be soft and yellow.  At   5 7 days of age, your baby is having at least 3 6 stools in a 24 hour period. The stool should be seedy and yellow by 5 days of age.  Your baby has a weight loss less than 7 10% during the first 3 days of age.  Your baby does not lose weight after 3 7 days of age.  Your baby gains 4 7 ounces each week after he or she is 4 days of age.  Your baby gains weight by 5 days of age and is back to birth weight within 2 weeks. ENGORGEMENT In the first week after your baby is born, you may experience extremely full breasts (engorgement). When engorged, your breasts may feel heavy, warm, or tender to the touch. Engorgement peaks within 24 48 hours after delivery of your baby.  Engorgement may be reduced by:  Continuing to breastfeed.  Increasing the frequency of breastfeeding.  Taking warm showers or applying warm, moist heat to your breasts just before each feeding. This increases circulation and helps the milk flow.   Gently massaging your breast before and during the feedings. With your fingertips, massage from  your chest wall towards your nipple in a circular motion.   Ensuring that your baby empties at least one breast at every feeding. It also helps to start the next feeding on the opposite breast.   Expressing breast milk by hand or by using a breast pump to empty the breasts if your baby is sleepy, or not nursing well. You may also want to express milk if you are returning to work oryou feel you are getting engorged.  Ensuring your baby is latched on and positioned properly while breastfeeding. If you follow these suggestions, your engorgement should improve in 24 48 hours. If you are still experiencing difficulty, call your lactation consultant or caregiver.  CARING FOR YOURSELF Take care of your breasts.  Bathe or shower daily.   Avoid using soap on your nipples.   Wear a supportive bra. Avoid wearing underwire style bras.  Air dry your nipples for a 3 4minutes after each feeding.   Use only cotton bra pads to absorb breast milk leakage. Leaking of breast milk between feedings is normal.   Use only pure lanolin on your nipples after nursing. You do not need to wash it off before feeding your baby again. Another option is to express a few drops of breast milk and gently massage that milk into your nipples.  Continue breast self-awareness checks. Take care of yourself.  Eat healthy foods. Alternate 3 meals with 3 snacks.  Avoid foods that you notice affect your baby in a bad way.  Drink milk, fruit juice, and water to satisfy your thirst (about 8 glasses a day).   Rest often, relax, and take your prenatal vitamins to prevent fatigue, stress, and anemia.  Avoid chewing and smoking tobacco.  Avoid alcohol and drug use.  Take over-the-counter and prescribed medicine only as directed by your caregiver or pharmacist. You should always check with your caregiver or pharmacist before taking any new medicine, vitamin, or herbal supplement.  Know that pregnancy is possible while  breastfeeding. If desired, talk to your caregiver about family planning and safe birth control methods that may be used while breastfeeding. SEEK MEDICAL CARE IF:   You feel like you want to stop breastfeeding or have become frustrated with breastfeeding.  You have painful breasts or nipples.  Your nipples are cracked or bleeding.  Your breasts are red, tender,   or warm.  You have a swollen area on either breast.  You have a fever or chills.  You have nausea or vomiting.  You have drainage from your nipples.  Your breasts do not become full before feedings by the 5th day after delivery.  You feel sad and depressed.  Your baby is too sleepy to eat well.  Your baby is having trouble sleeping.   Your baby is wetting less than 3 diapers in a 24 hour period.  Your baby has less than 3 stools in a 24 hour period.  Your baby's skin or the white part of his or her eyes becomes more yellow.   Your baby is not gaining weight by 5 days of age. MAKE SURE YOU:   Understand these instructions.  Will watch your condition.  Will get help right away if you are not doing well or get worse. Document Released: 10/02/2005 Document Revised: 06/26/2012 Document Reviewed: 05/08/2012 [employer] Patient Information 2014 [employer].  

## 2013-07-31 NOTE — Progress Notes (Signed)
NST reviewed and reactive x 2 

## 2013-08-02 ENCOUNTER — Inpatient Hospital Stay (HOSPITAL_COMMUNITY): Payer: BC Managed Care – PPO | Admitting: Anesthesiology

## 2013-08-02 ENCOUNTER — Inpatient Hospital Stay (HOSPITAL_COMMUNITY)
Admission: RE | Admit: 2013-08-02 | Discharge: 2013-08-04 | DRG: 775 | Disposition: A | Discharge status: Home / Self Care | Payer: BC Managed Care – PPO | Source: Ambulatory Visit | Attending: Family Medicine | Admitting: Family Medicine

## 2013-08-02 ENCOUNTER — Encounter (HOSPITAL_COMMUNITY): Payer: BC Managed Care – PPO | Admitting: Anesthesiology

## 2013-08-02 ENCOUNTER — Encounter (HOSPITAL_COMMUNITY): Payer: Self-pay

## 2013-08-02 VITALS — BP 111/58 | HR 85 | Temp 98.5°F | Resp 16 | Ht 64.0 in | Wt 180.0 lb

## 2013-08-02 DIAGNOSIS — O329XX0 Maternal care for malpresentation of fetus, unspecified, not applicable or unspecified: Secondary | ICD-10-CM

## 2013-08-02 DIAGNOSIS — O30009 Twin pregnancy, unspecified number of placenta and unspecified number of amniotic sacs, unspecified trimester: Secondary | ICD-10-CM

## 2013-08-02 DIAGNOSIS — O9902 Anemia complicating childbirth: Secondary | ICD-10-CM

## 2013-08-02 DIAGNOSIS — O3090 Multiple gestation, unspecified, unspecified trimester: Secondary | ICD-10-CM

## 2013-08-02 DIAGNOSIS — O30049 Twin pregnancy, dichorionic/diamniotic, unspecified trimester: Secondary | ICD-10-CM

## 2013-08-02 DIAGNOSIS — Z3483 Encounter for supervision of other normal pregnancy, third trimester: Secondary | ICD-10-CM

## 2013-08-02 DIAGNOSIS — D649 Anemia, unspecified: Secondary | ICD-10-CM | POA: Diagnosis present

## 2013-08-02 LAB — COMPREHENSIVE METABOLIC PANEL
ALT: 12 U/L (ref 0–35)
AST: 17 U/L (ref 0–37)
Alkaline Phosphatase: 145 U/L — ABNORMAL HIGH (ref 39–117)
CO2: 20 mEq/L (ref 19–32)
Calcium: 8.6 mg/dL (ref 8.4–10.5)
Chloride: 107 mEq/L (ref 96–112)
GFR calc Af Amer: >90 mL/min (ref >90)
GFR calc non Af Amer: >90 mL/min (ref >90)
Glucose, Bld: 87 mg/dL (ref 70–99)
Potassium: 3.7 mEq/L (ref 3.5–5.1)
Sodium: 139 mEq/L (ref 135–145)
Total Bilirubin: 0.2 mg/dL — ABNORMAL LOW (ref 0.3–1.2)
Total Protein: 5.8 g/dL — ABNORMAL LOW (ref 6.0–8.3)

## 2013-08-02 LAB — RPR: RPR Ser Ql: NONREACTIVE

## 2013-08-02 LAB — TYPE AND SCREEN: ABO/RH(D): B POS

## 2013-08-02 LAB — CBC
HCT: 32.3 % — ABNORMAL LOW (ref 36.0–46.0)
MCH: 22.9 pg — ABNORMAL LOW (ref 26.0–34.0)
MCHC: 32.2 g/dL (ref 30.0–36.0)
RDW: 14.2 % (ref 11.5–15.5)

## 2013-08-02 LAB — ABO/RH: ABO/RH(D): B POS

## 2013-08-02 LAB — PROTEIN / CREATININE RATIO, URINE: Creatinine, Urine: 96.95 mg/dL

## 2013-08-02 MED ORDER — CITRIC ACID-SODIUM CITRATE 334-500 MG/5ML PO SOLN: 30.0000 mL | ORAL | Status: DC | PRN

## 2013-08-02 MED ORDER — OXYTOCIN 40 UNITS IN LACTATED RINGERS INFUSION - SIMPLE MED: 62.5000 mL/h | INTRAVENOUS | Status: DC

## 2013-08-02 MED ORDER — ONDANSETRON HCL 4 MG/2ML IJ SOLN: 4.0000 mg | INTRAMUSCULAR | Status: DC | PRN

## 2013-08-02 MED ORDER — LACTATED RINGERS IV SOLN: 500.0000 mL | INTRAVENOUS | Status: DC | PRN

## 2013-08-02 MED ORDER — FLEET ENEMA 7-19 GM/118ML RE ENEM: 1.0000 | ENEMA | RECTAL | Status: DC | PRN

## 2013-08-02 MED ORDER — OXYTOCIN 40 UNITS IN LACTATED RINGERS INFUSION - SIMPLE MED
1.0000 m[IU]/min | INTRAVENOUS | Status: DC
  Administered 2013-08-02: 2 m[IU]/min via INTRAVENOUS
  Filled 2013-08-02: qty 1000

## 2013-08-02 MED ORDER — SIMETHICONE 80 MG PO CHEW: 80.0000 mg | CHEWABLE_TABLET | ORAL | Status: DC | PRN

## 2013-08-02 MED ORDER — ONDANSETRON HCL 4 MG PO TABS: 4.0000 mg | ORAL_TABLET | ORAL | Status: DC | PRN

## 2013-08-02 MED ORDER — LIDOCAINE HCL (PF) 1 % IJ SOLN
INTRAMUSCULAR | Status: DC | PRN
  Administered 2013-08-02 (×2): 5 mL

## 2013-08-02 MED ORDER — LACTATED RINGERS IV SOLN
INTRAVENOUS | Status: DC
  Administered 2013-08-02 (×2): via INTRAVENOUS

## 2013-08-02 MED ORDER — FENTANYL CITRATE 0.05 MG/ML IJ SOLN: 100.0000 ug | INTRAMUSCULAR | Status: DC | PRN

## 2013-08-02 MED ORDER — EPHEDRINE 5 MG/ML INJ: 10.0000 mg | INTRAVENOUS | Status: DC | PRN

## 2013-08-02 MED ORDER — OXYCODONE-ACETAMINOPHEN 5-325 MG PO TABS
1.0000 | ORAL_TABLET | ORAL | Status: DC | PRN
  Administered 2013-08-02 – 2013-08-04 (×6): 1 via ORAL
  Filled 2013-08-02 (×6): qty 1

## 2013-08-02 MED ORDER — TETANUS-DIPHTH-ACELL PERTUSSIS 5-2.5-18.5 LF-MCG/0.5 IM SUSP: 0.5000 mL | Freq: Once | INTRAMUSCULAR | Status: DC

## 2013-08-02 MED ORDER — PHENYLEPHRINE 40 MCG/ML (10ML) SYRINGE FOR IV PUSH (FOR BLOOD PRESSURE SUPPORT)
80.0000 ug | PREFILLED_SYRINGE | INTRAVENOUS | Status: DC | PRN
  Filled 2013-08-02: qty 5

## 2013-08-02 MED ORDER — IBUPROFEN 600 MG PO TABS
600.0000 mg | ORAL_TABLET | Freq: Four times a day (QID) | ORAL | Status: DC
  Administered 2013-08-02 – 2013-08-04 (×7): 600 mg via ORAL
  Filled 2013-08-02 (×7): qty 1

## 2013-08-02 MED ORDER — PRENATAL MULTIVITAMIN CH
1.0000 | ORAL_TABLET | Freq: Every day | ORAL | Status: DC
  Administered 2013-08-03 – 2013-08-04 (×2): 1 via ORAL
  Filled 2013-08-02 (×2): qty 1

## 2013-08-02 MED ORDER — FENTANYL 2.5 MCG/ML BUPIVACAINE 1/10 % EPIDURAL INFUSION (WH - ANES)
14.0000 mL/h | INTRAMUSCULAR | Status: DC | PRN
  Administered 2013-08-02: 14 mL/h via EPIDURAL
  Filled 2013-08-02: qty 125

## 2013-08-02 MED ORDER — WITCH HAZEL-GLYCERIN EX PADS: 1.0000 "application " | MEDICATED_PAD | CUTANEOUS | Status: DC | PRN

## 2013-08-02 MED ORDER — BENZOCAINE-MENTHOL 20-0.5 % EX AERO
1.0000 "application " | INHALATION_SPRAY | CUTANEOUS | Status: DC | PRN
  Administered 2013-08-02: 1 via TOPICAL
  Filled 2013-08-02: qty 56

## 2013-08-02 MED ORDER — EPHEDRINE 5 MG/ML INJ
10.0000 mg | INTRAVENOUS | Status: DC | PRN
  Filled 2013-08-02: qty 4

## 2013-08-02 MED ORDER — ACETAMINOPHEN 325 MG PO TABS
650.0000 mg | ORAL_TABLET | ORAL | Status: DC | PRN
  Administered 2013-08-02: 650 mg via ORAL
  Filled 2013-08-02: qty 2

## 2013-08-02 MED ORDER — DIPHENHYDRAMINE HCL 50 MG/ML IJ SOLN: 12.5000 mg | INTRAMUSCULAR | Status: DC | PRN

## 2013-08-02 MED ORDER — OXYCODONE-ACETAMINOPHEN 5-325 MG PO TABS: 1.0000 | ORAL_TABLET | ORAL | Status: DC | PRN

## 2013-08-02 MED ORDER — ZOLPIDEM TARTRATE 5 MG PO TABS: 5.0000 mg | ORAL_TABLET | Freq: Every evening | ORAL | Status: DC | PRN

## 2013-08-02 MED ORDER — LACTATED RINGERS IV SOLN: 500.0000 mL | Freq: Once | INTRAVENOUS | Status: DC

## 2013-08-02 MED ORDER — LANOLIN HYDROUS EX OINT: TOPICAL_OINTMENT | CUTANEOUS | Status: DC | PRN

## 2013-08-02 MED ORDER — PHENYLEPHRINE 40 MCG/ML (10ML) SYRINGE FOR IV PUSH (FOR BLOOD PRESSURE SUPPORT): 80.0000 ug | PREFILLED_SYRINGE | INTRAVENOUS | Status: DC | PRN

## 2013-08-02 MED ORDER — IBUPROFEN 600 MG PO TABS: 600.0000 mg | ORAL_TABLET | Freq: Four times a day (QID) | ORAL | Status: DC | PRN

## 2013-08-02 MED ORDER — OXYTOCIN BOLUS FROM INFUSION: 500.0000 mL | INTRAVENOUS | Status: DC

## 2013-08-02 MED ORDER — ONDANSETRON HCL 4 MG/2ML IJ SOLN: 4.0000 mg | Freq: Four times a day (QID) | INTRAMUSCULAR | Status: DC | PRN

## 2013-08-02 MED ORDER — TERBUTALINE SULFATE 1 MG/ML IJ SOLN: 0.2500 mg | Freq: Once | INTRAMUSCULAR | Status: DC | PRN

## 2013-08-02 MED ORDER — LIDOCAINE HCL (PF) 1 % IJ SOLN
30.0000 mL | INTRAMUSCULAR | Status: DC | PRN
  Filled 2013-08-02: qty 30

## 2013-08-02 MED ORDER — SENNOSIDES-DOCUSATE SODIUM 8.6-50 MG PO TABS
2.0000 | ORAL_TABLET | ORAL | Status: DC
  Administered 2013-08-02 – 2013-08-03 (×2): 2 via ORAL
  Filled 2013-08-02 (×2): qty 2

## 2013-08-02 MED ORDER — DIPHENHYDRAMINE HCL 25 MG PO CAPS: 25.0000 mg | ORAL_CAPSULE | Freq: Four times a day (QID) | ORAL | Status: DC | PRN

## 2013-08-02 MED ORDER — DIBUCAINE 1 % RE OINT: 1.0000 "application " | TOPICAL_OINTMENT | RECTAL | Status: DC | PRN

## 2013-08-02 NOTE — H&P (Signed)
Holly Vazquez is a 32 y.o. female G5P4004 at 20w3 presenting for IOL 2/2 di-di twins at term.Pt has had some abdominal cramping but no consistent contraction pattern prior to presentation today. Otherwise denies vaginal bleeding, discharge or gush of fluid. Still with great fetal activity. No recent illnesses. However attests to headaches which started last week, occasionally accompanied by blurred vision. No imbalance, no falls, denies SOB/palps/chest discomfort. Has had nml bps in clinic.  PNC at [facility] since [redacted]weeks gestation. Normal anatomy US (with limited views of twin a heart and twin b face)GBS neg. Nml glucose. Plans for vasectomy for contraception.   Maternal Medical History:  Reason for admission: Nausea.    OB History   Grav Para Term Preterm Abortions TAB SAB Ect Mult Living   5 4 4  0 0 0 0 0 0 4     1. Term, 6lb10oz, SVD 2. Term, 7lb1oz, SVD 3. Term, 6lb12oz, SVD 4. Term, 7lb14oz, SVD 5. Current  Past Medical History  Diagnosis Date  . Restless legs syndrome (RLS)   . Urinary tract infection, site not specified   . Absence of menstruation   . Contact dermatitis and other eczema, due to unspecified cause   . Anxiety   . Depression   . Heart murmur     States always told it was from anemia  . Anemia     due to heavy menses, on iron   Past Surgical History  Procedure Laterality Date  . Wisdom tooth extraction     Family History: family history includes Heart disease in her maternal grandmother; Hypertension in her father and maternal grandmother; Stroke in her maternal grandmother. Social History:  reports that she has never smoked. She has never used smokeless tobacco. She reports that she does not drink alcohol or use illicit drugs.   Prenatal Transfer Tool  Maternal Diabetes: No Genetic Screening: Declined Maternal Ultrasounds/Referrals: Normal Fetal Ultrasounds or other Referrals:  None Maternal Substance Abuse:  No Significant Maternal Medications:  Meds  include: Other: iron Significant Maternal Lab Results:  None, GBS neg Other Comments:  None  Review of Systems  Constitutional: Negative for fever and chills.  HENT: Negative for congestion and sore throat.   Eyes: Positive for blurred vision. Negative for double vision, photophobia, pain and discharge.  Respiratory: Negative for cough, hemoptysis, shortness of breath and wheezing.   Cardiovascular: Positive for leg swelling. Negative for chest pain and palpitations.  Gastrointestinal: Negative for heartburn, nausea, vomiting, abdominal pain, diarrhea and constipation.  Genitourinary: Negative for dysuria, urgency and frequency.  Musculoskeletal: Negative for myalgias.  Skin: Negative for rash.  Neurological: Positive for dizziness and headaches. Negative for sensory change, speech change, seizures, loss of consciousness and weakness.  Endo/Heme/Allergies: Negative for environmental allergies. Does not bruise/bleed easily.  Psychiatric/Behavioral: Negative for depression.    Dilation: 2 Effacement (%): 60 Station: -3 Exam by:: Anabelle Bungert Blood pressure 121/68, pulse 93, temperature 98.8 F (37.1 C), temperature source Oral, resp. rate 20, height 5\' 4"  (1.626 m), weight 81.647 kg (180 lb), last menstrual period 11/06/2012. Exam Physical Exam  Constitutional: She is oriented to person, place, and time. She appears well-developed and well-nourished. No distress.  HENT:  Head: Normocephalic.  Mouth/Throat: No oropharyngeal exudate.  Eyes: EOM are normal.  Neck: Normal range of motion. Neck supple.  Cardiovascular: Normal rate and regular rhythm.   Murmur heard. Respiratory: Effort normal and breath sounds normal. No respiratory distress. She has no wheezes.  GI: Soft. Bowel sounds are normal.  Gravid,  vertex presentation twin A  Genitourinary:  Cervix- 2,60%, -3  Musculoskeletal: Normal range of motion. She exhibits no edema.  Neurological: She is alert and oriented to person, place,  and time.  Skin: Skin is warm and dry. No erythema.  Psychiatric: She has a normal mood and affect. Her behavior is normal.    Prenatal labs: ABO, Rh: B/POS/-- (04/02 1126) Antibody: NEG (04/02 1126) Rubella: 17.30 (04/02 1126) RPR: NON REAC (07/31 0944)  HBsAg: NEGATIVE (04/02 1126)  HIV: NON REACTIVE (07/31 0944)  GBS: Negative (10/02 0000)   FHR-   A 135, mod var, post accel, no decel  B 150, mod var, post accel, no decel  Assessment/Plan: Ms Childers is a 31y.o [id number] who presents at 3823 for IOL 2/2 di-di gestation  1. IOL- twin A vertex presentation, cervix 2,60,-3 (dilation unchanged) -plan to induce with pit, favorable bishop score 6 -routine orders -expect NSVD -HIV neg, GBS neg -pain control with fentanyl and epidural prn  2. Anemia - has baseline anemia chronically, even when not pregnant, taking iron and prenatals, currently 10.4 -will monitor for blood loss during delivery and hemodynamic stability -plt 124  3. Headache- per pt has had on and off for the past week, may be 2/2 dehydration, nml BP in clinic per pt and normal range here -will continue to monitor for signs/sx of pre-e -hold on PIH labs at this time unless pt has elevated BP readings not correlated to pain  4. FHR- reassuring -cat I tracing for both twinA/B  Anselm Lis 08/02/2013, 9:21 AM

## 2013-08-02 NOTE — Anesthesia Procedure Notes (Signed)
Epidural Patient location during procedure: OB Start time: 08/02/2013 11:41 AM  Staffing Anesthesiologist: Angus Seller., Harrell Gave. Performed by: anesthesiologist   Preanesthetic Checklist Completed: patient identified, site marked, surgical consent, pre-op evaluation, timeout performed, IV checked, risks and benefits discussed and monitors and equipment checked  Epidural Patient position: sitting Prep: site prepped and draped and DuraPrep Patient monitoring: continuous pulse ox and blood pressure Approach: midline Injection technique: LOR air and LOR saline  Needle:  Needle type: Tuohy  Needle gauge: 17 G Needle length: 9 cm and 9 Needle insertion depth: 6 cm Catheter type: closed end flexible Catheter size: 19 Gauge Catheter at skin depth: 11 cm Test dose: negative  Assessment Events: blood not aspirated, injection not painful, no injection resistance, negative IV test and no paresthesia  Additional Notes Patient identified.  Risk benefits discussed including failed block, incomplete pain control, headache, nerve damage, paralysis, blood pressure changes, nausea, vomiting, reactions to medication both toxic or allergic, and postpartum back pain.  Patient expressed understanding and wished to proceed.  All questions were answered.  Sterile technique used throughout procedure and epidural site dressed with sterile barrier dressing. No paresthesia or other complications noted.The patient did not experience any signs of intravascular injection such as tinnitus or metallic taste in mouth nor signs of intrathecal spread such as rapid motor block. Please see nursing notes for vital signs.

## 2013-08-02 NOTE — Progress Notes (Signed)
Holly Vazquez is a 32 y.o. G5P4004 at [redacted]w[redacted]d by LMP admitted for IOL 2/2 di-di gestation Subjective: Pt doing well, no complaints s/p epidural for pain control; headache resolved, mild epigastric discomfort (has had throughout pregnancy) no RUQ pain  Objective: BP 142/74  Pulse 83  Temp(Src) 98.4 F (36.9 C) (Oral)  Resp 20  Ht 5\' 4"  (1.626 m)  Wt 81.647 kg (180 lb)  BMI 30.88 kg/m2  SpO2 100%  LMP 11/06/2012      FHT:  TwinAFHR: 135 bpm, variability: moderate,  accelerations:  Present,  decelerations:  Absent TwinB: FHR 140, mod var, +accels, no decel UC:   regular, every 2-4 minutes SVE:   Dilation: 3.5 Effacement (%): 70 Station: -3 Exam by:: Chrysten Woulfe  Labs: Lab Results  Component Value Date   WBC 7.6 08/02/2013   HGB 10.4* 08/02/2013   HCT 32.3* 08/02/2013   MCV 71.0* 08/02/2013   PLT 124* 08/02/2013    Assessment / Plan: Induction of labor due to multifetal gestation,  progressing well on pitocin  Labor: Progressing normally Preeclampsia:  obtaining CMET, given elevated BP and hx of headache prior to presentation Fetal Wellbeing:  Category I X2 Pain Control:  Epidural I/D:  n/a Anticipated MOD:  NSVD  Anselm Lis 08/02/2013, 12:26 PM

## 2013-08-02 NOTE — Progress Notes (Signed)
Holly Vazquez is a 32 y.o. G5P4004 at [redacted]w[redacted]d admitted for induction of labor due to term twin gestation.  Subjective: Comfortable w/ epidural. Denies ha, scotomata, ruq/epigastric pain, n/v.    Objective: BP 123/99  Pulse 99  Temp(Src) 97.4 F (36.3 C) (Oral)  Resp 20  Ht 5\' 4"  (1.626 m)  Wt 81.647 kg (180 lb)  BMI 30.88 kg/m2  SpO2 100%  LMP 11/06/2012      FHT: A FHR: 135 bpm, variability: moderate,  accelerations:  Present,  decelerations:  Absent          B FHR 145, mod variability, acc present, decels absent UC:   regular, every 2-4 minutes SVE:   Dilation: 4 Effacement (%): 80 Station: -2 Exam by:: Yashas Camilli, cnm  Labs: Lab Results  Component Value Date   WBC 7.6 08/02/2013   HGB 10.4* 08/02/2013   HCT 32.3* 08/02/2013   MCV 71.0* 08/02/2013   PLT 124* 08/02/2013    Assessment / Plan: IOL d/t term twin gestation, pitocin at 74mu/min, RN to continue increasing to achieve adequate labor/dilation  Labor: early Preeclampsia:  labs stable Fetal Wellbeing:  Category I x 2 Pain Control:  Epidural I/D:  n/a Anticipated MOD:  NSVD  Marge Duncans 08/02/2013, 2:10 PM

## 2013-08-02 NOTE — H&P (Addendum)
I spoke with and examined patient and agree with resident's note and plan of care.  Pt arrived w/ HA, occ blurred vision. Denies ruq/epigastric pain, has had reflux that has been r/b antacids. Denies n/v. DTRs 2+, no clonus, 2+ BLE edema. HA resolved after apap.  Now comfortable w/ epidural. Few systolic bp's in 098J.  Obtained pre-e labs, LFTs and creatinine wnl, platelets 124. Will continue to observe bp's.  Twin A vtx confirmed via bs u/s by Dr. Shawnie Pons prior to initiation of pitocin.   Cheral Marker, CNM, WHNP-BC 08/02/2013 1330

## 2013-08-02 NOTE — Progress Notes (Signed)
Holly Vazquez is a 32 y.o. G5P4004 at [redacted]w[redacted]d admitted for IOL 2/2 term twin gestation Subjective: Doing well, no complaints, resting quietly in bed, trying to sleep but having difficulty   Objective: BP 114/73  Pulse 119  Temp(Src) 98.7 F (37.1 C) (Oral)  Resp 20  Ht 5\' 4"  (1.626 m)  Wt 81.647 kg (180 lb)  BMI 30.88 kg/m2  SpO2 100%  LMP 11/06/2012      FHT:  AFHR: 140 bpm, variability: moderate,  accelerations:  Present,  decelerations:  Absent; B baseline 140, mod var, post accel, no decels UC:   regular, every 2 minutes SVE:   Dilation: 4.5 Effacement (%): 80 Station: -2 Exam by:: Holly Vazquez  Labs: Lab Results  Component Value Date   WBC 7.6 08/02/2013   HGB 10.4* 08/02/2013   HCT 32.3* 08/02/2013   MCV 71.0* 08/02/2013   PLT 124* 08/02/2013    Assessment / Plan: Induction of labor due to multifetal gestation,  progressing well on pitocin , currently at 60; S/p AROM of twin A  Labor: Progressing normally, early labor Preeclampsia:  labs stable Fetal Wellbeing:  Category I X2 Pain Control:  Epidural I/D:  n/a Anticipated MOD:  NSVD  Anselm Lis 08/02/2013, 5:26 PM

## 2013-08-02 NOTE — Anesthesia Preprocedure Evaluation (Signed)
Anesthesia Evaluation  Patient identified by MRN, date of birth, ID band Patient awake    Reviewed: Allergy & Precautions, H&P , Patient's Chart, lab work & pertinent test results  Airway Mallampati: II TM Distance: >3 FB Neck ROM: full    Dental no notable dental hx.    Pulmonary neg pulmonary ROS,  breath sounds clear to auscultation  Pulmonary exam normal       Cardiovascular negative cardio ROS  + Valvular Problems/Murmurs Rhythm:regular Rate:Normal     Neuro/Psych PSYCHIATRIC DISORDERS Anxiety Depression negative neurological ROS  negative psych ROS   GI/Hepatic negative GI ROS, Neg liver ROS,   Endo/Other  negative endocrine ROS  Renal/GU negative Renal ROS     Musculoskeletal   Abdominal   Peds  Hematology negative hematology ROS (+) anemia ,   Anesthesia Other Findings   Reproductive/Obstetrics (+) Pregnancy                           Anesthesia Physical Anesthesia Plan  ASA: II  Anesthesia Plan: Epidural   Post-op Pain Management:    Induction:   Airway Management Planned:   Additional Equipment:   Intra-op Plan:   Post-operative Plan:   Informed Consent: I have reviewed the patients History and Physical, chart, labs and discussed the procedure including the risks, benefits and alternatives for the proposed anesthesia with the patient or authorized representative who has indicated his/her understanding and acceptance.     Plan Discussed with:   Anesthesia Plan Comments:         Anesthesia Quick Evaluation

## 2013-08-02 NOTE — H&P (Signed)
Chart reviewed and agree with management and plan.  

## 2013-08-03 LAB — CBC
HCT: 30.8 % — ABNORMAL LOW (ref 36.0–46.0)
Hemoglobin: 10.1 g/dL — ABNORMAL LOW (ref 12.0–15.0)
MCH: 23.3 pg — ABNORMAL LOW (ref 26.0–34.0)
MCHC: 32.8 g/dL (ref 30.0–36.0)
MCV: 71.1 fL — ABNORMAL LOW (ref 78.0–100.0)
Platelets: 108 10*3/uL — ABNORMAL LOW (ref 150–400)

## 2013-08-03 NOTE — Lactation Note (Signed)
This note was copied from the chart of BoyA Yulissa Xie. Lactation Consultation Note Initial [facility] LC resources given and discussed with mom.  Mom is experienced with breastfeeding.  Mom holding boy A who is latched shallow on the nipple in cradle hold on left breast.  Encouraged mom to use cross cradle to have more control of latch, explained how baby can breath with deep latch and to keep baby awake for feedings.  Baby readjusted to have wide flanged lips with rhythmic sucking and improved deep latch.  Mom reports less discomfort.  Discussed hand expression with demonstration and colostrum visible.   Patient Name: Holly Vazquez [id number] Date: 08/03/2013 Reason for consult: Initial assessment;Multiple gestation   Maternal Data Has patient been taught Hand Expression?: Yes Does the patient have breastfeeding experience prior to this delivery?: Yes  Feeding Feeding Type: Breast Fed Length of feed: 25 min  LATCH Score/Interventions Latch: Grasps breast easily, tongue down, lips flanged, rhythmical sucking.  Audible Swallowing: A few with stimulation  Type of Nipple: Everted at rest and after stimulation  Comfort (Breast/Nipple): Soft / non-tender     Hold (Positioning): Assistance needed to correctly position infant at breast and maintain latch.  LATCH Score: 8  Lactation Tools Discussed/Used WIC Program: Yes   Consult Status Consult Status: Follow-up Date: 08/04/13 Follow-up type: In-patient    Jannifer Rodney 08/03/2013, 8:44 PM

## 2013-08-03 NOTE — Lactation Note (Signed)
This note was copied from the chart of [name]. Lactation Consultation Note Latch with baby B not observed at this time, mom reports a good 10 minute feeding with improved latch. Encouraged to do skin to skin to improve milk supply and to keep baby awake. Mom to call if needing help or with questions.    Patient Name: Holly Vazquez RUEAV'W Date: 08/03/2013 Reason for consult: Initial assessment;Multiple gestation   Maternal Data Has patient been taught Hand Expression?: Yes Does the patient have breastfeeding experience prior to this delivery?: Yes  Feeding Feeding Type: Breast Fed Length of feed: 10 min  LATCH Score/Interventions                      Lactation Tools Discussed/Used     Consult Status Consult Status: Follow-up Date: 08/04/13 Follow-up type: In-patient    Holly Vazquez 08/03/2013, 8:55 PM

## 2013-08-03 NOTE — Progress Notes (Signed)
Post Partum Day 1, delivered late evening Subjective: Eating, drinking, voiding, ambulating well.  +flatus.  Lochia and pain wnl.  Denies dizziness, lightheadedness, or sob. No complaints.   Objective: Blood pressure 104/68, pulse 84, temperature 98.9 F (37.2 C), temperature source Oral, resp. rate 20, height 5\' 4"  (1.626 m), weight 81.647 kg (180 lb), last menstrual period 11/06/2012, SpO2 100.00%, unknown if currently breastfeeding.  Physical Exam:  General: alert, cooperative and no distress Lochia: appropriate Uterine Fundus: firm Incision: n/a DVT Evaluation: No evidence of DVT seen on physical exam. Negative Homan's sign. No cords or calf tenderness. No significant calf/ankle edema.   Recent Labs  08/02/13 0728  HGB 10.4*  HCT 32.3*    Assessment/Plan: Plan for discharge tomorrow, Breastfeeding and Lactation consult Plans for condoms/vasectomy for contraception, and OP circumcisions   LOS: 1 day   Marge Duncans 08/03/2013, 5:54 AM

## 2013-08-03 NOTE — Progress Notes (Signed)
Clinical Social Work Department PSYCHOSOCIAL ASSESSMENT - MATERNAL/CHILD 08/03/2013  Patient:  Holly Vazquez, Holly Vazquez  Account Number:  [id number]  Admit Date:  08/02/2013  Holly Vazquez Name:   Twin A:  Holly Vazquez  Twin B:  Holly Vazquez    Clinical Social Worker:  Jonia Oakey, LCSW   Date/Time:  08/03/2013 09:10 AM  Date Referred:  08/02/2013   Referral source  Central Nursery     Referred reason  Depression/Anxiety   Other referral source:    I:  FAMILY / HOME ENVIRONMENT Child's legal guardian:  PARENT  Guardian - Name Guardian - Age Guardian - Address  Holly Vazquez,Holly Vazquez 31 [address]  [place], [place] [address]  Holly Vazquez  same as above   Other household support members/support persons Other support:   Church family    II  PSYCHOSOCIAL DATA Information Source:  Patient Interview  Event organiser Employment:   Husband is employed   Surveyor, quantity resources:  [employer] If [employer]:   Clinical biochemist  WIC   School / Grade:   Maternity Care Coordinator / Child Services Coordination / Early Interventions:  Cultural issues impacting care:    III  STRENGTHS  Strength comment:    IV  RISK FACTORS AND CURRENT PROBLEMS Current Problem:       V  SOCIAL WORK ASSESSMENT Acknowledged order for Social Work consult to assess mother's history of depression and anxiety.  Parents are married and have four other dependents ages 109, 63, 10 and 2.  Mother was pleasant and receptive to CSW.    Informed that 2 years ago she and her husband moved from [place] to [place] to help with care of her sick grandmother.  Informed that things did not go well and they encountered many financial stressors.  The job her husband was promised didn't work out and she was unable to work at the time because she was pregnant.  Mother states that this was a very depressing time for her and she did not seek treatment for the depression and the symptoms gradually resolved as  her situation improved.  Informed that she is now well connected with a supportive church and her family is at the top of the list for housing assistance in [place].  She doesn't have a lot of family support.  However, informed that several of the ladies at her church has volunteered to help her and she plans to accept the support.   She denies any use of alcohol of illicit drug use during pregnancy.  She denies currently symptoms of depression.  Discussed signs/symptoms of PP depression with mother and provided her with literature and treatment resources if needed.  No acute social concerns reported or noted at this time.  Parents informed of social work Surveyor, mining.   VI SOCIAL WORK PLAN Social Work Plan  No Further Intervention Required / No Barriers to Discharge   Type of pt/family education:   If child protective services report - county:   If child protective services report - date:   Information/referral to community resources comment:   [facility]   Samiya Mervin J, LCSW

## 2013-08-03 NOTE — Anesthesia Postprocedure Evaluation (Signed)
Anesthesia Post Note  Patient: Holly Vazquez  Procedure(s) Performed: * No procedures listed *  Anesthesia type: Epidural  Patient location: Mother/Baby  Post pain: Pain level controlled  Post assessment: Post-op Vital signs reviewed  Last Vitals:  Filed Vitals:   08/03/13 0554  BP: 108/68  Pulse: 88  Temp: 37.2 C  Resp: 18    Post vital signs: Reviewed  Level of consciousness:alert  Complications: No apparent anesthesia complications Anesthesia Post Note  Patient: Holly Vazquez  Procedure(s) Performed: * No procedures listed *  Anesthesia type: Epidural  Patient location: Mother/Baby  Post pain: Pain level controlled  Post assessment: Post-op Vital signs reviewed  Last Vitals:  Filed Vitals:   08/03/13 0554  BP: 108/68  Pulse: 88  Temp: 37.2 C  Resp: 18    Post vital signs: Reviewed  Level of consciousness:alert  Complications: No apparent anesthesia complications

## 2013-08-04 MED ORDER — IBUPROFEN 600 MG PO TABS: 600.0000 mg | ORAL_TABLET | Freq: Four times a day (QID) | ORAL | Status: DC

## 2013-08-04 NOTE — Discharge Summary (Signed)
Obstetric Discharge Summary Reason for Admission: induction of labor  For di-di twins Prenatal Procedures: NST Intrapartum Procedures: spontaneous vaginal delivery and breech extraction of baby B Postpartum Procedures: none Complications-Operative and Postpartum: none Hemoglobin  Date Value Range Status  08/03/2013 10.1* 12.0 - 15.0 g/dL Final     HCT  Date Value Range Status  08/03/2013 30.8* 36.0 - 46.0 % Final    Physical Exam:  General: alert, cooperative and no distress Lochia: appropriate Uterine Fundus: firm Incision: n/a DVT Evaluation: No evidence of DVT seen on physical exam. No cords or calf tenderness. No significant calf/ankle edema.  Discharge Diagnoses: Term Pregnancy-delivered  Discharge Information: Date: 08/04/2013 Activity: unrestricted Diet: routine Medications: PNV and Ibuprofen Condition: stable Instructions: refer to practice specific booklet Discharge to: home Follow-up Information   Follow up with [facility]. Schedule an appointment as soon as possible for a visit in 4 weeks. (for your postpartum visit)    Specialty:  Obstetrics and Gynecology   Contact information:   [address] [place] [address] [phone]      Newborn Data:   Annabel, Gibeau [[id number]]  Live born female  Birth Weight: 6 lb 7.2 oz (2926 g) APGAR: 9, 9   Diasia, Henken [[id number]]  Live born female  Birth Weight: 6 lb 1.2 oz (2755 g) APGAR: 8, 9  Home with mother.  Pt presented as an IOL for di-di twins.  She progressed well in a good pattern and delivered liveborn twins via NSVD. Baby A vertex and baby B was a breech extraction. Post partum care has been uncomplicated. She is breast feeding and planning on having her husband get a vasectomy.   Melvin Marmo L 08/04/2013, 9:49 AM

## 2013-08-04 NOTE — Progress Notes (Signed)
Post Partum Day 2 Subjective: no complaints, up ad lib, voiding and tolerating PO  Objective: Blood pressure 109/72, pulse 71, temperature 98.6 F (37 C), temperature source Oral, resp. rate 16, height 5\' 4"  (1.626 m), weight 81.647 kg (180 lb), last menstrual period 11/06/2012, SpO2 100.00%, unknown if currently breastfeeding.  Physical Exam:  General: alert, cooperative and no distress Lochia: appropriate Uterine Fundus: firm Incision: na DVT Evaluation: No evidence of DVT seen on physical exam. No cords or calf tenderness. No significant calf/ankle edema.   Recent Labs  08/02/13 0728 08/03/13 0530  HGB 10.4* 10.1*  HCT 32.3* 30.8*    Assessment/Plan: Plan for discharge tomorrow, Breastfeeding and Contraception planning for vasectomy   LOS: 2 days   Holly Vazquez L 08/04/2013, 7:56 AM

## 2013-08-05 ENCOUNTER — Encounter: Payer: Self-pay | Admitting: Advanced Practice Midwife

## 2013-08-07 ENCOUNTER — Encounter: Payer: Self-pay | Admitting: *Deleted

## 2013-09-08 ENCOUNTER — Ambulatory Visit (INDEPENDENT_AMBULATORY_CARE_PROVIDER_SITE_OTHER): Payer: BC Managed Care – PPO | Admitting: Advanced Practice Midwife

## 2013-09-08 ENCOUNTER — Encounter: Payer: Self-pay | Admitting: Advanced Practice Midwife

## 2013-09-08 MED ORDER — IBUPROFEN 600 MG PO TABS: 600.0000 mg | ORAL_TABLET | Freq: Four times a day (QID) | ORAL | Status: DC

## 2013-09-08 NOTE — Progress Notes (Signed)
  Subjective:     Holly Vazquez is a 32 y.o. female who presents for a postpartum visit. She is 5 weeks postpartum following a SVD twins, second delivery breech. I have fully reviewed the prenatal and intrapartum course. The delivery was at 38 gestational weeks. Outcome: spontaneous vaginal delivery. Anesthesia: epidural. Postpartum course has been normal. Baby's course has been normal. Baby is feeding by breast and bottle, weaning from breast at this time. Bleeding staining only. Bowel function is normal. Bladder function is normal. Patient is not sexually active. Contraception method is condoms and husband plans vasectomy. Postpartum depression screening: negative.  The following portions of the patient's history were reviewed and updated as appropriate: allergies, current medications, past family history, past medical history, past social history, past surgical history and problem list.  Review of Systems A comprehensive review of systems was negative.   Objective:    BP 138/88  Pulse 59  Temp(Src) 97.6 F (36.4 C) (Oral)  Ht 5\' 4"  (1.626 m)  Wt 160 lb 1.6 oz (72.621 kg)  BMI 27.47 kg/m2  Breastfeeding? Yes  General:  alert, cooperative and no distress   Breasts:  inspection negative, no nipple discharge or bleeding, no masses or nodularity palpable  Lungs: clear to auscultation bilaterally  Heart:  regular rate and rhythm, S1, S2 normal, no murmur, click, rub or gallop       Pelvic exam deferred Assessment:     Normal postpartum exam.    Plan:    1. Contraception: condoms and vasectomy 2. Discussed options for breast engorgement/discomfort while weaning including ice, cabbage leaves, and ibuprofen.  Ibuprofen 600 mg PO Q6 hours sent to pharmacy.  3. Follow up in: 1 year or as needed.

## 2013-09-10 ENCOUNTER — Other Ambulatory Visit: Payer: Self-pay

## 2013-09-10 MED ORDER — FLUCONAZOLE 200 MG PO TABS: 400.0000 mg | ORAL_TABLET | Freq: Once | ORAL | Status: DC

## 2013-09-10 MED ORDER — FLUCONAZOLE 100 MG PO TABS: 100.0000 mg | ORAL_TABLET | Freq: Two times a day (BID) | ORAL | Status: DC

## 2013-09-10 NOTE — Telephone Encounter (Signed)
Pt came to front desk and stated that her baby has thrush and is currently getting treated and that she was told that she needs to be treated as well. Per Constant with collaboration with of Lactation pt is take Diflucan 400 mg po first day then Diflucan 100 mg po bid for 10 days.  Informed pt that we would submit medication to her pharmacy electronically.  Pt stated understanding.

## 2013-09-20 ENCOUNTER — Encounter (HOSPITAL_COMMUNITY): Payer: Self-pay | Admitting: Emergency Medicine

## 2013-09-20 ENCOUNTER — Emergency Department (HOSPITAL_COMMUNITY)
Admission: EM | Admit: 2013-09-20 | Discharge: 2013-09-20 | Disposition: A | Discharge status: Home / Self Care | Payer: BC Managed Care – PPO | Attending: Emergency Medicine | Admitting: Emergency Medicine

## 2013-09-20 ENCOUNTER — Emergency Department (HOSPITAL_COMMUNITY): Payer: BC Managed Care – PPO

## 2013-09-20 DIAGNOSIS — K8020 Calculus of gallbladder without cholecystitis without obstruction: Secondary | ICD-10-CM

## 2013-09-20 DIAGNOSIS — Z8659 Personal history of other mental and behavioral disorders: Secondary | ICD-10-CM | POA: Insufficient documentation

## 2013-09-20 DIAGNOSIS — R142 Eructation: Secondary | ICD-10-CM | POA: Insufficient documentation

## 2013-09-20 DIAGNOSIS — R141 Gas pain: Secondary | ICD-10-CM | POA: Insufficient documentation

## 2013-09-20 DIAGNOSIS — Z862 Personal history of diseases of the blood and blood-forming organs and certain disorders involving the immune mechanism: Secondary | ICD-10-CM | POA: Insufficient documentation

## 2013-09-20 DIAGNOSIS — R7401 Elevation of levels of liver transaminase levels: Secondary | ICD-10-CM | POA: Insufficient documentation

## 2013-09-20 DIAGNOSIS — R011 Cardiac murmur, unspecified: Secondary | ICD-10-CM | POA: Insufficient documentation

## 2013-09-20 DIAGNOSIS — Z79899 Other long term (current) drug therapy: Secondary | ICD-10-CM | POA: Insufficient documentation

## 2013-09-20 DIAGNOSIS — Z872 Personal history of diseases of the skin and subcutaneous tissue: Secondary | ICD-10-CM | POA: Insufficient documentation

## 2013-09-20 DIAGNOSIS — Z91040 Latex allergy status: Secondary | ICD-10-CM | POA: Insufficient documentation

## 2013-09-20 DIAGNOSIS — R110 Nausea: Secondary | ICD-10-CM | POA: Insufficient documentation

## 2013-09-20 DIAGNOSIS — R7402 Elevation of levels of lactic acid dehydrogenase (LDH): Secondary | ICD-10-CM | POA: Insufficient documentation

## 2013-09-20 LAB — COMPREHENSIVE METABOLIC PANEL
ALT: 195 U/L — ABNORMAL HIGH (ref 0–35)
Albumin: 4.1 g/dL (ref 3.5–5.2)
Alkaline Phosphatase: 117 U/L (ref 39–117)
CO2: 27 mEq/L (ref 19–32)
Calcium: 9.9 mg/dL (ref 8.4–10.5)
GFR calc Af Amer: >90 mL/min (ref >90)
GFR calc non Af Amer: >90 mL/min (ref >90)
Glucose, Bld: 111 mg/dL — ABNORMAL HIGH (ref 70–99)
Potassium: 3.8 mEq/L (ref 3.5–5.1)
Sodium: 137 mEq/L (ref 135–145)
Total Protein: 7.4 g/dL (ref 6.0–8.3)

## 2013-09-20 LAB — CBC WITH DIFFERENTIAL/PLATELET
Basophils Absolute: 0 10*3/uL (ref 0.0–0.1)
Basophils Relative: 0 % (ref 0–1)
Eosinophils Absolute: 0 10*3/uL (ref 0.0–0.7)
Eosinophils Relative: 0 % (ref 0–5)
Lymphs Abs: 1.9 10*3/uL (ref 0.7–4.0)
MCH: 23.2 pg — ABNORMAL LOW (ref 26.0–34.0)
MCHC: 32.2 g/dL (ref 30.0–36.0)
MCV: 72.1 fL — ABNORMAL LOW (ref 78.0–100.0)
Neutro Abs: 9.7 10*3/uL — ABNORMAL HIGH (ref 1.7–7.7)
Neutrophils Relative %: 77 % (ref 43–77)
Platelets: 221 10*3/uL (ref 150–400)
RDW: 13.6 % (ref 11.5–15.5)

## 2013-09-20 MED ORDER — SODIUM CHLORIDE 0.9 % IV SOLN
Freq: Once | INTRAVENOUS | Status: AC
  Administered 2013-09-20: 04:00:00 via INTRAVENOUS

## 2013-09-20 MED ORDER — HYDROCODONE-ACETAMINOPHEN 5-325 MG PO TABS: 1.0000 | ORAL_TABLET | Freq: Four times a day (QID) | ORAL | Status: DC | PRN

## 2013-09-20 MED ORDER — ONDANSETRON HCL 4 MG PO TABS: 4.0000 mg | ORAL_TABLET | Freq: Four times a day (QID) | ORAL | Status: DC

## 2013-09-20 MED ORDER — ONDANSETRON HCL 4 MG/2ML IJ SOLN
4.0000 mg | Freq: Once | INTRAMUSCULAR | Status: AC
  Administered 2013-09-20: 4 mg via INTRAVENOUS
  Filled 2013-09-20: qty 2

## 2013-09-20 MED ORDER — MORPHINE SULFATE 4 MG/ML IJ SOLN
4.0000 mg | Freq: Once | INTRAMUSCULAR | Status: AC
  Administered 2013-09-20: 4 mg via INTRAVENOUS
  Filled 2013-09-20: qty 1

## 2013-09-20 MED ORDER — SODIUM CHLORIDE 0.9 % IV SOLN
INTRAVENOUS | Status: DC
  Administered 2013-09-20: 09:00:00 via INTRAVENOUS

## 2013-09-20 NOTE — ED Notes (Signed)
Bed: WA18 Expected date:  Expected time:  Means of arrival:  Comments: EMS/abd. pain 

## 2013-09-20 NOTE — ED Provider Notes (Signed)
CSN: [id number]     Arrival date & time 09/20/13  0234 History   First MD Initiated Contact with Patient 09/20/13 0254     Chief Complaint  Patient presents with  . Abdominal Pain   (Consider location/radiation/quality/duration/timing/severity/associated sxs/prior Treatment) HPI Comments: Patient is 7, weeks postpartum, vaginal delivery, today noticed, that she's had epigastric and right upper quadrant discomfort after eating meals.  The pain has radiated to her back.  She went to bed feeling marginally better, was awakened several hours later with increased discomfort, flatulence, and "heartburn.  She took Tylenol, and his feeling, better, has no history of gallbladder disease.  She has not been short of, breath.  She's had no leg swelling, .  She is currently nursing  Patient is a 32 y.o. female presenting with abdominal pain. The history is provided by the patient.  Abdominal Pain Pain location:  Epigastric, LUQ and RUQ Pain quality: bloating and pressure   Pain radiates to:  Back Pain severity:  Mild Onset quality:  Gradual Duration:  1 day Timing:  Constant Progression:  Improving Chronicity:  New Context comment:  7 weeks PP Relieved by:  Acetaminophen Worsened by:  Eating Associated symptoms: belching, flatus and nausea   Associated symptoms: no chest pain, no chills, no fever, no shortness of breath and no vomiting   Risk factors comment:  7 weeks post delivery   Past Medical History  Diagnosis Date  . Restless legs syndrome (RLS)   . Urinary tract infection, site not specified   . Absence of menstruation   . Contact dermatitis and other eczema, due to unspecified cause   . Anxiety   . Depression   . Heart murmur     States always told it was from anemia  . Anemia     due to heavy menses, on iron   Past Surgical History  Procedure Laterality Date  . Wisdom tooth extraction     Family History  Problem Relation Age of Onset  . Hypertension Father   . Heart  disease Maternal Grandmother   . Stroke Maternal Grandmother   . Hypertension Maternal Grandmother    History  Substance Use Topics  . Smoking status: Never Smoker   . Smokeless tobacco: Never Used  . Alcohol Use: No   OB History   Grav Para Term Preterm Abortions TAB SAB Ect Mult Living   5 5 5  0 0 0 0 0 1 6     Review of Systems  Constitutional: Negative for fever and chills.  Respiratory: Negative for shortness of breath and wheezing.   Cardiovascular: Negative for chest pain.  Gastrointestinal: Positive for nausea, abdominal pain and flatus. Negative for vomiting.  Musculoskeletal: Positive for back pain.  All other systems reviewed and are negative.    Allergies  Latex and Nickel  Home Medications   Current Outpatient Rx  Name  Route  Sig  Dispense  Refill  . acetaminophen (TYLENOL) 500 MG tablet   Oral   Take 500 mg by mouth every 6 (six) hours as needed (pain).         . fluconazole (DIFLUCAN) 100 MG tablet   Oral   Take 1 tablet (100 mg total) by mouth 2 (two) times daily.   20 tablet   0     100 mg po bid x 10 days after 400mg  po dose the pr ...   . Prenatal Vit-Fe Fumarate-FA (PRENATAL MULTIVITAMIN) TABS tablet   Oral   Take 1 tablet by  mouth daily at 12 noon.         Marland Kitchen HYDROcodone-acetaminophen (NORCO/VICODIN) 5-325 MG per tablet   Oral   Take 1 tablet by mouth every 6 (six) hours as needed for severe pain.   10 tablet   0   . ondansetron (ZOFRAN) 4 MG tablet   Oral   Take 1 tablet (4 mg total) by mouth every 6 (six) hours.   12 tablet   0    BP 137/77  Pulse 65  Temp(Src) 98 F (36.7 C) (Oral)  Resp 18  SpO2 100% Physical Exam  Nursing note and vitals reviewed. Constitutional: She appears well-developed and well-nourished.  HENT:  Head: Normocephalic.  Eyes: Pupils are equal, round, and reactive to light.  Neck: Normal range of motion.  Cardiovascular: Normal rate and regular rhythm.   Pulmonary/Chest: Effort normal and breath  sounds normal. No respiratory distress. She exhibits no tenderness.  Abdominal: Soft. Bowel sounds are normal. She exhibits no distension. There is tenderness in the right upper quadrant and epigastric area. There is no rigidity, no rebound and no guarding.  Musculoskeletal: Normal range of motion.  Neurological: She is alert.  Skin: Skin is warm and dry.    ED Course  Procedures (including critical care time) Labs Review Labs Reviewed  CBC WITH DIFFERENTIAL - Abnormal; Notable for the following:    WBC 12.5 (*)    RBC 5.17 (*)    MCV 72.1 (*)    MCH 23.2 (*)    Neutro Abs 9.7 (*)    All other components within normal limits  COMPREHENSIVE METABOLIC PANEL - Abnormal; Notable for the following:    Glucose, Bld 111 (*)    AST 363 (*)    ALT 195 (*)    All other components within normal limits  LIPASE, BLOOD - Abnormal; Notable for the following:    Lipase 73 (*)    All other components within normal limits   Imaging Review US Abdomen Complete  09/20/2013   CLINICAL DATA:  Abdominal pain.  EXAM: ULTRASOUND ABDOMEN COMPLETE  COMPARISON:  None.  FINDINGS: Gallbladder:  Numerous small stones and sludge are present in the gallbladder. Gallbladder wall is not thickened. Negative sonographic Murphy's sign.  Common bile duct:  Diameter: 3.8 mm, normal.  Liver:  2.2 cm echogenic lesion in the posterior aspect of the right lobe of the liver consistent with a benign hemangioma. Otherwise normal.  IVC:  Normal.  Pancreas:  Normal.  Spleen:  Normal.  6.9 cm in length.  Right Kidney:  Length: 11.9 cm. Echogenicity within normal limits. No mass or hydronephrosis visualized.  Left Kidney:  Length: 12.4 cm. Echogenicity within normal limits. No mass or hydronephrosis visualized.  Abdominal aorta:  No aneurysm visualized.  1.8 cm maximum diameter.  Other findings:  None.  IMPRESSION: 1. Numerous tiny gallstones. 2. Small echogenic lesion in the posterior aspect of the right lobe of the liver, most likely a  benign hemangioma.   Electronically Signed   By: Geanie Cooley M.D.   On: 09/20/2013 07:19    EKG Interpretation   None       MDM   1. Cholelithiasis   2. Transaminitis     Patient.  Does not appear septic or toxic in nature.  Pain is decreasing, but there is still concern for gallbladder disease.  We'll obtain labs, and reassess    Arman Filter, NP 09/20/13 1956

## 2013-09-20 NOTE — ED Provider Notes (Signed)
Presents with RUQ tenderness after eating pizza.  Awaiting abd [place] to r/o biliary disease.    On exam patient appears to be nontoxic, resting comfortably in bed. Heart with S1-S2 no murmurs, rubs, or gallops. Lungs clear to auscultation bilaterally. Abdomen is soft with right upper quadrant tenderness, negative Murphy's, no guarding, no rebound, no McBurney's point, no rash, no hernia. No CVA tenderness.   Currently await results of ultrasound, suspect biliary disease.   7:28 AM Patient with elevated WBC of 12.5, transaminitis with AST 363, ALT 195 alkaline phosphatase 117, and a lipase of 73. Ultrasound of abdomen shows numerous tiny gallstones the gallbladder is not thickened.  The patient is currently endorse a 6/10 abdominal pain. I discussed the finding with patient, patient prefers to be seen by a surgeon for further management. Will consult [facility].  7:58 AM I have consulted with [facility] Dr. Daphine Deutscher, who will see pt after he makes his rounds.  Felt pain likely from biliary colic from a recent passed stone.    9:12 AM Surgeon Dr. Daphine Deutscher has evaluate pt and felt she is stable to be discharge to f/u outpt for elective cholecystectomy.  Recommend prescribe pain medication and antinausea medication.  I also encourage pt to eat bland food.  Will give referral, return precaution discussed.  Pt is currently breast feeding, recommend pump/dump when taking her meds.  Pt agrees with plan.    I have reviewed nursing notes and vital signs. I personally reviewed the imaging tests through PACS system  I reviewed available ER/hospitalization records thought the EMR  Results for orders placed during the hospital encounter of 09/20/13  CBC WITH DIFFERENTIAL      Result Value Range   WBC 12.5 (*) 4.0 - 10.5 K/uL   RBC 5.17 (*) 3.87 - 5.11 MIL/uL   Hemoglobin 12.0  12.0 - 15.0 g/dL   HCT 98.1  19.1 - 47.8 %   MCV 72.1 (*) 78.0 - 100.0 fL   MCH 23.2 (*) 26.0 - 34.0 pg   MCHC 32.2  30.0 - 36.0 g/dL   RDW 29.5   62.1 - 30.8 %   Platelets 221  150 - 400 K/uL   Neutrophils Relative % 77  43 - 77 %   Neutro Abs 9.7 (*) 1.7 - 7.7 K/uL   Lymphocytes Relative 15  12 - 46 %   Lymphs Abs 1.9  0.7 - 4.0 K/uL   Monocytes Relative 7  3 - 12 %   Monocytes Absolute 0.9  0.1 - 1.0 K/uL   Eosinophils Relative 0  0 - 5 %   Eosinophils Absolute 0.0  0.0 - 0.7 K/uL   Basophils Relative 0  0 - 1 %   Basophils Absolute 0.0  0.0 - 0.1 K/uL  COMPREHENSIVE METABOLIC PANEL      Result Value Range   Sodium 137  135 - 145 mEq/L   Potassium 3.8  3.5 - 5.1 mEq/L   Chloride 101  96 - 112 mEq/L   CO2 27  19 - 32 mEq/L   Glucose, Bld 111 (*) 70 - 99 mg/dL   BUN 10  6 - 23 mg/dL   Creatinine, Ser 6.57  0.50 - 1.10 mg/dL   Calcium 9.9  8.4 - 84.6 mg/dL   Total Protein 7.4  6.0 - 8.3 g/dL   Albumin 4.1  3.5 - 5.2 g/dL   AST 962 (*) 0 - 37 U/L   ALT 195 (*) 0 - 35 U/L   Alkaline  Phosphatase 117  39 - 117 U/L   Total Bilirubin 0.4  0.3 - 1.2 mg/dL   GFR calc non Af Amer >90  >90 mL/min   GFR calc Af Amer >90  >90 mL/min  LIPASE, BLOOD      Result Value Range   Lipase 73 (*) 11 - 59 U/L   US Abdomen Complete  09/20/2013   CLINICAL DATA:  Abdominal pain.  EXAM: ULTRASOUND ABDOMEN COMPLETE  COMPARISON:  None.  FINDINGS: Gallbladder:  Numerous small stones and sludge are present in the gallbladder. Gallbladder wall is not thickened. Negative sonographic Murphy's sign.  Common bile duct:  Diameter: 3.8 mm, normal.  Liver:  2.2 cm echogenic lesion in the posterior aspect of the right lobe of the liver consistent with a benign hemangioma. Otherwise normal.  IVC:  Normal.  Pancreas:  Normal.  Spleen:  Normal.  6.9 cm in length.  Right Kidney:  Length: 11.9 cm. Echogenicity within normal limits. No mass or hydronephrosis visualized.  Left Kidney:  Length: 12.4 cm. Echogenicity within normal limits. No mass or hydronephrosis visualized.  Abdominal aorta:  No aneurysm visualized.  1.8 cm maximum diameter.  Other findings:  None.   IMPRESSION: 1. Numerous tiny gallstones. 2. Small echogenic lesion in the posterior aspect of the right lobe of the liver, most likely a benign hemangioma.   Electronically Signed   By: Geanie Cooley M.D.   On: 09/20/2013 07:19    Medications  0.9 %  sodium chloride infusion (not administered)  0.9 %  sodium chloride infusion ( Intravenous Stopped 09/20/13 0806)  ondansetron (ZOFRAN) injection 4 mg (4 mg Intravenous Given 09/20/13 0415)  morphine 4 MG/ML injection 4 mg (4 mg Intravenous Given 09/20/13 0736)     Fayrene Helper, PA-C 09/20/13 1610

## 2013-09-20 NOTE — ED Notes (Signed)
Pt c/o upper abd pain that began this pm; pt denies N/V/D; pt denies constipation; pt recently gave birth to twins 7 weeks ago

## 2013-09-22 NOTE — ED Provider Notes (Signed)
Medical screening examination/treatment/procedure(s) were performed by non-physician practitioner and as supervising physician I was immediately available for consultation/collaboration.   Emari Demmer, MD 09/22/13 0041 

## 2013-09-22 NOTE — ED Provider Notes (Signed)
Medical screening examination/treatment/procedure(s) were performed by non-physician practitioner and as supervising physician I was immediately available for consultation/collaboration.   Loren Racer, MD 09/22/13 [phone]

## 2013-10-22 IMAGING — US US OB EACH ADDL GEST<[ID]
1 series · 13 of 28 positions shown · non-contrast
Comparison: none

[Series 1: us ob comp less 14 wks · 13 of 30 slices shown]
[im 2/30]
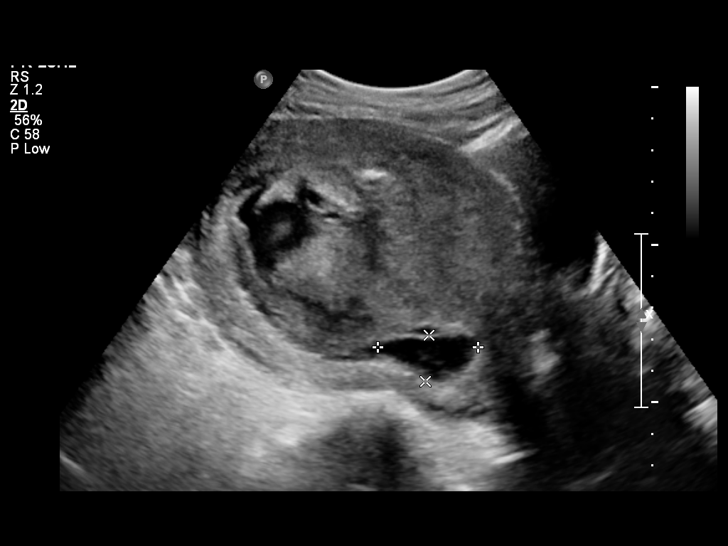
[im 4/30]
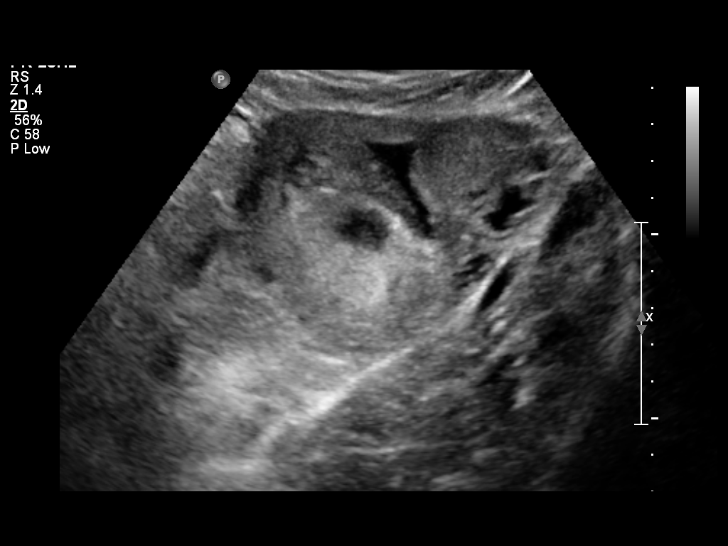
[im 6/30]
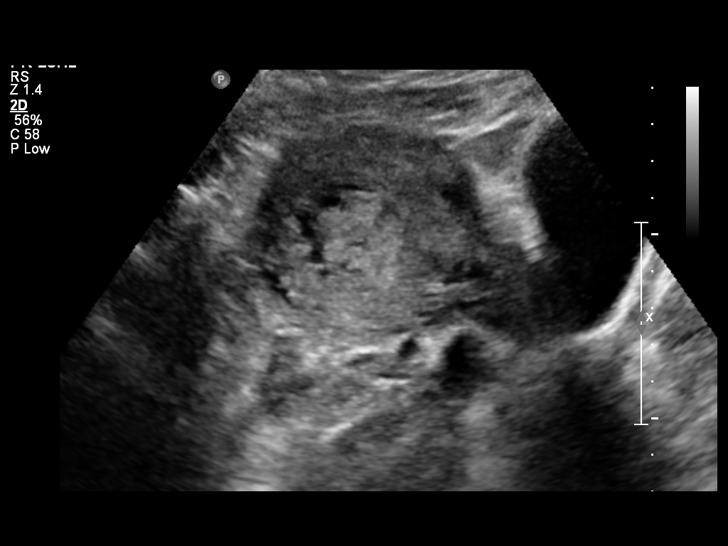
[im 8/30]
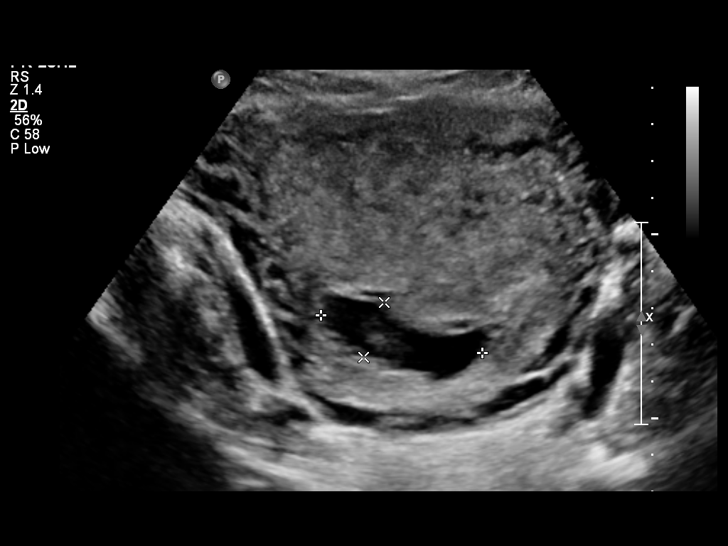
[im 10/30]
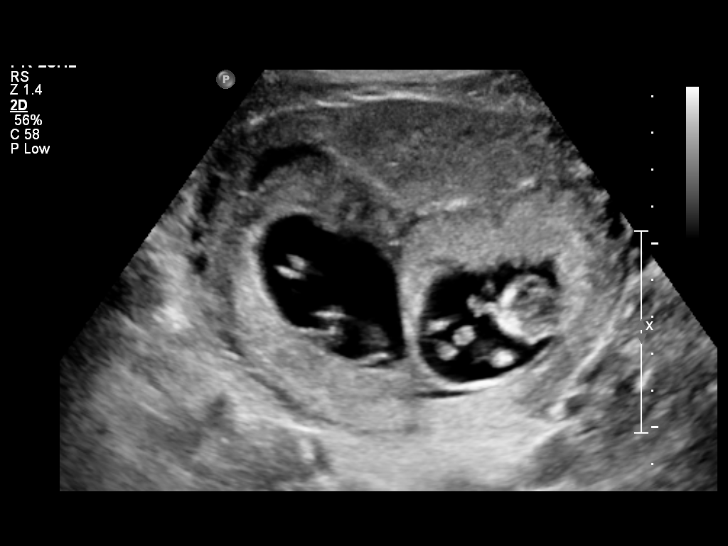
[im 12/30]
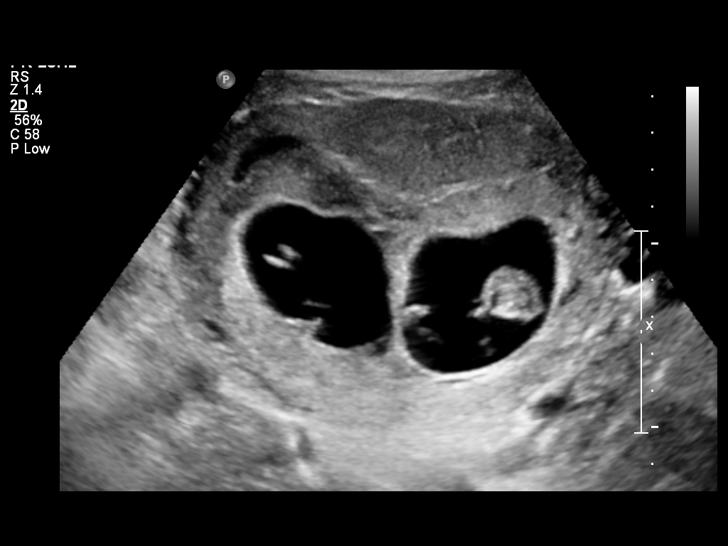
[im 16/30]
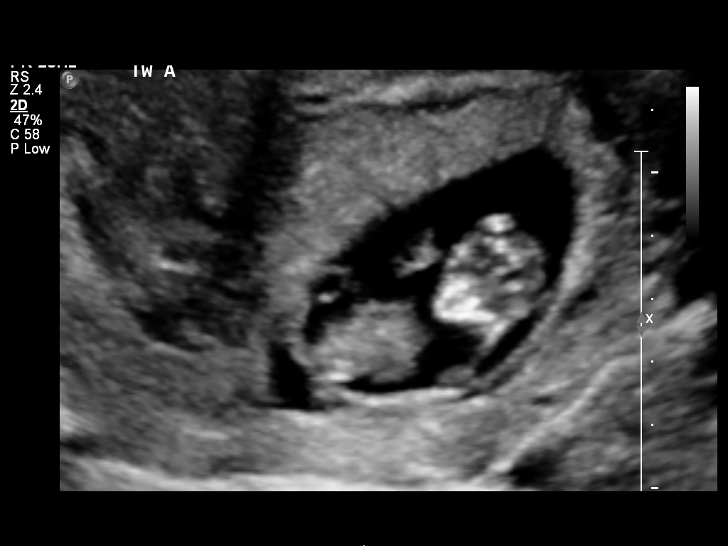
[im 18/30]
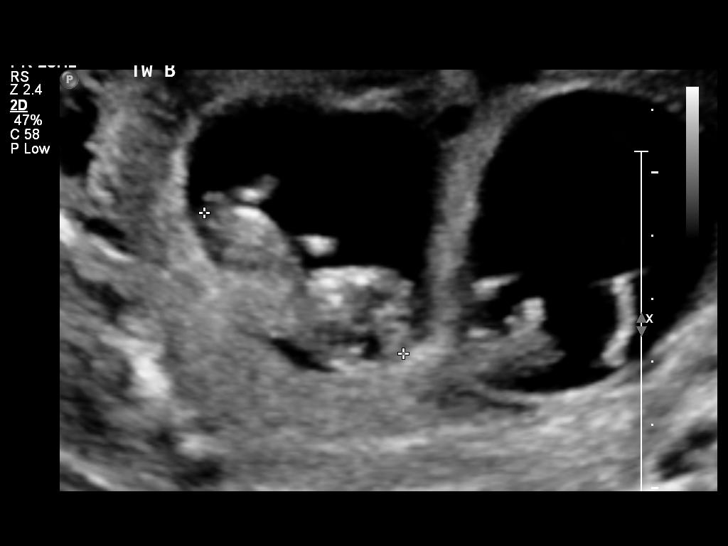
[im 20/30]
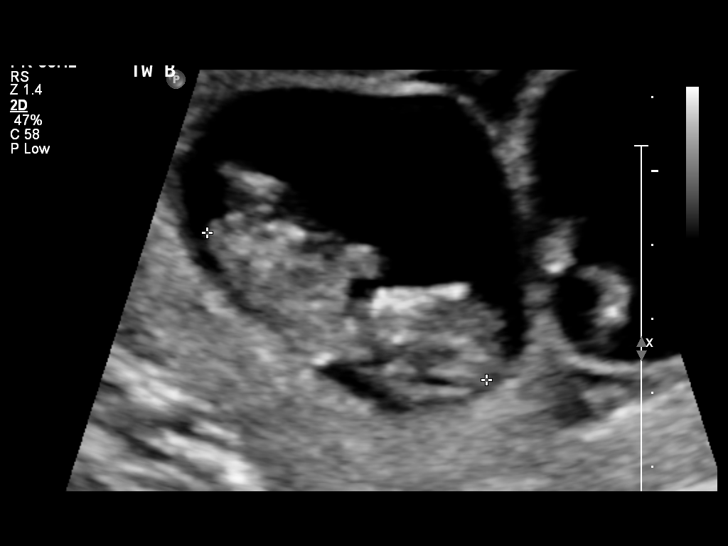
[im 22/30]
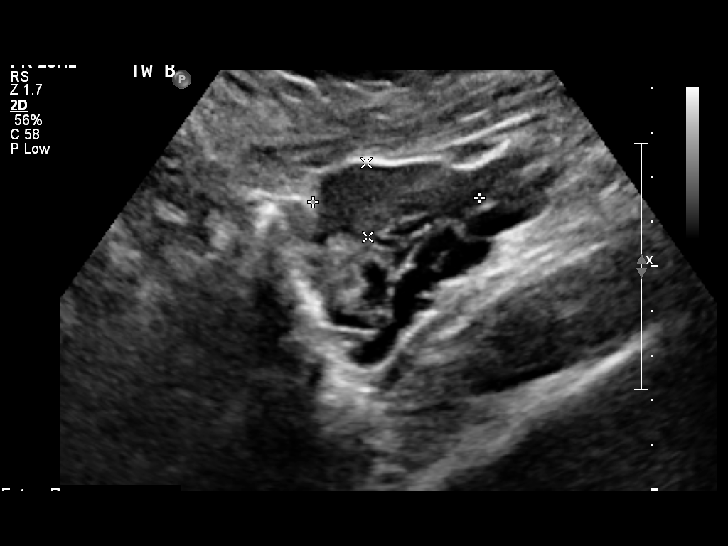
[im 24/30]
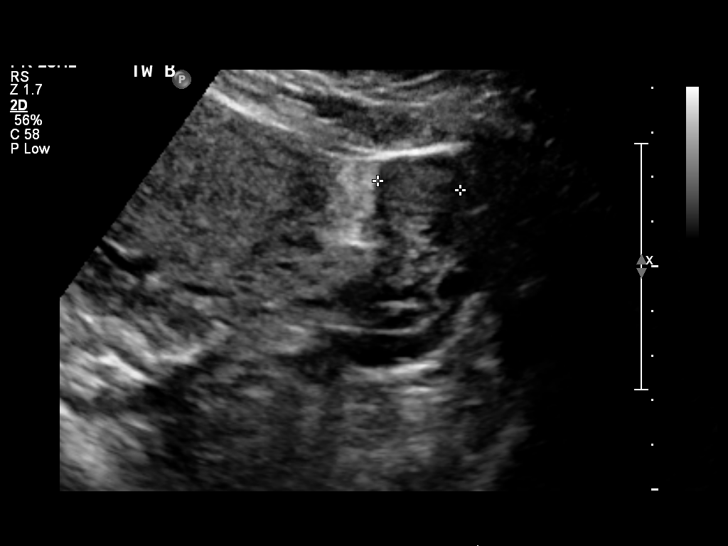
[im 26/30]
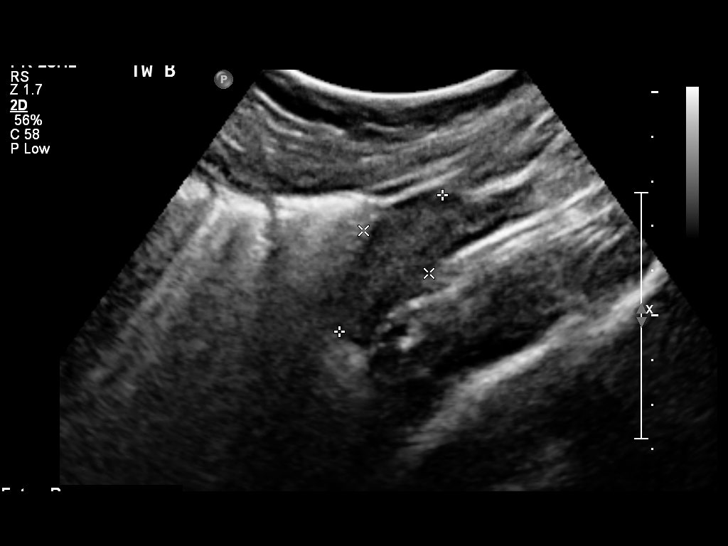
[im 28/30]
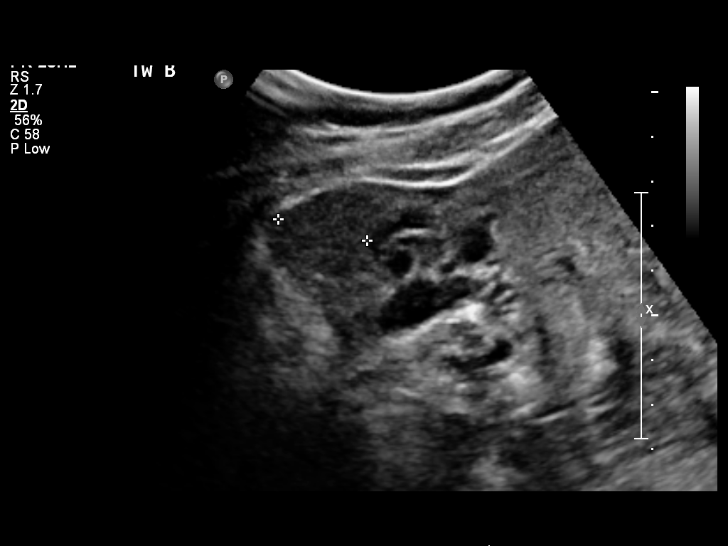

[13 of 28 positions shown; findings below may reference images not displayed]

OBSTETRICS REPORT
                      (Signed Final 01/25/2013 [DATE])

Service(s) Provided

 US OB COMP LESS 14 WKS                                76801.0
 US OB COMP ADDL GEST LESS 14 WKS                      76802.0
Indications

 Vaginal bleeding, unknown etiology
Fetal Evaluation (Fetus A)

 Num Of Fetuses:    2
 Gest. Sac:         Intrauterine
 Yolk Sac:          Visualized
 Fetal Pole:        Visualized
 Fetal Heart Rate:  172                         bpm
 Cardiac Activity:  Observed
 Fetal Lie:         Lower Fetus
 Presentation:      Variable

 Comment:    Small subchorionic hemorrhage noted at internal os and
             in upper utrs bilaterally.

 Amniotic Fluid
 AFI FV:      Subjectively within normal limits
Biometry (Fetus A)

 CRL:     41.7  mm    G. Age:   10w 6d                 EDD:   08/17/13
Gestational Age (Fetus A)

 LMP:           11w 4d       Date:   11/05/12                 EDD:   08/12/13
 Best:          10w 6d    Det. By:   C R L Avg. (01/22/13)    EDD:   08/17/13

Fetal Evaluation (Fetus B)

 Num Of Fetuses:    2
 Fetal Heart Rate:  155                         bpm
 Cardiac Activity:  Observed
 Fetal Lie:         Upper Fetus
 Presentation:      Variable
 Amniotic Fluid
 AFI FV:      Subjectively within normal limits
Biometry (Fetus B)

 CRL:     40.1  mm    G. Age:   10w 5d                 EDD:   08/18/13
Gestational Age (Fetus B)

 LMP:           11w 4d       Date:   11/05/12                 EDD:   08/12/13
 Best:          10w 6d    Det. By:   C R L Avg. (01/22/13)    EDD:   08/17/13
Cervix Uterus Adnexa

 Cervix:       Normal appearance by transabdominal scan.

 Left Ovary:   Size(cm) L: 3.71 x W: 1.85 x H: 1.65  Volume(cc):
 Right Ovary:  Size(cm) L: 3.82 x W: 2.04 x H: 1.74  Volume(cc):
 Adnexa:     No abnormality visualized.
Impression

 Living di-di twin IUP, with assigned GA currently 10w 6d.
 Appropriate and concordant twin fetal growth.
 Increased small subchorionic hemorrhage c/w prior exam.
 No adnexal mass or free fluid identified.

 questions or concerns.

## 2013-10-31 ENCOUNTER — Encounter: Payer: Self-pay | Admitting: Obstetrics & Gynecology

## 2013-10-31 ENCOUNTER — Encounter: Payer: BC Managed Care – PPO | Admitting: Advanced Practice Midwife

## 2013-10-31 DIAGNOSIS — N390 Urinary tract infection, site not specified: Secondary | ICD-10-CM

## 2013-10-31 LAB — POCT URINALYSIS DIP (DEVICE)
GLUCOSE, UA: 100 mg/dL — AB
Nitrite: POSITIVE — AB
Protein, ur: 100 mg/dL — AB
Specific Gravity, Urine: <=1.005 (ref 1.005–1.030)
UROBILINOGEN UA: 4 mg/dL — AB (ref 0.0–1.0)
pH: 5 (ref 5.0–8.0)

## 2013-11-05 ENCOUNTER — Telehealth: Payer: Self-pay | Admitting: General Practice

## 2013-11-05 DIAGNOSIS — B379 Candidiasis, unspecified: Secondary | ICD-10-CM

## 2013-11-05 MED ORDER — SULFAMETHOXAZOLE-TMP DS 800-160 MG PO TABS: 1.0000 | ORAL_TABLET | Freq: Two times a day (BID) | ORAL | Status: DC

## 2013-11-05 MED ORDER — FLUCONAZOLE 100 MG PO TABS: 100.0000 mg | ORAL_TABLET | Freq: Two times a day (BID) | ORAL | Status: DC

## 2013-11-05 NOTE — Telephone Encounter (Signed)
Spoke with patient and informed of antibiotic for UTI at pharmacy and pt request yeast infection medication, states she has itchy discharge.

## 2013-11-05 NOTE — Telephone Encounter (Signed)
Patient called and left message stating she is returning our missed phone call.

## 2013-11-19 ENCOUNTER — Telehealth: Payer: Self-pay

## 2013-11-19 MED ORDER — FLUCONAZOLE 150 MG PO TABS: 150.0000 mg | ORAL_TABLET | Freq: Once | ORAL | Status: DC

## 2013-11-19 NOTE — Telephone Encounter (Signed)
Pt called and stated that an Rx was sent in for her but wanted to know if she could get the one pill cause the other is $20. Called pt and pt informed me that she has taken the antibiotic for her UTI but when she went to go pick up the Rx for her yeast infection they trying to give her 20 pills and she stated that she is used to only getting one pill.  I informed pt that we will prescribed her one pill does of diflucan and to please give her [place] a couple of hours before going to go pick it up.  Pt stated understanding.

## 2013-12-08 NOTE — Progress Notes (Signed)
  This encounter was created in error - please disregard. Error

## 2014-04-12 IMAGING — US US OB FOLLOW-UP
1 series · 15 of 28 positions shown · non-contrast
Comparison: none

[Series 1: us ob follow-up · 0.25mm/px · 15 of 44 slices shown]
[im 1/44]
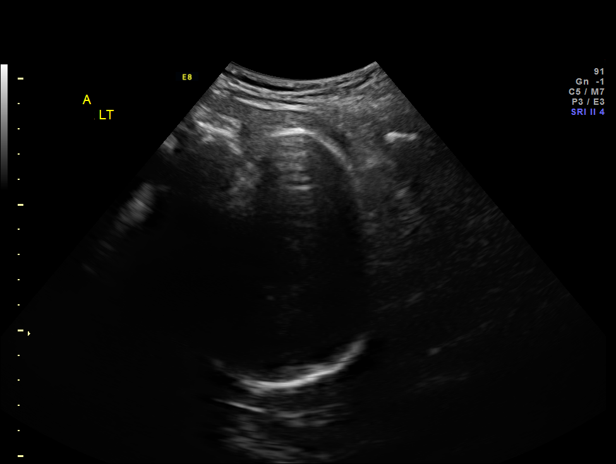
[im 4/44]
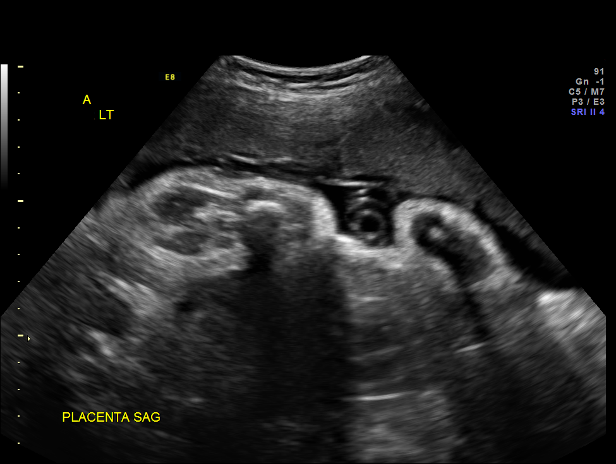
[im 7/44]
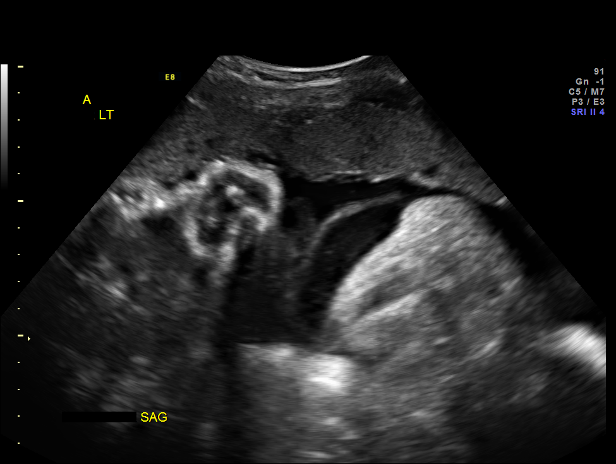
[im 10/44]
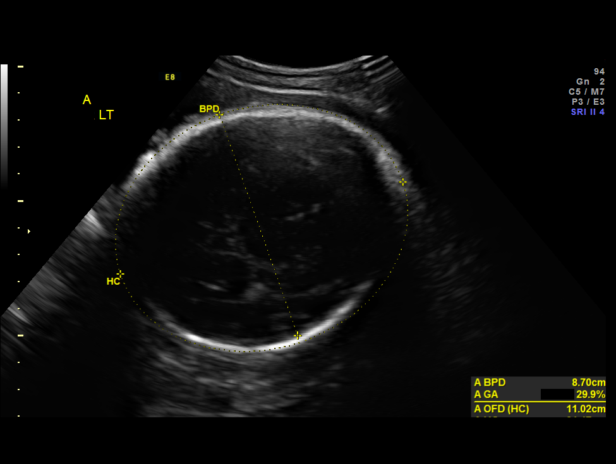
[im 13/44]
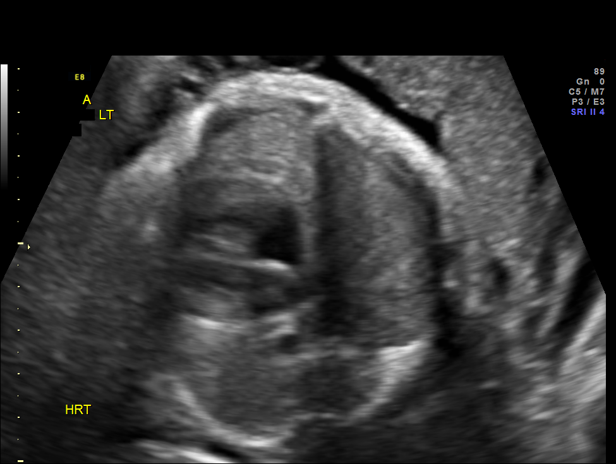
[im 16/44]
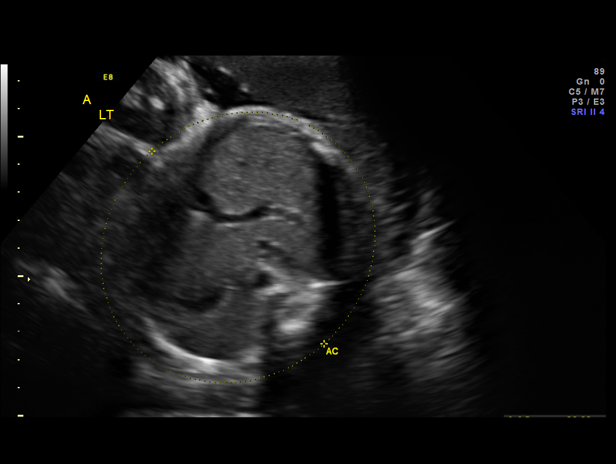
[im 20/44]
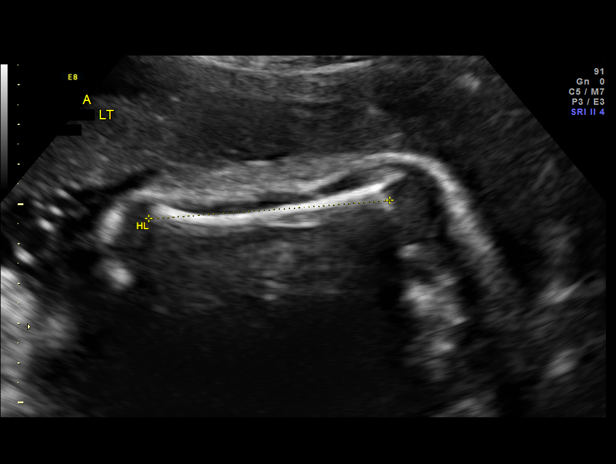
[im 23/44]
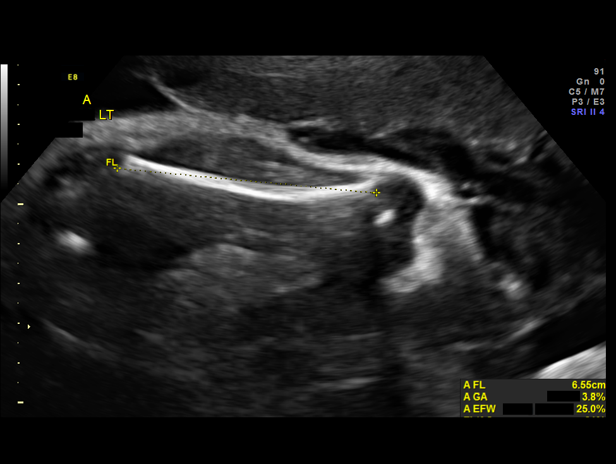
[im 24/44]
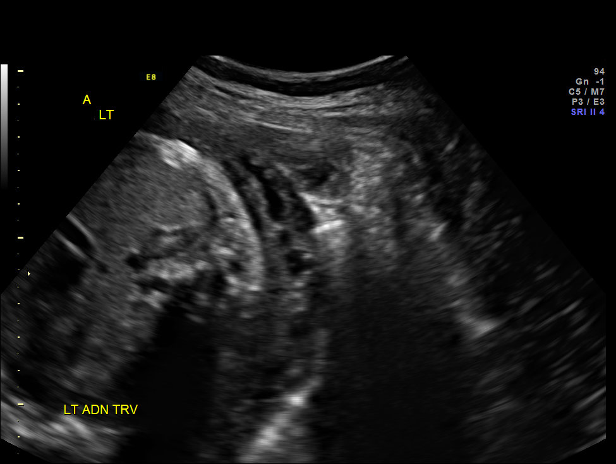
[im 28/44]
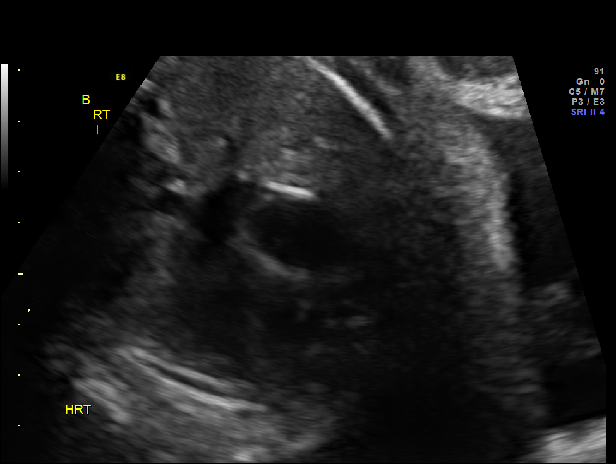
[im 31/44]
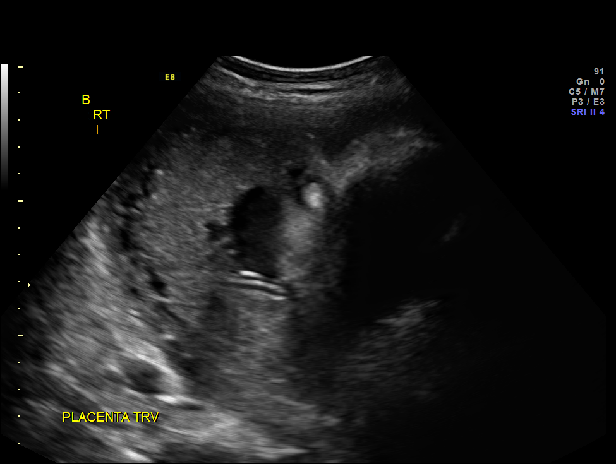
[im 34/44]
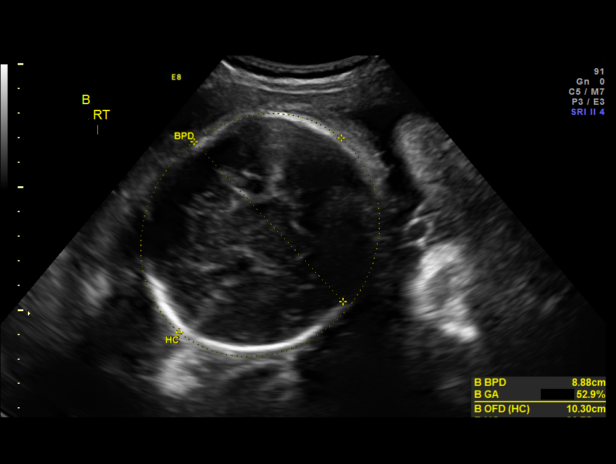
[im 37/44]
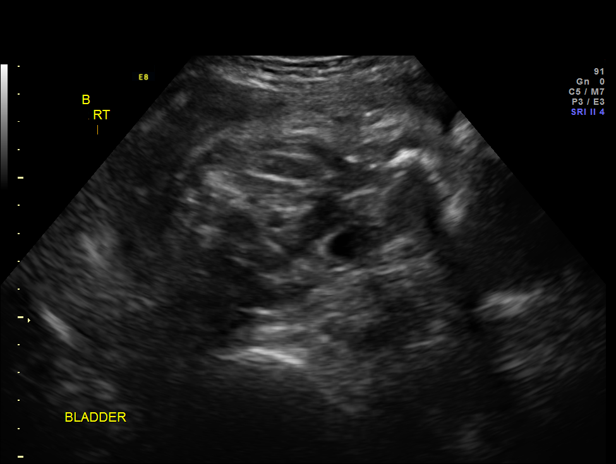
[im 40/44]
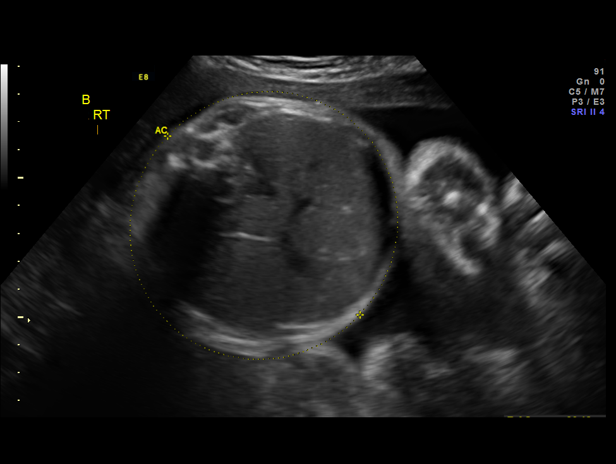
[im 44/44]
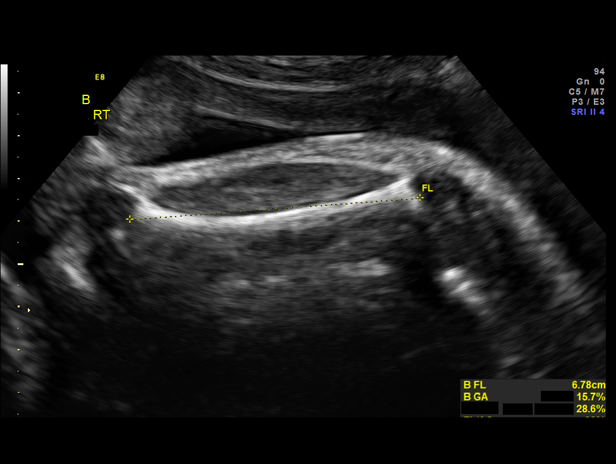

[15 of 28 positions shown; findings below may reference images not displayed]

OBSTETRICS REPORT
                      (Signed Final 07/16/2013 [DATE])

Service(s) Provided

 US OB FOLLOW UP                                       76816.1
 US OB FOLLOW UP ADDL GEST                             76816.2
Indications

 Twin gestation, Di-Di
Fetal Evaluation (Fetus A)

 Num Of Fetuses:    2
 Fetal Heart Rate:  141                          bpm
 Cardiac Activity:  Observed
 Fetal Lie:         Lower left Fetus
 Presentation:      Cephalic
 Placenta:          Anterior, above cervical os
 P. Cord            Previously Visualized
 Insertion:

 Membrane Desc:     Dividing
                    Membrane seen
                    - Dichorionic.

 Amniotic Fluid
 AFI FV:      Subjectively within normal limits
                                             Larg Pckt:     5.3  cm
Biometry (Fetus A)

 BPD:     87.4  mm     G. Age:  35w 2d                CI:         80.2   70 - 86
 OFD:      109  mm                                    FL/HC:      21.0   20.1 -

 HC:     313.5  mm     G. Age:  35w 1d        7  %    HC/AC:      1.03   0.93 -

 AC:     305.6  mm     G. Age:  34w 4d       18  %    FL/BPD:     75.2   71 - 87
 FL:      65.7  mm     G. Age:  33w 6d        5  %    FL/AC:      21.5   20 - 24
 HUM:     59.4  mm     G. Age:  34w 3d       34  %

 Est. FW:    2380  gm      5 lb 6 oz     22  %     FW Discordancy         0  %
Gestational Age (Fetus A)

 LMP:           36w 1d        Date:  11/05/12                 EDD:   08/12/13
 U/S Today:     34w 5d                                        EDD:   08/22/13
 Best:          36w 1d     Det. By:  LMP  (11/05/12)          EDD:   08/12/13
Anatomy (Fetus A)

 Cranium:          Appears normal         Aortic Arch:      Previously seen
 Fetal Cavum:      Previously seen        Ductal Arch:      Previously seen
 Ventricles:       Appears normal         Diaphragm:        Previously seen
 Choroid Plexus:   Previously seen        Stomach:          Appears normal, left
                                                            sided
 Cerebellum:       Previously seen        Abdomen:          Previously seen
 Posterior Fossa:  Previously seen        Abdominal Wall:   Previously seen
 Nuchal Fold:      Previously seen        Cord Vessels:     Previously seen
 Face:             Orbits and profile     Kidneys:          Appear normal
                   previously seen
 Lips:             Previously seen        Bladder:          Appears normal
 Heart:            Appears normal         Spine:            Previously seen
                   (4CH, axis, and
                   situs)
 RVOT:             Previously seen        Lower             Previously seen
                                          Extremities:
 LVOT:             Previously seen        Upper             Previously seen
                                          Extremities:

 Other:  Fetus appears to be a male. Heels and 5th digit previously seen.
         Technically difficult due to advanced GA and fetal position.

Fetal Evaluation (Fetus B)

 Num Of Fetuses:    2
 Fetal Heart Rate:  152                          bpm
 Cardiac Activity:  Observed
 Fetal Lie:         Upper right Fetus
 Presentation:      Breech
 Placenta:          Posterior, above cervical
                    os
 P. Cord            Previously Visualized
 Insertion:

 Membrane Desc:     Dividing
                    Membrane seen
                    - Dichorionic.

 Amniotic Fluid
 AFI FV:      Subjectively within normal limits
                                             Larg Pckt:     6.5  cm
Biometry (Fetus B)

 BPD:     88.9  mm     G. Age:  36w 0d                CI:         84.7   70 - 86
 OFD:    104.9  mm                                    FL/HC:      21.6   20.1 -

 HC:     311.1  mm     G. Age:  34w 6d        4  %    HC/AC:      1.03   0.93 -

 AC:     302.3  mm     G. Age:  34w 2d       12  %    FL/BPD:     75.5   71 - 87
 FL:      67.1  mm     G. Age:  34w 4d       11  %    FL/AC:      22.2   20 - 24
 HUM:     58.2  mm     G. Age:  33w 5d       23  %

 Est. FW:    4008  gm      5 lb 6 oz     23  %     FW Discordancy      0 \ 0 %
Gestational Age (Fetus B)
 LMP:           36w 1d        Date:  11/05/12                 EDD:   08/12/13
 U/S Today:     35w 0d                                        EDD:   08/20/13
 Best:          36w 1d     Det. By:  LMP  (11/05/12)          EDD:   08/12/13
Anatomy (Fetus B)

 Cranium:          Appears normal         Aortic Arch:      Previously seen
 Fetal Cavum:      Previously seen        Ductal Arch:      Previously seen
 Ventricles:       Previously seen        Diaphragm:        Previously seen
 Choroid Plexus:   Previously seen        Stomach:          Appears normal, left
                                                            sided
 Cerebellum:       Previously seen        Abdomen:          Previously seen
 Posterior Fossa:  Previously seen        Abdominal Wall:   Previously seen
 Nuchal Fold:      Previously seen        Cord Vessels:     Previously seen
 Face:             Orbits and profile     Kidneys:          Appear normal
                   previously seen
 Lips:             Previously seen        Bladder:          Appears normal
 Heart:            Appears normal         Spine:            Previously seen
                   (4CH, axis, and
                   situs)
 RVOT:             Previously seen        Lower             Previously seen
                                          Extremities:
 LVOT:             Previously seen        Upper             Previously seen
                                          Extremities:

 Other:  Fetus appears to be a male. Heels and 5th digit previously seen.
         Technically difficult due to advanced GA and fetal position.
Cervix Uterus Adnexa

 Cervix:       Not visualized (advanced GA >65wks)

 Adnexa:     No abnormality visualized.
Impression

 Dichorionic/ Diamniotic twin gestation with best dates of 36
 [DATE] weeks
 Interval growth is concordant

 Twin A:
 maternal left, cephalic, anterior placenta
 The estimated fetal weight today is at the 22nd %tile.
 Normal amniotic fluid volume

 Twin B:
 Breech, maternal right, posterior placenta
 Fetal growth is appropriate (23rd %tile)
 Normal amniotic fluid volume
Recommendations

 Follow-up ultrasounds as clinically indicated.
 Consider delivery at 38 weeks if undelivered by that time.

 questions or concerns.

## 2014-04-27 ENCOUNTER — Encounter (HOSPITAL_COMMUNITY): Payer: Self-pay | Admitting: Emergency Medicine

## 2014-04-27 ENCOUNTER — Emergency Department (HOSPITAL_COMMUNITY): Payer: BC Managed Care – PPO

## 2014-04-27 ENCOUNTER — Emergency Department (HOSPITAL_COMMUNITY)
Admission: EM | Admit: 2014-04-27 | Discharge: 2014-04-27 | Disposition: A | Discharge status: Home / Self Care | Payer: BC Managed Care – PPO | Attending: Emergency Medicine | Admitting: Emergency Medicine

## 2014-04-27 DIAGNOSIS — N939 Abnormal uterine and vaginal bleeding, unspecified: Secondary | ICD-10-CM | POA: Insufficient documentation

## 2014-04-27 DIAGNOSIS — L259 Unspecified contact dermatitis, unspecified cause: Secondary | ICD-10-CM | POA: Insufficient documentation

## 2014-04-27 DIAGNOSIS — F411 Generalized anxiety disorder: Secondary | ICD-10-CM | POA: Insufficient documentation

## 2014-04-27 DIAGNOSIS — N926 Irregular menstruation, unspecified: Secondary | ICD-10-CM | POA: Insufficient documentation

## 2014-04-27 DIAGNOSIS — R1011 Right upper quadrant pain: Secondary | ICD-10-CM

## 2014-04-27 DIAGNOSIS — Z8719 Personal history of other diseases of the digestive system: Secondary | ICD-10-CM

## 2014-04-27 DIAGNOSIS — Z79899 Other long term (current) drug therapy: Secondary | ICD-10-CM | POA: Insufficient documentation

## 2014-04-27 DIAGNOSIS — F329 Major depressive disorder, single episode, unspecified: Secondary | ICD-10-CM | POA: Diagnosis not present

## 2014-04-27 DIAGNOSIS — R011 Cardiac murmur, unspecified: Secondary | ICD-10-CM | POA: Diagnosis not present

## 2014-04-27 DIAGNOSIS — F3289 Other specified depressive episodes: Secondary | ICD-10-CM | POA: Diagnosis not present

## 2014-04-27 DIAGNOSIS — Z87440 Personal history of urinary (tract) infections: Secondary | ICD-10-CM | POA: Insufficient documentation

## 2014-04-27 DIAGNOSIS — R109 Unspecified abdominal pain: Secondary | ICD-10-CM | POA: Diagnosis present

## 2014-04-27 DIAGNOSIS — R7989 Other specified abnormal findings of blood chemistry: Secondary | ICD-10-CM | POA: Diagnosis not present

## 2014-04-27 DIAGNOSIS — R945 Abnormal results of liver function studies: Secondary | ICD-10-CM

## 2014-04-27 DIAGNOSIS — Z91040 Latex allergy status: Secondary | ICD-10-CM | POA: Diagnosis not present

## 2014-04-27 DIAGNOSIS — G2581 Restless legs syndrome: Secondary | ICD-10-CM | POA: Insufficient documentation

## 2014-04-27 DIAGNOSIS — D649 Anemia, unspecified: Secondary | ICD-10-CM | POA: Insufficient documentation

## 2014-04-27 LAB — COMPREHENSIVE METABOLIC PANEL
ALT: 251 U/L — ABNORMAL HIGH (ref 0–35)
ANION GAP: 12 (ref 5–15)
AST: 442 U/L — ABNORMAL HIGH (ref 0–37)
Albumin: 3.9 g/dL (ref 3.5–5.2)
Alkaline Phosphatase: 80 U/L (ref 39–117)
BUN: 19 mg/dL (ref 6–23)
CALCIUM: 9 mg/dL (ref 8.4–10.5)
CO2: 23 mEq/L (ref 19–32)
CREATININE: 0.73 mg/dL (ref 0.50–1.10)
Chloride: 103 mEq/L (ref 96–112)
GLUCOSE: 113 mg/dL — AB (ref 70–99)
Potassium: 4.1 mEq/L (ref 3.7–5.3)
SODIUM: 138 meq/L (ref 137–147)
TOTAL PROTEIN: 7.6 g/dL (ref 6.0–8.3)
Total Bilirubin: 0.2 mg/dL — ABNORMAL LOW (ref 0.3–1.2)

## 2014-04-27 LAB — URINALYSIS, ROUTINE W REFLEX MICROSCOPIC
BILIRUBIN URINE: NEGATIVE
Glucose, UA: NEGATIVE mg/dL
Hgb urine dipstick: NEGATIVE
KETONES UR: NEGATIVE mg/dL
LEUKOCYTES UA: NEGATIVE
Nitrite: NEGATIVE
PROTEIN: NEGATIVE mg/dL
Specific Gravity, Urine: 1.015 (ref 1.005–1.030)
Urobilinogen, UA: 0.2 mg/dL (ref 0.0–1.0)
pH: 6 (ref 5.0–8.0)

## 2014-04-27 LAB — PREGNANCY, URINE: PREG TEST UR: NEGATIVE

## 2014-04-27 LAB — CBC WITH DIFFERENTIAL/PLATELET
Basophils Absolute: 0 10*3/uL (ref 0.0–0.1)
Basophils Relative: 0 % (ref 0–1)
EOS ABS: 0 10*3/uL (ref 0.0–0.7)
Eosinophils Relative: 0 % (ref 0–5)
HCT: 34.8 % — ABNORMAL LOW (ref 36.0–46.0)
Hemoglobin: 11 g/dL — ABNORMAL LOW (ref 12.0–15.0)
Lymphocytes Relative: 9 % — ABNORMAL LOW (ref 12–46)
Lymphs Abs: 1.1 10*3/uL (ref 0.7–4.0)
MCH: 22.7 pg — AB (ref 26.0–34.0)
MCHC: 31.6 g/dL (ref 30.0–36.0)
MCV: 71.9 fL — ABNORMAL LOW (ref 78.0–100.0)
Monocytes Absolute: 0.8 10*3/uL (ref 0.1–1.0)
Monocytes Relative: 7 % (ref 3–12)
NEUTROS ABS: 10.2 10*3/uL — AB (ref 1.7–7.7)
NEUTROS PCT: 84 % — AB (ref 43–77)
Platelets: 289 10*3/uL (ref 150–400)
RBC: 4.84 MIL/uL (ref 3.87–5.11)
RDW: 13.2 % (ref 11.5–15.5)
WBC: 12.1 10*3/uL — AB (ref 4.0–10.5)

## 2014-04-27 LAB — LIPASE, BLOOD: Lipase: 73 U/L — ABNORMAL HIGH (ref 11–59)

## 2014-04-27 MED ORDER — HYDROCODONE-ACETAMINOPHEN 5-325 MG PO TABS: 1.0000 | ORAL_TABLET | ORAL | Status: DC | PRN

## 2014-04-27 NOTE — Discharge Instructions (Signed)
Call for a follow up appointment with a Family or Primary Care Provider, for further evaluation of your elevated liver enzymes. And repeat testing. Call a General Surgeon for further evaluation of your abdominal pain and history of gall stones. Return if Symptoms worsen.   Take medication as prescribed.  Avoid foods that worsen your abdominal pain.   Emergency Department Resource Guide 1) Find a Doctor and Pay Out of Pocket Although you won't have to find out who is covered by your insurance plan, it is a good idea to ask around and get recommendations. You will then need to call the office and see if the doctor you have chosen will accept you as a new patient and what types of options they offer for patients who are self-pay. Some doctors offer discounts or will set up payment plans for their patients who do not have insurance, but you will need to ask so you aren't surprised when you get to your appointment.  2) Contact Your Local Health Department Not all health departments have doctors that can see patients for sick visits, but many do, so it is worth a call to see if yours does. If you don't know where your local health department is, you can check in your phone book. The CDC also has a tool to help you locate your state's health department, and many state websites also have listings of all of their local health departments.  3) Find a [facility] If your illness is not likely to be very severe or complicated, you may want to try a walk in clinic. These are popping up all over the country in pharmacies, drugstores, and shopping centers. They're usually staffed by nurse practitioners or physician assistants that have been trained to treat common illnesses and complaints. They're usually fairly quick and inexpensive. However, if you have serious medical issues or chronic medical problems, these are probably not your best option.  No Primary Care Doctor: - Call Health Connect at  [phone] -  they can help you locate a primary care doctor that  accepts your insurance, provides certain services, etc. - Physician Referral Service- [phone]  Chronic Pain Problems: Organization         Address  Phone   Notes  [facility]  [phone] Patients need to be referred by their primary care doctor.   Medication Assistance: Organization         Address  Phone   Notes  [facility] Medication [facility] [address] [place], [place] [address] [phone] --Must be a resident of [facility] -- Must have NO insurance coverage whatsoever (no Medicaid/ Medicare, etc.) -- The pt. MUST have a primary care doctor that directs their care regularly and follows them in the community   Med[facility]ist  [phone]   [employer]  [phone]    Agencies that provide inexpensive medical care: Organization         Address  Phone   Notes  [place]  [phone]   [facility]    [phone]   [facility] [place],  [address] [phone]   [place] [address], [place] [phone]   Planned Parenthood    [phone]   [facility]    [phone]   [place] and [place] [address], [place] Phone:  [phone], Fax:  [phone] Hours of Operation:  9 am - 6 pm, M-F.  Also accepts Medicaid/Medicare and self-pay.  [facility] for [place], [address], [place] Phone: [phone], Fax: [phone]. Hours of Operation:  8:30 am - 5:30 pm, M-F.  Also accepts Medicaid and self-pay.  [facility] [place] [address], [place] Phone: [phone]   [place], [place], [place] [phone], Ext. [phone] Mondays & Thursdays: 7-9 AM.  First 15 patients are seen on a first come, first serve basis.    [place] Providers:  Organization         Address  Phone   Notes  [facility] [address], [place] [phone] Also accepts self-pay patients.  [facility] [address], [place]  [phone]   [place], [address], [place] [phone]   [facility] [address] [place] [phone]   Lucianne Lei [address], [place]   [phone] Only accepts [place] Access [place] patients after they have their name applied to their card.   Self-Pay (no insurance) in [facility]:  Organization         Address  Phone   Notes  Sickle Cell Patients, [facility] Internal Medicine [place] [phone]   [facility] [place] [phone]   [facility] [place]  [place], [place], [place] [phone]   [facility]/Dr. Osei-Bonsu  [address], [place] [address], [place] [phone] Phone number for both [place] and [place] locations is the same.  [facility] [address] [place] [phone]   [facility] [address] [place] or [address] [phone]   [facility] [address], [place] [phone], phone; [phone], fax Sees patients 1st and 3rd Saturday of every month.  Must not qualify for public or private insurance (i.e. Medicaid, Medicare, [place] Health Choice, Veterans' Benefits)  Household income should be no more than 200% of the poverty level The clinic cannot treat you if you are pregnant or think you are pregnant  Sexually transmitted diseases are not treated at the clinic.    Dental Care: Organization         Address  Phone  Notes  [facility] Department of [facility] [place] [phone] Accepts children up to age 51 who are enrolled in [place] or [place]; pregnant women with a Medicaid card; and children who have applied for Medicaid or [place] Health Choice, but were declined, whose parents can pay a reduced fee at time of service.  [facility] Department of [facility]  [address], [place] [phone] Accepts children up to age 57 who are enrolled in [place] or [place]; pregnant women with a Medicaid card; and children who have applied for Medicaid or [place] Health Choice, but were declined, whose parents can pay a reduced fee at time of service.  [place] Adult Dental Access PROGRAM  [place] [phone] Patients are seen by appointment only. Walk-ins are not accepted. [place]  Dental will see patients 74 years of age and older. Monday - Tuesday (8am-5pm) Most Wednesdays (8:30-5pm) $30 per visit, cash only  [facility] Adult Dental Access PROGRAM  [address], [facility] [phone] Patients are seen by appointment only. Walk-ins are not accepted. [place] will see patients 52 years of age and older. One Wednesday Evening (Monthly: Volunteer Based).  $30 per visit, cash only  [place]  [phone] for adults; Children under age 91, call Graduate Pediatric Dentistry at [phone]. Children aged 57-14, please call [phone] to request a pediatric application.  Dental services are provided in all areas of dental care including fillings, crowns and bridges, complete and partial dentures, implants, gum treatment, root canals, and extractions. Preventive care is also provided. Treatment is provided to both adults and children. Patients are selected via a lottery and there is often a waiting list.   [facility] [address] [place]  [phone] [email or web address]   Rescue  [facility] [address] [place], [place] [phone], Ext. [phone] Second and Fourth Thursday of each month, opens at 6:30 AM; Clinic ends at 9 AM.  Patients are seen on a first-come first-served basis, and a limited number are seen during each clinic.   [facility]  [address] [place], [place] [phone]   Eligibility Requirements You must have lived in [place], [place], or [place] counties for at least the last three months.   You cannot be eligible for state or federal sponsored [employer], including [employer], [place], or [employer].   You generally cannot be eligible for healthcare insurance through your employer.    How to apply: Eligibility screenings are held every Tuesday and Wednesday afternoon from 1:00 pm until 4:00 pm. You do not need an appointment for the interview!  [facility] [address] [place], [place]   [place]  [place] Department  [place]  [phone]    Behavioral Health Resources in the Community: Intensive Outpatient Programs Organization         Address  Phone  Notes  [place]. [address] [place], [place] [phone]   [facility] Outpatient [address], [place], [place]   ADS: Alcohol & Drug Svcs [address], [place], [place]   [place] [address]  [place], [place] or [phone]   Substance Abuse Resources Organization         Address  Phone  Notes  Alcohol and Drug Services  [phone]   [place]  [phone]   [facility] [place]   Chinita Pester  [phone]   Residential & Outpatient Substance Abuse Program  [phone]   Psychological Services Organization         Address  Phone  Notes  [facility] [place]   [place]  [phone]   [place] [address], [place] or [phone]    Mobile Crisis Teams Organization         Address  Phone  Notes  [facility], Mobile Crisis Care Unit  [phone]   [facility]ive Psychotherapeutic Services  [address] [place], [place]   [facility] [address] [place] [phone]    Self-Help/Support Groups Organization  Address  Phone             Notes  [place]. of [place] - variety of support groups  [place] Call for more information  Narcotics Anonymous (NA), [facility] [address], [employer] [place]  2 meetings at this location   Special educational needs teacher         Address  Phone  Notes  [facility] [place],    [place]  [phone]   [facility]  [address], [place] [phone], [place], [place]   [place], [place] [phone] Admissions: 8am-3pm M-F  Incentives Substance [place] [address],    [place], [place] [phone]   [facility] [address] [place], [place], [place]   [facility] [address],  [place], [place]   [facility] Programs - Intensive Outpatient [address], [place], [place]   [facility] ([place] [place].,  [place], [place] [phone] or [phone]   [facility] Services (RTS) [address] [place], [place] Accepts Medicaid  [facility] [place] [address]  [place] [phone] Substance Abuse/Addiction Treatment   [facility] Organization         Address  Phone  Notes  [facility]  [phone]   Domenic Schwab, PhD [address] [place], [place]   [phone] or  [phone]   [place], [place] [phone]   [facility] [address], [place], [place] [phone] Insurance/Medicaid/sponsorship through [facility] and Families [address], [address]                                    [place], [place] [phone] [place] [address], [place] [phone]    Dr. Adele Schilder  [phone]   [facility] [place] Dept. 1) [address], [place] 2) [place] 3)  [place] [address], [place] [phone]  [phone]   [place] [phone] or [phone] (After Hours)

## 2014-04-27 NOTE — ED Notes (Signed)
Pt ambulated to the BR with steady gait.   

## 2014-04-27 NOTE — ED Notes (Signed)
Pt reports RUQ pain which started around 0200.  Pt denies any n/v/d at this time.  Denies use of etoh at this time.

## 2014-04-27 NOTE — ED Notes (Signed)
Pt reports her husband is coming to pick her up

## 2014-04-27 NOTE — ED Provider Notes (Signed)
CSN: [id number]     Arrival date & time 04/27/14  0320 History   First MD Initiated Contact with Patient 04/27/14 [phone]     Chief Complaint  Patient presents with  . Abdominal Pain     (Consider location/radiation/quality/duration/timing/severity/associated sxs/prior Treatment) HPI Comments: The patient is a 33 year old female with history of gallstones, presents emergency room chief complaint of abrupt onset of right upper quadrant discomfort at 0200 today. The patient reports sharp pain, nonradiating. She reports taking 2 Tylenol, with moderate resolution of symptoms. She reports similar symptoms in the past, has been controlled by diet. She reports eating chips with dip last night.  Denies fever, chills, nausea, vomiting, constipation, diarrhea, back pain, urinary symptoms. Patient's last menstrual period was 04/13/2014.   Patient is a 33 y.o. female presenting with abdominal pain. The history is provided by the patient. No language interpreter was used.  Abdominal Pain Associated symptoms: no chills, no constipation, no diarrhea, no fever, no nausea and no vomiting     Past Medical History  Diagnosis Date  . Restless legs syndrome (RLS)   . Urinary tract infection, site not specified   . Absence of menstruation   . Contact dermatitis and other eczema, due to unspecified cause   . Anxiety   . Depression   . Heart murmur     States always told it was from anemia  . Anemia     due to heavy menses, on iron   Past Surgical History  Procedure Laterality Date  . Wisdom tooth extraction     Family History  Problem Relation Age of Onset  . Hypertension Father   . Heart disease Maternal Grandmother   . Stroke Maternal Grandmother   . Hypertension Maternal Grandmother    History  Substance Use Topics  . Smoking status: Never Smoker   . Smokeless tobacco: Never Used  . Alcohol Use: No   OB History   Grav Para Term Preterm Abortions TAB SAB Ect Mult Living   5 5 5  0 0 0 0 0 1  6     Review of Systems  Constitutional: Negative for fever and chills.  Gastrointestinal: Positive for abdominal pain. Negative for nausea, vomiting, diarrhea and constipation.  Musculoskeletal: Negative for back pain.      Allergies  Latex and Nickel  Home Medications   Prior to Admission medications   Medication Sig Start Date End Date Taking? Authorizing Provider  Multiple Vitamin (MULTIVITAMIN WITH MINERALS) TABS tablet Take 1 tablet by mouth daily.   Yes Historical Provider, MD   BP 131/75  Pulse 74  Temp(Src) 97.6 F (36.4 C) (Oral)  Resp 18  Ht 5\' 4"  (1.626 m)  Wt 148 lb (67.132 kg)  BMI 25.39 kg/m2  SpO2 100%  LMP 04/13/2014 Physical Exam  Nursing note and vitals reviewed. Constitutional: She is oriented to person, place, and time. She appears well-developed and well-nourished.  Non-toxic appearance. She does not have a sickly appearance. She does not appear ill. No distress.  HENT:  Head: Normocephalic and atraumatic.  Eyes: Conjunctivae and EOM are normal. Pupils are equal, round, and reactive to light.  Neck: Normal range of motion. Neck supple.  Cardiovascular: Normal rate and regular rhythm.   Pulmonary/Chest: Effort normal and breath sounds normal. No respiratory distress. She has no wheezes. She has no rales.  Abdominal: Soft. Bowel sounds are normal. She exhibits no distension. There is tenderness in the right upper quadrant and epigastric area. There is no rebound, no guarding  and negative Murphy's sign.  Musculoskeletal: Normal range of motion.  Neurological: She is alert and oriented to person, place, and time.  Skin: Skin is warm and dry. She is not diaphoretic.  Psychiatric: She has a normal mood and affect.    ED Course  Procedures (including critical care time) Labs Review Results for orders placed during the hospital encounter of 04/27/14  CBC WITH DIFFERENTIAL      Result Value Ref Range   WBC 12.1 (*) 4.0 - 10.5 K/uL   RBC 4.84  3.87 -  5.11 MIL/uL   Hemoglobin 11.0 (*) 12.0 - 15.0 g/dL   HCT 34.8 (*) 36.0 - 46.0 %   MCV 71.9 (*) 78.0 - 100.0 fL   MCH 22.7 (*) 26.0 - 34.0 pg   MCHC 31.6  30.0 - 36.0 g/dL   RDW 13.2  11.5 - 15.5 %   Platelets 289  150 - 400 K/uL   Neutrophils Relative % 84 (*) 43 - 77 %   Lymphocytes Relative 9 (*) 12 - 46 %   Monocytes Relative 7  3 - 12 %   Eosinophils Relative 0  0 - 5 %   Basophils Relative 0  0 - 1 %   Neutro Abs 10.2 (*) 1.7 - 7.7 K/uL   Lymphs Abs 1.1  0.7 - 4.0 K/uL   Monocytes Absolute 0.8  0.1 - 1.0 K/uL   Eosinophils Absolute 0.0  0.0 - 0.7 K/uL   Basophils Absolute 0.0  0.0 - 0.1 K/uL   Smear Review MORPHOLOGY UNREMARKABLE    COMPREHENSIVE METABOLIC PANEL      Result Value Ref Range   Sodium 138  137 - 147 mEq/L   Potassium 4.1  3.7 - 5.3 mEq/L   Chloride 103  96 - 112 mEq/L   CO2 23  19 - 32 mEq/L   Glucose, Bld 113 (*) 70 - 99 mg/dL   BUN 19  6 - 23 mg/dL   Creatinine, Ser 0.73  0.50 - 1.10 mg/dL   Calcium 9.0  8.4 - 10.5 mg/dL   Total Protein 7.6  6.0 - 8.3 g/dL   Albumin 3.9  3.5 - 5.2 g/dL   AST 442 (*) 0 - 37 U/L   ALT 251 (*) 0 - 35 U/L   Alkaline Phosphatase 80  39 - 117 U/L   Total Bilirubin 0.2 (*) 0.3 - 1.2 mg/dL   GFR calc non Af Amer >90  >90 mL/min   GFR calc Af Amer >90  >90 mL/min   Anion gap 12  5 - 15  LIPASE, BLOOD      Result Value Ref Range   Lipase 73 (*) 11 - 59 U/L  PREGNANCY, URINE      Result Value Ref Range   Preg Test, Ur NEGATIVE  NEGATIVE  URINALYSIS, ROUTINE W REFLEX MICROSCOPIC      Result Value Ref Range   Color, Urine YELLOW  YELLOW   APPearance CLEAR  CLEAR   Specific Gravity, Urine 1.015  1.005 - 1.030   pH 6.0  5.0 - 8.0   Glucose, UA NEGATIVE  NEGATIVE mg/dL   Hgb urine dipstick NEGATIVE  NEGATIVE   Bilirubin Urine NEGATIVE  NEGATIVE   Ketones, ur NEGATIVE  NEGATIVE mg/dL   Protein, ur NEGATIVE  NEGATIVE mg/dL   Urobilinogen, UA 0.2  0.0 - 1.0 mg/dL   Nitrite NEGATIVE  NEGATIVE   Leukocytes, UA NEGATIVE   NEGATIVE   US Abdomen Complete  04/27/2014  CLINICAL DATA:  Right upper quadrant pain.  EXAM: ULTRASOUND ABDOMEN COMPLETE  COMPARISON:  Abdominal ultrasound September 20, 2013  FINDINGS: Gallbladder:  Multiples small echogenic layering gallstones with acoustic shadowing again seen. No gallbladder wall thickening or pericholecystic fluid. No sonographic Murphy's sign elicited.  Common bile duct:  Diameter: 4 mm  Liver:  18 x 19 x 17 mm echogenic focus in the right lobe of the liver likely reflects hemangioma, liver is otherwise unremarkable. No perihepatic free fluid. Hepatopetal portal vein.  IVC:  No abnormality visualized.  Pancreas:  Visualized portion unremarkable.  Spleen:  Size and appearance within normal limits.  Right Kidney:  Length: 12 cm. Echogenicity within normal limits. No mass or hydronephrosis visualized.  Left Kidney:  Length: 11.4 cm. Echogenicity within normal limits. No mass or hydronephrosis visualized.  Abdominal aorta:  No aneurysm visualized.  Other findings:  None.  IMPRESSION: Cholelithiasis without sonographic findings of acute cholecystitis.  Right hepatic hemangioma.   Electronically Signed   By: Elon Alas   On: 04/27/2014 05:55    EKG Interpretation None      MDM   Final diagnoses:  Right upper quadrant pain  History of cholelithiasis   Patient with known gallstones per EMR presents with right upper quadrant pain. Labs order, ultrasound to rule out gallbladder infection. Reports symptom improvement after home tylenol, declnes medication. [place] negative for acute cholecystitis.  LFTs elevated, similar to previous levels. Plan to follow up with GI and general surgery for known cholelithiasis. Discussed lab results, imaging results, and treatment plan with the patient. Return precautions given. Reports understanding and no other concerns at this time.  Patient is stable for discharge at this time.  Meds given in ED:  Medications - No data to display  New  Prescriptions   HYDROCODONE-ACETAMINOPHEN (NORCO/VICODIN) 5-325 MG PER TABLET    Take 1 tablet by mouth every 4 (four) hours as needed for moderate pain or severe pain.       Lorrine Kin, PA-C 04/28/14 2137

## 2014-04-27 NOTE — ED Notes (Signed)
MD at bedside. Margarita Grizzle EDPA

## 2014-04-27 NOTE — ED Notes (Signed)
Pt presented by EMS from c/o of RUQ pain, tender and guarding bilateral. Denies n/v/d, reports hx of gall bladder problem, took sour cream which she suspects might caused the problem.

## 2014-04-27 NOTE — ED Notes (Signed)
Bed: WA06 Expected date: 04/27/14 Expected time: 3:03 AM Means of arrival: Ambulance Comments: abd pain

## 2014-04-29 NOTE — ED Provider Notes (Signed)
Medical screening examination/treatment/procedure(s) were performed by non-physician practitioner and as supervising physician I was immediately available for consultation/collaboration.   EKG Interpretation None        Wynetta Fines, MD 04/29/14 2235

## 2014-07-03 ENCOUNTER — Ambulatory Visit (INDEPENDENT_AMBULATORY_CARE_PROVIDER_SITE_OTHER): Payer: BLUE CROSS/BLUE SHIELD | Admitting: Medical

## 2014-07-03 ENCOUNTER — Encounter: Payer: Self-pay | Admitting: Medical

## 2014-07-03 VITALS — BP 120/65 | HR 62 | Ht 64.0 in | Wt 150.6 lb

## 2014-07-03 DIAGNOSIS — N9489 Other specified conditions associated with female genital organs and menstrual cycle: Secondary | ICD-10-CM | POA: Diagnosis not present

## 2014-07-03 DIAGNOSIS — N898 Other specified noninflammatory disorders of vagina: Secondary | ICD-10-CM

## 2014-07-03 LAB — POCT URINALYSIS DIP (DEVICE)
Bilirubin Urine: NEGATIVE
Glucose, UA: NEGATIVE mg/dL
Hgb urine dipstick: NEGATIVE
KETONES UR: NEGATIVE mg/dL
Leukocytes, UA: NEGATIVE
Nitrite: NEGATIVE
PH: 7 (ref 5.0–8.0)
PROTEIN: NEGATIVE mg/dL
SPECIFIC GRAVITY, URINE: 1.015 (ref 1.005–1.030)
UROBILINOGEN UA: 0.2 mg/dL (ref 0.0–1.0)

## 2014-07-03 NOTE — Progress Notes (Signed)
Patient ID: Holly Vazquez, female   DOB: 11/21/1980, 33 y.o.   MRN: [id number]  History:  [name]  is a 33 y.o. [id number] who presents to clinic today with complaint of vaginal discharge. Patient has noted an odor off and on x 2 months. She states that she is unsure if it is vaginal or urinary. Some white discharge, other times yellow mucus. She denies pelvic pain, fever, abnormal vaginal bleeding. She notes occasional dysuria. She denies frequency or urgency or urination. She is in a monogamous relationship and does not want STD testing today.   The following portions of the patient's history were reviewed and updated as appropriate: allergies, current medications, past family history, past medical history, past social history, past surgical history and problem list.  Review of Systems:  Pertinent items are noted in HPI.  Objective:  Physical Exam BP 120/65  Pulse 62  Ht 5\' 4"  (1.626 m)  Wt 150 lb 9.6 oz (68.312 kg)  BMI 25.84 kg/m2 GENERAL: Well-developed, well-nourished female in no acute distress.  HEENT: Normocephalic, atraumatic.  LUNGS: Normal effort HEART: Regular rate  ABDOMEN: Soft, nontender, nondistended. No organomegaly. Normal bowel sounds appreciated in all quadrants.  PELVIC: Normal external female genitalia. Vagina is pink and rugated.  Normal discharge. Normal cervix contour. Wet prep obtained. Uterus is normal in size. No adnexal mass or tenderness.  EXTREMITIES: No cyanosis, clubbing, or edema  Labs and Imaging Wet prep obtained  Results for orders placed in visit on 07/03/14 (from the past 24 hour(s))  POCT URINALYSIS DIP (DEVICE)     Status: None   Collection Time    07/03/14  9:37 AM      Result Value Ref Range   Glucose, UA NEGATIVE  NEGATIVE mg/dL   Bilirubin Urine NEGATIVE  NEGATIVE   Ketones, ur NEGATIVE  NEGATIVE mg/dL   Specific Gravity, Urine 1.015  1.005 - 1.030   Hgb urine dipstick NEGATIVE  NEGATIVE   pH 7.0  5.0 - 8.0   Protein, ur NEGATIVE  NEGATIVE mg/dL   Urobilinogen, UA 0.2  0.0 - 1.0 mg/dL   Nitrite NEGATIVE  NEGATIVE   Leukocytes, UA NEGATIVE  NEGATIVE    Assessment & Plan:  Assessment: Vaginal odor  Plans: Wet prep obtained Patient will be contacted with any abnormal results Discussed hygiene products for avoiding BV infections  Luvenia Redden, PA-C 07/03/2014 9:30 AM

## 2014-07-03 NOTE — Progress Notes (Signed)
Notices a fishy odor. Not sure if its coming from vaginal area or urine.

## 2014-07-03 NOTE — Patient Instructions (Signed)
Bacterial Vaginosis Bacterial vaginosis is an infection of the vagina. It happens when too many of certain germs (bacteria) grow in the vagina. HOME CARE  Take your medicine as told by your doctor.  Finish your medicine even if you start to feel better.  Do not have sex until you finish your medicine and are better.  Tell your sex partner that you have an infection. They should see their doctor for treatment.  Practice safe sex. Use condoms. Have only one sex partner. GET HELP IF:  You are not getting better after 3 days of treatment.  You have more grey fluid (discharge) coming from your vagina than before.  You have more pain than before.  You have a fever. MAKE SURE YOU:   Understand these instructions.  Will watch your condition.  Will get help right away if you are not doing well or get worse. Document Released: 07/11/2008 Document Revised: 07/23/2013 Document Reviewed: 05/14/2013 Exit[employer] Patient Information 2015 [employer]. This information is not intended to replace advice given to you by your health care provider. Make sure you discuss any questions you have with your health care provider.  

## 2014-07-04 LAB — WET PREP, GENITAL
Clue Cells Wet Prep HPF POC: NONE SEEN
Trich, Wet Prep: NONE SEEN
WBC WET PREP: NONE SEEN
YEAST WET PREP: NONE SEEN

## 2014-07-09 ENCOUNTER — Telehealth: Payer: Self-pay | Admitting: General Practice

## 2014-07-09 NOTE — Telephone Encounter (Signed)
Patient called and left message stating she had an appt here on Friday the 18th and was told someone would call her Monday, she would like her test results. Called patient, no answer- left message stating we are trying to return your phone call, please call us back at the clinics and you can let us know if we can leave information on your voicemail.

## 2014-07-10 NOTE — Telephone Encounter (Signed)
Called patient, no answer- left message stating we are trying to return your phone call, please call us back at the clinics if you still need assistance  

## 2014-07-13 ENCOUNTER — Telehealth: Payer: Self-pay | Admitting: General Practice

## 2014-07-13 NOTE — Telephone Encounter (Signed)
Patient called and left message stating she would like her test results from a week and a half ago. Can leave results on voicemail and any medication needed if she doesn't answer. Called patient back and informed her of normal results. Patient verbalized understanding and had no other questions

## 2014-08-17 ENCOUNTER — Encounter: Payer: Self-pay | Admitting: Medical

## 2014-10-20 ENCOUNTER — Ambulatory Visit (INDEPENDENT_AMBULATORY_CARE_PROVIDER_SITE_OTHER): Payer: BLUE CROSS/BLUE SHIELD

## 2014-10-20 DIAGNOSIS — R300 Dysuria: Secondary | ICD-10-CM | POA: Diagnosis not present

## 2014-10-20 LAB — POCT URINALYSIS DIP (DEVICE)
Bilirubin Urine: NEGATIVE
GLUCOSE, UA: NEGATIVE mg/dL
Ketones, ur: NEGATIVE mg/dL
NITRITE: POSITIVE — AB
PROTEIN: NEGATIVE mg/dL
Specific Gravity, Urine: 1.01 (ref 1.005–1.030)
Urobilinogen, UA: 0.2 mg/dL (ref 0.0–1.0)
pH: 5 (ref 5.0–8.0)

## 2014-10-20 NOTE — Progress Notes (Signed)
Patient here today with c/o of burning with urination for the last three days. Urinalysis obtained and positive for leuks, nitrates and blood. Urine sent for culture. Informed patient she will be contacted once urine culture has resulted and the correct ABX will be prescribed. Patient verbalized understanding and gratitude. No questions or concerns.

## 2014-10-22 ENCOUNTER — Telehealth: Payer: Self-pay

## 2014-10-22 DIAGNOSIS — N390 Urinary tract infection, site not specified: Principal | ICD-10-CM

## 2014-10-22 DIAGNOSIS — B9620 Unspecified Escherichia coli [E. coli] as the cause of diseases classified elsewhere: Secondary | ICD-10-CM

## 2014-10-22 MED ORDER — CIPROFLOXACIN HCL 500 MG PO TABS: 500.0000 mg | ORAL_TABLET | Freq: Two times a day (BID) | ORAL | Status: DC

## 2014-10-22 NOTE — Telephone Encounter (Signed)
Consulted Dr. Ihor Dow regarding patient's urine culture results-- prescribed Cipro 500mg  BID X 3 days. Called patient and informed her of ABX and RX at pharmacy. Advised she call clinic if symptoms do not subside a week after taking ABX. Patient verbalized understanding and gratitude. No questions or concerns.

## 2014-10-23 LAB — URINE CULTURE: Colony Count: >=100000

## 2014-12-11 ENCOUNTER — Telehealth: Payer: Self-pay | Admitting: *Deleted

## 2014-12-11 DIAGNOSIS — B9620 Unspecified Escherichia coli [E. coli] as the cause of diseases classified elsewhere: Secondary | ICD-10-CM

## 2014-12-11 DIAGNOSIS — N390 Urinary tract infection, site not specified: Principal | ICD-10-CM

## 2014-12-11 MED ORDER — CIPROFLOXACIN HCL 500 MG PO TABS: 500.0000 mg | ORAL_TABLET | Freq: Two times a day (BID) | ORAL | Status: DC

## 2014-12-11 NOTE — Telephone Encounter (Signed)
Pt left message stating that she was treated for UTI last month.  She is having the same sx and requests new Rx or advise as to what she needs to do. I called pt and verified that her sx had stopped after the previous Rx in January. She stated that they did and the new sx just started this morning. I consulted with Dr. Elly Modena and advised pt that a new Rx will be sent to her pharmacy. If she experiences UTI sx after this round of antibiotics, she will need to come in to submit a urine sample for culture.  Pt agreed and voiced understanding.

## 2015-02-03 ENCOUNTER — Ambulatory Visit (INDEPENDENT_AMBULATORY_CARE_PROVIDER_SITE_OTHER): Payer: Medicaid Other | Admitting: *Deleted

## 2015-02-03 DIAGNOSIS — R350 Frequency of micturition: Secondary | ICD-10-CM

## 2015-02-03 DIAGNOSIS — R309 Painful micturition, unspecified: Secondary | ICD-10-CM

## 2015-02-03 LAB — POCT URINALYSIS DIP (DEVICE)
Bilirubin Urine: NEGATIVE
Glucose, UA: NEGATIVE mg/dL
Ketones, ur: NEGATIVE mg/dL
Nitrite: NEGATIVE
Protein, ur: NEGATIVE mg/dL
Specific Gravity, Urine: 1.01 (ref 1.005–1.030)
UROBILINOGEN UA: 0.2 mg/dL (ref 0.0–1.0)
pH: 7 (ref 5.0–8.0)

## 2015-02-03 NOTE — Progress Notes (Signed)
Thatianna left after leaving urine specimen. Called [facility] and she reports urinary frequency, denies pain or burning with urination. Per chart review and discussion with Eugenie Birks was treated for UTI in January and February. States she finished her meds both times. States she feels like she gets uti 1-2 days after rougher or more vigorous intercourse. I advised her is reccomended , especially in someone prone to Uti's to void after intercourse to help flush bacteria away.  I informed her I will send her urine for culture and if it shows she has a uti we will call her- possibly not until Monday or Tuesday as clinic closed Friday at 1200 and result will go to provider on Friday.  I also informed her I would discuss with provider and if there are any other orders today I will call her back. She voices understanding.   Discussed with Manya Silvas, CNM. She agrees with culture and will wait to treat pending culture results since patient denies pain or burning with urination.  May need urology consult - will decide after results return.

## 2015-02-05 LAB — URINE CULTURE: Colony Count: 5000

## 2015-02-15 ENCOUNTER — Telehealth: Payer: Self-pay | Admitting: *Deleted

## 2015-02-15 NOTE — Telephone Encounter (Signed)
Called Holly Vazquez and gave her the results of her urine culture from 02/03/15 which were negative. I informed her we usually call if results abnormal and need treatment, but is always fine to call and check.  She states she is feeling better, no pain or burining. States she has been drinking lots of water and taking cranberry pills. Instructed her to call us if she has any other problems. She voiced understanding.

## 2015-02-15 NOTE — Telephone Encounter (Signed)
Holly Vazquez left a message stating she left a sample a week ago and was told she may get results Monday but she  Hasn't heard anything.

## 2015-04-28 ENCOUNTER — Encounter: Payer: Self-pay | Admitting: Obstetrics & Gynecology

## 2015-04-28 ENCOUNTER — Ambulatory Visit (INDEPENDENT_AMBULATORY_CARE_PROVIDER_SITE_OTHER): Payer: BLUE CROSS/BLUE SHIELD | Admitting: Obstetrics & Gynecology

## 2015-04-28 VITALS — BP 106/60 | HR 62 | Temp 98.3°F | Ht 64.0 in | Wt 142.2 lb

## 2015-04-28 DIAGNOSIS — N898 Other specified noninflammatory disorders of vagina: Secondary | ICD-10-CM

## 2015-04-28 LAB — POCT URINALYSIS DIP (DEVICE)
Bilirubin Urine: NEGATIVE
Glucose, UA: NEGATIVE mg/dL
Hgb urine dipstick: NEGATIVE
KETONES UR: NEGATIVE mg/dL
Leukocytes, UA: NEGATIVE
Nitrite: NEGATIVE
PROTEIN: NEGATIVE mg/dL
Specific Gravity, Urine: >=1.03 (ref 1.005–1.030)
Urobilinogen, UA: 0.2 mg/dL (ref 0.0–1.0)
pH: 5.5 (ref 5.0–8.0)

## 2015-04-28 NOTE — Progress Notes (Signed)
Patient ID: Holly Vazquez, female   DOB: 1981/05/27, 34 y.o.   MRN: [id number]  Chief Complaint  Patient presents with  . Gynecologic Exam  Pt describes strange odor in discharge and when urinating.  Pt states she has burning/pain during intercourse.  HPI Holly Vazquez is a 34 y.o. female.  [id number] Patient's last menstrual period was 04/20/2015.  cc noted, sx for a few days. Husband has a vasectomy HPI  Past Medical History  Diagnosis Date  . Restless legs syndrome (RLS)   . Urinary tract infection, site not specified   . Absence of menstruation   . Contact dermatitis and other eczema, due to unspecified cause   . Anxiety   . Depression   . Heart murmur     States always told it was from anemia  . Anemia     due to heavy menses, on iron    Past Surgical History  Procedure Laterality Date  . Wisdom tooth extraction      Family History  Problem Relation Age of Onset  . Hypertension Father   . Heart disease Maternal Grandmother   . Stroke Maternal Grandmother   . Hypertension Maternal Grandmother     Social History History  Substance Use Topics  . Smoking status: Never Smoker   . Smokeless tobacco: Never Used  . Alcohol Use: No    Allergies  Allergen Reactions  . Latex Hives  . Nickel Dermatitis    trigger    Current Outpatient Prescriptions  Medication Sig Dispense Refill  . ferrous sulfate 300 (60 FE) MG/5ML syrup Take 300 mg by mouth daily.    . Multiple Vitamin (MULTIVITAMIN WITH MINERALS) TABS tablet Take 1 tablet by mouth daily.     No current facility-administered medications for this visit.    Review of Systems Review of Systems  Constitutional: Negative.   Respiratory: Negative.   Gastrointestinal: Negative.   Genitourinary: Positive for dysuria and vaginal discharge (odor post intercourse). Negative for menstrual problem and pelvic pain.    Blood pressure 106/60, pulse 62, temperature 98.3 F (36.8 C), height 5\' 4"  (1.626  m), weight 142 lb 3.2 oz (64.501 kg), last menstrual period 04/20/2015, not currently breastfeeding.  Physical Exam Physical Exam  Constitutional: She is oriented to person, place, and time. She appears well-developed. No distress.  Pulmonary/Chest: Effort normal.  Genitourinary: Vaginal discharge (clear mucus at os, wet prep done) found.  Neurological: She is alert and oriented to person, place, and time.  Skin: Skin is warm and dry.  Psychiatric: She has a normal mood and affect. Her behavior is normal.    Data Reviewed Office notes, labs  Assessment    Vaginal sx but source not clear on vaginal exam today, lab pending     Plan    Check wet prep result and notify patient        Holly Vazquez 04/28/2015, 2:53 PM

## 2015-04-28 NOTE — Patient Instructions (Signed)

## 2015-04-28 NOTE — Progress Notes (Signed)
Patient ID: Holly Vazquez, female   DOB: 1981/09/14, 34 y.o.   MRN: [id number] Pt describes strange odor in discharge and when urinating.  Pt states she has burning/pain during intercourse.

## 2015-04-29 LAB — WET PREP, GENITAL
TRICH WET PREP: NONE SEEN
YEAST WET PREP: NONE SEEN

## 2015-04-30 ENCOUNTER — Telehealth: Payer: Self-pay | Admitting: *Deleted

## 2015-04-30 MED ORDER — METRONIDAZOLE 500 MG PO TABS: 500.0000 mg | ORAL_TABLET | Freq: Two times a day (BID) | ORAL | Status: DC

## 2015-04-30 NOTE — Telephone Encounter (Signed)
Called pt and left message on her personal voice mail that her recent test shows she has BV. This is very common in women and is not a sexually transmitted infection. It is easy to treat with medication. The Rx has been sent to her pharmacy.  Please call back if she has any questions.

## 2015-05-20 ENCOUNTER — Telehealth: Payer: Self-pay

## 2015-05-20 MED ORDER — METRONIDAZOLE 500 MG PO TABS: 500.0000 mg | ORAL_TABLET | Freq: Two times a day (BID) | ORAL | Status: DC

## 2015-05-20 NOTE — Telephone Encounter (Signed)
Pt called the front desk and stated that she is having itching on the tongue, rash on her body.  Pt stated that she feels that since she has taken the medication she has been itching and rash.  I advised pt that she could take Benadryl for her symptoms to see if they subside.  Pt then stated that she is still having the same symptoms as before.  I advised pt that needed to take her medication as prescribed which could be a reason for her continuing with the symptoms.   Per Dr. Harolyn Rutherford, pt can have a refill of Flagyl and to make sure she takes as prescribed.  Called pt and informed pt that she has a refill of Flagyl sent to her pharmacy.  I strongly advised pt to take medication as prescribed.  Pt stated understanding with no further questions.

## 2015-05-21 ENCOUNTER — Telehealth: Payer: Self-pay | Admitting: *Deleted

## 2015-05-21 NOTE — Telephone Encounter (Signed)
Patient called and left a message 05/20/15 at 1:25 stating she came in a few weeks ago and diagnosed with BV and took medicine starting with an M for 7 days. States she is still having the symptoms and is just now calling because she had her cycle after completing the medicine. Wants to know if she needs to be rechecked or discuss options.

## 2015-05-21 NOTE — Telephone Encounter (Signed)
Called Holly Vazquez back and she reports she was told yesterday a refill of flagyl sent in to pharmacy, but she was concerned because she broke out in hives , rash, itching  after took flagyl last time and she was afraid to take it. I advised her not to take any more flagyl and that I recommend she be checked.  I will have front office call with an appointment for vaginal discharge for asap - hopefully next week. She voices understanding.

## 2015-05-23 ENCOUNTER — Emergency Department (HOSPITAL_COMMUNITY)
Admission: EM | Admit: 2015-05-23 | Discharge: 2015-05-23 | Disposition: A | Discharge status: Home / Self Care | Payer: BLUE CROSS/BLUE SHIELD | Attending: Emergency Medicine | Admitting: Emergency Medicine

## 2015-05-23 ENCOUNTER — Encounter (HOSPITAL_COMMUNITY): Payer: Self-pay | Admitting: *Deleted

## 2015-05-23 DIAGNOSIS — Z79899 Other long term (current) drug therapy: Secondary | ICD-10-CM | POA: Insufficient documentation

## 2015-05-23 DIAGNOSIS — T373X5A Adverse effect of other antiprotozoal drugs, initial encounter: Secondary | ICD-10-CM | POA: Insufficient documentation

## 2015-05-23 DIAGNOSIS — Z8742 Personal history of other diseases of the female genital tract: Secondary | ICD-10-CM | POA: Diagnosis not present

## 2015-05-23 DIAGNOSIS — R220 Localized swelling, mass and lump, head: Secondary | ICD-10-CM | POA: Diagnosis present

## 2015-05-23 DIAGNOSIS — Z87440 Personal history of urinary (tract) infections: Secondary | ICD-10-CM | POA: Insufficient documentation

## 2015-05-23 DIAGNOSIS — R011 Cardiac murmur, unspecified: Secondary | ICD-10-CM | POA: Diagnosis not present

## 2015-05-23 DIAGNOSIS — Z91040 Latex allergy status: Secondary | ICD-10-CM | POA: Diagnosis not present

## 2015-05-23 DIAGNOSIS — Z872 Personal history of diseases of the skin and subcutaneous tissue: Secondary | ICD-10-CM | POA: Diagnosis not present

## 2015-05-23 DIAGNOSIS — Z8659 Personal history of other mental and behavioral disorders: Secondary | ICD-10-CM | POA: Insufficient documentation

## 2015-05-23 DIAGNOSIS — D649 Anemia, unspecified: Secondary | ICD-10-CM | POA: Diagnosis not present

## 2015-05-23 DIAGNOSIS — T7840XA Allergy, unspecified, initial encounter: Secondary | ICD-10-CM

## 2015-05-23 MED ORDER — PREDNISONE 20 MG PO TABS
60.0000 mg | ORAL_TABLET | Freq: Once | ORAL | Status: AC
  Administered 2015-05-23: 60 mg via ORAL
  Filled 2015-05-23: qty 3

## 2015-05-23 MED ORDER — PREDNISONE 20 MG PO TABS: 40.0000 mg | ORAL_TABLET | Freq: Every day | ORAL | Status: DC

## 2015-05-23 NOTE — Discharge Instructions (Signed)
Take prednisone as directed until gone. Refer to attached documents for more information. Follow up with the recommended Allergist.

## 2015-05-23 NOTE — ED Provider Notes (Signed)
CSN: [id number]     Arrival date & time 05/23/15  2048 History   First MD Initiated Contact with Patient 05/23/15 2235     Chief Complaint  Patient presents with  . Oral Swelling     (Consider location/radiation/quality/duration/timing/severity/associated sxs/prior Treatment) HPI Comments: Patient is a 34 year old female who presents with a rash that has been intermittent for the past 8 days. The rash started gradually and progressively worsened since the onset. The rash is located on her entire body. Patient has tried benadryl without relief. The rash started after taking a course of Flagyl. Patient denies new exposures to soaps, lotions, detergent. Patient reports associated itching and facial swelling. No aggravating/alleviating factors. Patient denies fever, chills, NVD, sore throat, oral lesions, ocular involvement, throat closing, wheezing, SOB, chest pain, abdominal pain.      Past Medical History  Diagnosis Date  . Restless legs syndrome (RLS)   . Urinary tract infection, site not specified   . Absence of menstruation   . Contact dermatitis and other eczema, due to unspecified cause   . Anxiety   . Depression   . Heart murmur     States always told it was from anemia  . Anemia     due to heavy menses, on iron   Past Surgical History  Procedure Laterality Date  . Wisdom tooth extraction     Family History  Problem Relation Age of Onset  . Hypertension Father   . Heart disease Maternal Grandmother   . Stroke Maternal Grandmother   . Hypertension Maternal Grandmother    History  Substance Use Topics  . Smoking status: Never Smoker   . Smokeless tobacco: Never Used  . Alcohol Use: No   OB History    Gravida Para Term Preterm AB TAB SAB Ectopic Multiple Living   5 5 5  0 0 0 0 0 1 6     Review of Systems  Skin: Positive for rash.  All other systems reviewed and are negative.     Allergies  Latex and Nickel  Home Medications   Prior to Admission  medications   Medication Sig Start Date End Date Taking? Authorizing Provider  ferrous sulfate 300 (60 FE) MG/5ML syrup Take 300 mg by mouth daily.    Historical Provider, MD  metroNIDAZOLE (FLAGYL) 500 MG tablet Take 1 tablet (500 mg total) by mouth 2 (two) times daily. 05/20/15   Osborne Oman, MD  Multiple Vitamin (MULTIVITAMIN WITH MINERALS) TABS tablet Take 1 tablet by mouth daily.    Historical Provider, MD   BP 114/61 mmHg  Pulse 56  Temp(Src) 98 F (36.7 C)  Resp 18  Ht 5\' 4"  (1.626 m)  Wt 139 lb 8 oz (63.277 kg)  BMI 23.93 kg/m2  SpO2 98%  LMP 04/20/2015 Physical Exam  Constitutional: She is oriented to person, place, and time. She appears well-developed and well-nourished. No distress.  HENT:  Head: Normocephalic and atraumatic.  No facial swelling noted.   Eyes: Conjunctivae and EOM are normal.  Neck: Normal range of motion.  Cardiovascular: Normal rate and regular rhythm.  Exam reveals no gallop and no friction rub.   No murmur heard. Pulmonary/Chest: Effort normal and breath sounds normal. She has no wheezes. She has no rales. She exhibits no tenderness.  Abdominal: Soft. She exhibits no distension. There is no tenderness. There is no rebound.  Musculoskeletal: Normal range of motion.  Neurological: She is alert and oriented to person, place, and time. Coordination normal.  Speech is goal-oriented. Moves limbs without ataxia.   Skin: Skin is warm and dry.  Scattered areas of erythema with overlying excoriations on bilateral arms. No urticaria.   Psychiatric: She has a normal mood and affect. Her behavior is normal.  Nursing note and vitals reviewed.   ED Course  Procedures (including critical care time) Labs Review Labs Reviewed - No data to display  Imaging Review No results found.   EKG Interpretation None      MDM   Final diagnoses:  Allergic reaction, initial encounter    10:48 PM Patient will have prednisone. No anaphylaxis, SOB, or wheezing.  Vitals stable and patient afebrile.    Alvina Chou, PA-C 05/23/15 2305  Virgel Manifold, MD 05/30/15 1048

## 2015-05-23 NOTE — ED Notes (Signed)
Pt dressed and sitting in chair in room, pt asked to be placed on monitor pt states "im here for my itchy lips only." pt daughter in bed at this time, both eating graham crackers. Pt in nad. This RN states vitals can be done on dinamap, pt okay with this.

## 2015-05-23 NOTE — ED Notes (Signed)
The pt has had some swelling of her lips for 8 days after she took antibiotics for a vaginal infection.  She has felt itchy all over intermittently.  She has been taking benadryl foir the past 3 nights.  lmp thuis past friday

## 2015-06-07 ENCOUNTER — Ambulatory Visit: Payer: BLUE CROSS/BLUE SHIELD | Admitting: Obstetrics and Gynecology

## 2015-06-25 ENCOUNTER — Encounter: Payer: Self-pay | Admitting: Obstetrics and Gynecology

## 2015-06-25 ENCOUNTER — Ambulatory Visit (INDEPENDENT_AMBULATORY_CARE_PROVIDER_SITE_OTHER): Payer: BLUE CROSS/BLUE SHIELD | Admitting: Obstetrics and Gynecology

## 2015-06-25 VITALS — BP 118/81 | HR 75 | Temp 97.9°F | Ht 64.0 in | Wt 139.0 lb

## 2015-06-25 DIAGNOSIS — Z113 Encounter for screening for infections with a predominantly sexual mode of transmission: Secondary | ICD-10-CM | POA: Diagnosis not present

## 2015-06-25 DIAGNOSIS — N760 Acute vaginitis: Secondary | ICD-10-CM

## 2015-06-25 DIAGNOSIS — Z1151 Encounter for screening for human papillomavirus (HPV): Secondary | ICD-10-CM

## 2015-06-25 DIAGNOSIS — Z124 Encounter for screening for malignant neoplasm of cervix: Secondary | ICD-10-CM

## 2015-06-25 DIAGNOSIS — Z118 Encounter for screening for other infectious and parasitic diseases: Secondary | ICD-10-CM | POA: Diagnosis not present

## 2015-06-25 MED ORDER — SODIUM CHLORIDE 0.9 % IJ SOLN: 1.0000 "application " | Freq: Once | INTRAMUSCULAR | Status: DC

## 2015-06-25 NOTE — Progress Notes (Signed)
CLINIC ENCOUNTER NOTE  History:  34 y.o. [id number] here today for f/u vaginitis.  Diagnosed w/ BV little over a month ago, had hives and facial swelling a few days after taking. Symptoms are intermittent itching and burning in the vagina, and sometimes fishy odor. No dysuria or hematuria. NO vaginal bleeding. Doesn't smoke. Doesn't douche.   Past Medical History  Diagnosis Date  . Restless legs syndrome (RLS)   . Urinary tract infection, site not specified   . Absence of menstruation   . Contact dermatitis and other eczema, due to unspecified cause   . Anxiety   . Depression   . Heart murmur     States always told it was from anemia  . Anemia     due to heavy menses, on iron    Past Surgical History  Procedure Laterality Date  . Wisdom tooth extraction      The following portions of the patient's history were reviewed and updated as appropriate: allergies, current medications, past family history, past medical history, past social history, past surgical history and problem list.   Health Maintenance:  Last pap 2012.  Review of Systems:  See above; comprehensive review of systems was otherwise negative.  Objective:  Physical Exam BP 118/81 mmHg  Pulse 75  Temp(Src) 97.9 F (36.6 C)  Ht 5\' 4"  (1.626 m)  Wt 139 lb (63.05 kg)  BMI 23.85 kg/m2  LMP 06/13/2015 (Exact Date) CONSTITUTIONAL: Well-developed, well-nourished female in no acute distress.  HENT:  Normocephalic, atraumatic SKIN: Skin is warm and dry.  [place]: Alert  PSYCHIATRIC: Normal mood and affect.  CARDIOVASCULAR: Normal heart rate noted RESPIRATORY: Effort and breath sounds normal, no problems with respiration noted ABDOMEN: Soft, no distention noted.  No tenderness, rebound or guarding.  PELVIC: Normal vagina, normal cervix w/ ectopion, scant amount mucous discharge, no cmt or adnexal tenderness/fullness   Labs and Imaging No results found.  Assessment & Plan:   # Vaginitis - repeat wet prep -  intravaginal clindamycin - f/u gc/chlamydia - if all negative and clindamycin does not resolve symptoms, explained symptoms could be normal  - flagyl added to allergies  # Health maintenance - pap w/ hpv performed today  Routine preventative health maintenance measures emphasized.     Marvell Tamer B. Cordney Barstow, [place] for [employer], [place]

## 2015-06-26 LAB — WET PREP, GENITAL
CLUE CELLS WET PREP: NONE SEEN
TRICH WET PREP: NONE SEEN
Yeast Wet Prep HPF POC: NONE SEEN

## 2015-06-28 LAB — GC/CHLAMYDIA PROBE AMP (~~LOC~~) NOT AT ARMC
CHLAMYDIA, DNA PROBE: NEGATIVE
NEISSERIA GONORRHEA: NEGATIVE

## 2015-06-28 LAB — CYTOLOGY - PAP

## 2015-07-26 ENCOUNTER — Ambulatory Visit (INDEPENDENT_AMBULATORY_CARE_PROVIDER_SITE_OTHER): Payer: BLUE CROSS/BLUE SHIELD | Admitting: Allergy and Immunology

## 2015-07-26 ENCOUNTER — Encounter: Payer: Self-pay | Admitting: Allergy and Immunology

## 2015-07-26 VITALS — BP 100/70 | HR 70 | Temp 98.9°F | Resp 15 | Ht 64.0 in | Wt 138.4 lb

## 2015-07-26 DIAGNOSIS — J3089 Other allergic rhinitis: Secondary | ICD-10-CM

## 2015-07-26 DIAGNOSIS — L500 Allergic urticaria: Secondary | ICD-10-CM | POA: Insufficient documentation

## 2015-07-26 DIAGNOSIS — J309 Allergic rhinitis, unspecified: Secondary | ICD-10-CM | POA: Insufficient documentation

## 2015-07-26 HISTORY — DX: Allergic rhinitis, unspecified: J30.9

## 2015-07-26 HISTORY — DX: Allergic urticaria: L50.0

## 2015-07-26 MED ORDER — FLUTICASONE PROPIONATE 50 MCG/ACT NA SUSP: 2.0000 | Freq: Every day | NASAL | Status: DC

## 2015-07-26 MED ORDER — CETIRIZINE HCL 10 MG PO TABS: 10.0000 mg | ORAL_TABLET | Freq: Every day | ORAL | Status: DC

## 2015-07-26 NOTE — Patient Instructions (Addendum)
Allergic urticaria  Chronic idiopathic urticaria versus drug allergy to metronidazole There is no other obvious etiology identified. Skin tests to select food allergens were negative today. NSAIDs and emotional stress commonly exacerbate urticaria but are not the underlying etiology in this case. Physical urticarias are negative by history (i.e. pressure-induced, temperature, vibration, solar, etc.). History and lesions are not consistent with urticaria pigmentosa so I am not suspicious for mastocytosis. There are no concomitant symptoms concerning for anaphylaxis or constitutional symptoms worrisome for an underlying malignancy. We will not check labs at this time, however if the lesions recur, progress, or change in character, we will rule out other potential etiologies with labs.  If urticaria returns in the absence of metronidazole, then might rhinitis all is not the culprit.   For now, patient has been instructed to avoid metronidazole.     Should symptoms recur, a journal is to be kept recording any foods eaten, beverages consumed, medications taken within a 6 hour period prior to the onset of symptoms, as well as record activities being performed, and environmental conditions. For any symptoms concerning for anaphylaxis, 911 is to be called immediately.  Allergic rhinitis  Aeroallergen avoidance measures have been discussed and provided in written form.  A prescription has been provided for fluticasone nasal spray, one spray per nostril 1-2 times daily as needed. Proper nasal spray technique has been discussed and demonstrated.  Cetirizine 10 mg daily as needed.  If allergen avoidance measures and medications fail to adequately relieve symptoms, we will consider immunotherapy.  Urticaria (Hives)  . Cetirizine (Zyrtec) 10mg  once a day.  If symptoms continue then increase to .  Marland Kitchen Cetirizine (Zyrtec) 10mg   twice a day.  If symptoms continue then increase to .  Marland Kitchen Cetirizine (Zyrtec) 10mg    twice a day and Ranitidine (Zantac) 150 mg once a day.  If symptoms continue then increase to.  . Cetirizine (Zyrtec) 10mg   twice a day and Ranitidine (Zantac) 150 mg twice a day  May use Benadryl as needed for breakthrough symptoms       If no symptoms for 7 days, then step down dosage   Reducing Pollen Exposure  The American Academy of Allergy, Asthma and Immunology suggests the following steps to reduce your exposure to pollen during allergy seasons.  8. Do not hang sheets or clothing out to dry; pollen may collect on these items. 9. Do not mow lawns or spend time around freshly cut grass; mowing stirs up pollen. 10. Keep windows closed at night.  Keep car windows closed while driving. 11. Minimize morning activities outdoors, a time when pollen counts are usually at their highest. 12. Stay indoors as much as possible when pollen counts or humidity is high and on windy days when pollen tends to remain in the air longer. 13. Use air conditioning when possible.  Many air conditioners have filters that trap the pollen spores. 14. Use a HEPA room air filter to remove pollen form the indoor air you breathe.  Control of Mold Allergen  Mold and fungi can grow on a variety of surfaces provided certain temperature and moisture conditions exist.  Outdoor molds grow on plants, decaying vegetation and soil.  The major outdoor mold, Alternaria dn Cladosporium, are found in very high numbers during hot and dry conditions.  Generally, a late Summer - Fall peak is seen for common outdoor fungal spores.  Rain will temporarily lower outdoor mold spore count, but counts rise rapidly when the rainy period ends.  The  most important indoor molds are Aspergillus and Penicillium.  Dark, humid and poorly ventilated basements are ideal sites for mold growth.  The next most common sites of mold growth are the bathroom and the kitchen.  Outdoor [employer] 1. Use air conditioning and keep windows closed 2. Avoid  exposure to decaying vegetation. 3. Avoid leaf raking. 4. Avoid grain handling. 5. Consider wearing a face mask if working in moldy areas.  Indoor Mold Control 1. Maintain humidity below 50%. 2. Clean washable surfaces with 5% bleach solution. 3. Remove sources e.g. Contaminated carpets.  Control of Cockroach Allergen  Cockroach allergen has been identified as an important cause of acute attacks of asthma, especially in urban settings.  There are fifty-five species of cockroach that exist in the [place], however only three, the [place], [place] species produce allergen that can affect patients with Asthma.  Allergens can be obtained from fecal particles, egg casings and secretions from cockroaches.  1. Remove food sources. 2. Reduce access to water. 3. Seal access and entry points. 4. Spray runways with 0.5-1% Diazinon or Chlorpyrifos 5. Blow boric acid power under stoves and refrigerator. 6. Place bait stations (hydramethylnon) at feeding sites.  Control of House Dust Mite Allergen  House dust mites play a major role in allergic asthma and rhinitis.  They occur in environments with high humidity wherever human skin, the food for dust mites is found. High levels have been detected in dust obtained from mattresses, pillows, carpets, upholstered furniture, bed covers, clothes and soft toys.  The principal allergen of the house dust mite is found in its feces.  A gram of dust may contain 1,000 mites and 250,000 fecal particles.  Mite antigen is easily measured in the air during house cleaning activities.  15. Encase mattresses, including the box spring, and pillow, in an air tight cover.  Seal the zipper end of the encased mattresses with wide adhesive tape. 16. Wash the bedding in water of 130 degrees Farenheit weekly.  Avoid cotton comforters/quilts and flannel bedding: the most ideal bed covering is the dacron comforter. 17. Remove all upholstered furniture from the  bedroom. 18. Remove carpets, carpet padding, rugs, and non-washable window drapes from the bedroom.  Wash drapes weekly or use plastic window coverings. 19. Remove all non-washable stuffed toys from the bedroom.  Wash stuffed toys weekly. 20. Have the room cleaned frequently with a vacuum cleaner and a damp dust-mop.  The patient should not be in a room which is being cleaned and should wait 1 hour after cleaning before going into the room. 21. Close and seal all heating outlets in the bedroom.  Otherwise, the room will become filled with dust-laden air.  An electric heater can be used to heat the room. 22. Reduce indoor humidity to less than 50%.  Do not use a humidifier.

## 2015-07-26 NOTE — Assessment & Plan Note (Addendum)
   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 Skin tests to select food allergens were negative today. NSAIDs and emotional stress commonly exacerbate urticaria but are not the underlying etiology in this case. Physical urticarias are negative by history (i.e. pressure-induced, temperature, vibration, solar, etc.). History and lesions are not consistent with urticaria pigmentosa so I am not suspicious for mastocytosis. There are no concomitant symptoms concerning for anaphylaxis or constitutional symptoms worrisome for an underlying malignancy. We will not check labs at this time, however if the lesions recur, progress, or change in character, we will rule out other potential etiologies with labs.  If urticaria returns in the absence of metronidazole, then might rhinitis all is not the culprit.   For now, patient has been instructed to avoid metronidazole.     Should symptoms recur, a journal is to be kept recording any foods eaten, beverages consumed, medications taken within a 6 hour period prior to the onset of symptoms, as well as record activities being performed, and environmental conditions. For any symptoms concerning for anaphylaxis, 911 is to be called immediately.

## 2015-07-26 NOTE — Assessment & Plan Note (Signed)
   Aeroallergen avoidance measures have been discussed and provided in written form.  A prescription has been provided for fluticasone nasal spray, one spray per nostril 1-2 times daily as needed. Proper nasal spray technique has been discussed and demonstrated.  Cetirizine 10 mg daily as needed.  If allergen avoidance measures and medications fail to adequately relieve symptoms, we will consider immunotherapy.

## 2015-07-26 NOTE — Addendum Note (Signed)
Addended by: Cristela Felt on: 07/26/2015 12:11 PM   Modules accepted: Orders

## 2015-07-26 NOTE — Progress Notes (Signed)
History of present illness: HPI Comments: Approximately 3 months ago, Holly Vazquez began to experience recurrent episodes of hives.  The onset of the hives occurred approximately one week after she had finished a course of metronidazole for bacterial vaginosis.  Typical distribution included the entire body.  The hives are described as red, raised, and pruritic. Individual hives last less than 24 hours without residual pigmentation or bruising. The patient does not experience concomitant cardiopulmonary or GI symptoms. The patient has not experienced unexpected weight loss, recurrent fevers or drenching night sweats. No specific medication, food or environmental triggers have been identified. The symptoms do not seem to correlate with NSAIDs use. Symptoms do not seem to correlate with emotional stress. The patient did not have signs or symptoms of infection at the time of symptom onset. Holly Vazquez has tried to control symptoms with the following medications: over-the-counter antihistamines and topical steroids. These medications have not offered excellent temporary relief of symptoms.  Holly Vazquez experiences nasal congestion and sneezing.  These symptoms seem to be most prominent in the springtime and summer.   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 Skin tests to select food allergens were negative today. NSAIDs and emotional stress commonly exacerbate urticaria but are not the underlying etiology in this case. Physical urticarias are negative by history (i.e. pressure-induced, temperature, vibration, solar, etc.). History and lesions are not consistent with urticaria pigmentosa so I am not suspicious for mastocytosis. There are no concomitant symptoms concerning for anaphylaxis or constitutional symptoms worrisome for an underlying malignancy. We will not check labs at this time, however if the  lesions recur, progress, or change in character, we will rule out other potential etiologies with labs.  If urticaria returns in the absence of metronidazole, then might rhinitis all is not the culprit.   For now, patient has been instructed to avoid metronidazole.     Should symptoms recur, a journal is to be kept recording any foods eaten, beverages consumed, medications taken within a 6 hour period prior to the onset of symptoms, as well as record activities being performed, and environmental conditions. For any symptoms concerning for anaphylaxis, 911 is to be called immediately.  Allergic rhinitis  Aeroallergen avoidance measures have been discussed and provided in written form.  A prescription has been provided for fluticasone nasal spray, one spray per nostril 1-2 times daily as needed. Proper nasal spray technique has been discussed and demonstrated.  Cetirizine 10 mg daily as needed.  If allergen avoidance measures and medications fail to adequately relieve symptoms, we will consider immunotherapy.    Medications ordered this encounter: Meds ordered this encounter  Medications  . fluticasone (FLONASE) 50 MCG/ACT nasal spray    Sig: Place 2 sprays into both nostrils daily.    Dispense:  1 g    Refill:  5  . cetirizine (ZYRTEC) 10 MG tablet    Sig: Take 1 tablet (10 mg total) by mouth daily.    Dispense:  30 tablet    Refill:  5    Diagnositics: Food allergen skin tests were negative despite a positive histamine control. Environmental skin tests were positive to dust mite antigen, cat hair, and grass pollen.    Physical examination: Blood pressure 100/70, pulse 70, temperature 98.9 F (37.2 C), temperature source Oral, resp. rate 15, height 5\' 4"  (1.626 m), weight 138 lb 6.4 oz (62.778 kg), not currently breastfeeding.  General: Alert, interactive, in no acute  distress. HEENT: TMs pearly gray, turbinates moderately edematous, post-pharynx minimally  erythematous. Neck: Supple without lymphadenopathy. Lungs: Clear to auscultation without wheezing, rhonchi or rales. CV: Normal S1, S2 without murmurs. Skin: Warm and dry, without lesions or rashes. Extremities:  No clubbing, cyanosis or edema. Neuro:   Grossly intact.  Review of systems: Review of Systems  Constitutional: Negative for fever, chills and weight loss.  HENT: Negative for nosebleeds.   Eyes: Negative for blurred vision.  Respiratory: Negative for hemoptysis.   Cardiovascular: Negative for chest pain.  Gastrointestinal: Negative for diarrhea and constipation.  Genitourinary: Negative for dysuria.  Musculoskeletal: Negative for myalgias and joint pain.  Neurological: Negative for dizziness.  Endo/Heme/Allergies: Does not bruise/bleed easily.    Past medical history: Past Medical History  Diagnosis Date  . Restless legs syndrome (RLS)   . Urinary tract infection, site not specified   . Absence of menstruation   . Contact dermatitis and other eczema, due to unspecified cause   . Anxiety   . Depression   . Heart murmur     States always told it was from anemia  . Anemia     due to heavy menses, on iron    Past surgical history: Past Surgical History  Procedure Laterality Date  . Wisdom tooth extraction      Family history: Family History  Problem Relation Age of Onset  . Hypertension Father   . Heart disease Maternal Grandmother   . Stroke Maternal Grandmother   . Hypertension Maternal Grandmother   . Eczema Son   . Eczema Son   . Eczema Son     Social history: Social History   Social History  . Marital Status: Married    Spouse Name: N/A  . Number of Children: N/A  . Years of Education: N/A   Occupational History  . Not on file.   Social History Main Topics  . Smoking status: Never Smoker   . Smokeless tobacco: Never Used  . Alcohol Use: No  . Drug Use: No  . Sexual Activity: Yes    Birth Control/ Protection: Condom   Other Topics  Concern  . Not on file   Social History Narrative   Environmental History: Patient lives in a house with carpeting throughout and central air/heat.  There are no pets in the home.  Known medication allergies: Allergies  Allergen Reactions  . Flagyl [Metronidazole] Hives  . Latex Hives  . Nickel Dermatitis    trigger  . Penicillins Rash    Outpatient medications:   Medication List       This list is accurate as of: 07/26/15 10:11 AM.  Always use your most recent med list.               cetirizine 10 MG tablet  Commonly known as:  ZYRTEC  Take 1 tablet (10 mg total) by mouth daily.     clindamycin 150 mg in sodium chloride 0.9 % 30 mL  Place 1 application vaginally once.     CRANBERRY PO  Take 500 mg by mouth daily.     ferrous sulfate 300 (60 FE) MG/5ML syrup  Take 300 mg by mouth daily.     fluticasone 50 MCG/ACT nasal spray  Commonly known as:  FLONASE  Place 2 sprays into both nostrils daily.     multivitamin with minerals Tabs tablet  Take 1 tablet by mouth daily.        I appreciate the opportunity to take part in this  Devota's care. Please do not hesitate to contact me with questions.  Sincerely,   R. Edgar Frisk, MD

## 2015-11-01 ENCOUNTER — Ambulatory Visit: Payer: BLUE CROSS/BLUE SHIELD | Admitting: Allergy and Immunology

## 2016-03-20 ENCOUNTER — Emergency Department (HOSPITAL_COMMUNITY): Payer: Self-pay

## 2016-03-20 ENCOUNTER — Encounter (HOSPITAL_COMMUNITY): Payer: Self-pay | Admitting: Emergency Medicine

## 2016-03-20 ENCOUNTER — Emergency Department (HOSPITAL_COMMUNITY)
Admission: EM | Admit: 2016-03-20 | Discharge: 2016-03-21 | Disposition: A | Discharge status: Home / Self Care | Payer: Self-pay | Attending: Emergency Medicine | Admitting: Emergency Medicine

## 2016-03-20 DIAGNOSIS — R42 Dizziness and giddiness: Secondary | ICD-10-CM | POA: Insufficient documentation

## 2016-03-20 DIAGNOSIS — Z880 Allergy status to penicillin: Secondary | ICD-10-CM | POA: Insufficient documentation

## 2016-03-20 DIAGNOSIS — Z87440 Personal history of urinary (tract) infections: Secondary | ICD-10-CM | POA: Insufficient documentation

## 2016-03-20 DIAGNOSIS — Z91040 Latex allergy status: Secondary | ICD-10-CM | POA: Insufficient documentation

## 2016-03-20 DIAGNOSIS — Z79899 Other long term (current) drug therapy: Secondary | ICD-10-CM | POA: Insufficient documentation

## 2016-03-20 DIAGNOSIS — Z8669 Personal history of other diseases of the nervous system and sense organs: Secondary | ICD-10-CM | POA: Insufficient documentation

## 2016-03-20 DIAGNOSIS — Z872 Personal history of diseases of the skin and subcutaneous tissue: Secondary | ICD-10-CM | POA: Insufficient documentation

## 2016-03-20 DIAGNOSIS — Z3202 Encounter for pregnancy test, result negative: Secondary | ICD-10-CM | POA: Insufficient documentation

## 2016-03-20 DIAGNOSIS — Z8659 Personal history of other mental and behavioral disorders: Secondary | ICD-10-CM | POA: Insufficient documentation

## 2016-03-20 DIAGNOSIS — R011 Cardiac murmur, unspecified: Secondary | ICD-10-CM | POA: Insufficient documentation

## 2016-03-20 DIAGNOSIS — D649 Anemia, unspecified: Secondary | ICD-10-CM | POA: Insufficient documentation

## 2016-03-20 LAB — COMPREHENSIVE METABOLIC PANEL
ALBUMIN: 4.6 g/dL (ref 3.5–5.0)
ALK PHOS: 61 U/L (ref 38–126)
ALT: 12 U/L — AB (ref 14–54)
ALT: 13 U/L — ABNORMAL LOW (ref 14–54)
ANION GAP: 7 (ref 5–15)
AST: 18 U/L (ref 15–41)
AST: 19 U/L (ref 15–41)
Albumin: 4.6 g/dL (ref 3.5–5.0)
Alkaline Phosphatase: 63 U/L (ref 38–126)
Anion gap: 8 (ref 5–15)
BILIRUBIN TOTAL: 0.4 mg/dL (ref 0.3–1.2)
BILIRUBIN TOTAL: 0.4 mg/dL (ref 0.3–1.2)
BUN: 13 mg/dL (ref 6–20)
BUN: 14 mg/dL (ref 6–20)
CHLORIDE: 104 mmol/L (ref 101–111)
CO2: 26 mmol/L (ref 22–32)
CO2: 26 mmol/L (ref 22–32)
CREATININE: 0.73 mg/dL (ref 0.44–1.00)
Calcium: 9.8 mg/dL (ref 8.9–10.3)
Calcium: 9.6 mg/dL (ref 8.9–10.3)
Chloride: 105 mmol/L (ref 101–111)
Creatinine, Ser: 0.72 mg/dL (ref 0.44–1.00)
GFR calc Af Amer: >60 mL/min (ref >60)
Glucose, Bld: 84 mg/dL (ref 65–99)
Glucose, Bld: 89 mg/dL (ref 65–99)
POTASSIUM: 3.5 mmol/L (ref 3.5–5.1)
Potassium: 3.5 mmol/L (ref 3.5–5.1)
Sodium: 138 mmol/L (ref 135–145)
Sodium: 138 mmol/L (ref 135–145)
TOTAL PROTEIN: 8 g/dL (ref 6.5–8.1)
TOTAL PROTEIN: 8.3 g/dL — AB (ref 6.5–8.1)

## 2016-03-20 LAB — CBC WITH DIFFERENTIAL/PLATELET
BASOS ABS: 0 10*3/uL (ref 0.0–0.1)
Basophils Relative: 0 %
Eosinophils Absolute: 0 10*3/uL (ref 0.0–0.7)
Eosinophils Relative: 0 %
HEMATOCRIT: 37.6 % (ref 36.0–46.0)
HEMATOCRIT: 38.8 % (ref 36.0–46.0)
HEMOGLOBIN: 11.3 g/dL — AB (ref 12.0–15.0)
Hemoglobin: 11.9 g/dL — ABNORMAL LOW (ref 12.0–15.0)
LYMPHS ABS: 3.4 10*3/uL (ref 0.7–4.0)
Lymphocytes Relative: 35 %
MCH: 22.3 pg — ABNORMAL LOW (ref 26.0–34.0)
MCH: 22.8 pg — ABNORMAL LOW (ref 26.0–34.0)
MCHC: 30.1 g/dL (ref 30.0–36.0)
MCHC: 30.7 g/dL (ref 30.0–36.0)
MCV: 74.2 fL — AB (ref 78.0–100.0)
MCV: 74.3 fL — AB (ref 78.0–100.0)
MONOS PCT: 5 %
Monocytes Absolute: 0.5 10*3/uL (ref 0.1–1.0)
NEUTROS ABS: 5.7 10*3/uL (ref 1.7–7.7)
Neutrophils Relative %: 60 %
Platelets: 280 10*3/uL (ref 150–400)
Platelets: 296 10*3/uL (ref 150–400)
RBC: 5.07 MIL/uL (ref 3.87–5.11)
RBC: 5.22 MIL/uL — ABNORMAL HIGH (ref 3.87–5.11)
RDW: 13.7 % (ref 11.5–15.5)
RDW: 13.6 % (ref 11.5–15.5)
WBC: 8.6 10*3/uL (ref 4.0–10.5)
WBC: 9.6 10*3/uL (ref 4.0–10.5)

## 2016-03-20 LAB — POC URINE PREG, ED: PREG TEST UR: NEGATIVE

## 2016-03-20 MED ORDER — SODIUM CHLORIDE 0.9 % IV BOLUS (SEPSIS)
1000.0000 mL | Freq: Once | INTRAVENOUS | Status: AC
Start: 2016-03-20 — End: 2016-03-20
  Administered 2016-03-20: 1000 mL via INTRAVENOUS

## 2016-03-20 NOTE — ED Notes (Signed)
PLACED PATIENT IN A GOWN TAKE PATIENT TO BATHROOM FOR URINE SAMPLE

## 2016-03-20 NOTE — ED Notes (Signed)
Spoke to lab, will add CK

## 2016-03-20 NOTE — ED Notes (Addendum)
Last 3 weeks has been lifting heavy weghts and she has been lightheaded after , comes and goes, off and on , has been tired, dizzy when she stands

## 2016-03-20 NOTE — ED Notes (Signed)
Pt ambulated independently in the hallway to the restroom.  Gait steady and even

## 2016-03-21 LAB — CK: CK TOTAL: 113 U/L (ref 38–234)

## 2016-03-21 NOTE — Discharge Instructions (Signed)
We saw you in the ER for the dizziness. All the results in the ER are normal. We are not sure what is causing your symptoms. The workup in the ER is not complete, and is limited to screening for life threatening and emergent conditions only, so please see a primary care doctor for further evaluation.  WE DO RECOMMEND THAT YOU HYDRATE WELL AND STOP ANY SUPPLEMENTAL PILLS. Please return to the ER if you have worsening chest pain, shortness of breath, near fainting.     Dizziness Dizziness is a common problem. It is a feeling of unsteadiness or light-headedness. You may feel like you are about to faint. Dizziness can lead to injury if you stumble or fall. Anyone can become dizzy, but dizziness is more common in older adults. This condition can be caused by a number of things, including medicines, dehydration, or illness. HOME CARE INSTRUCTIONS Taking these steps may help with your condition: Eating and Drinking  Drink enough fluid to keep your urine clear or pale yellow. This helps to keep you from becoming dehydrated. Try to drink more clear fluids, such as water.  Do not drink alcohol.  Limit your caffeine intake if directed by your health care provider.  Limit your salt intake if directed by your health care provider. Activity  Avoid making quick movements.  Rise slowly from chairs and steady yourself until you feel okay.  In the morning, first sit up on the side of the bed. When you feel okay, stand slowly while you hold onto something until you know that your balance is fine.  Move your legs often if you need to stand in one place for a long time. Tighten and relax your muscles in your legs while you are standing.  Do not drive or operate heavy machinery if you feel dizzy.  Avoid bending down if you feel dizzy. Place items in your home so that they are easy for you to reach without leaning over. Lifestyle  Do not use any tobacco products, including cigarettes, chewing  tobacco, or electronic cigarettes. If you need help quitting, ask your health care provider.  Try to reduce your stress level, such as with yoga or meditation. Talk with your health care provider if you need help. General Instructions  Watch your dizziness for any changes.  Take medicines only as directed by your health care provider. Talk with your health care provider if you think that your dizziness is caused by a medicine that you are taking.  Tell a friend or a family member that you are feeling dizzy. If he or she notices any changes in your behavior, have this person call your health care provider.  Keep all follow-up visits as directed by your health care provider. This is important. SEEK MEDICAL CARE IF:  Your dizziness does not go away.  Your dizziness or light-headedness gets worse.  You feel nauseous.  You have reduced hearing.  You have new symptoms.  You are unsteady on your feet or you feel like the room is spinning. SEEK IMMEDIATE MEDICAL CARE IF:  You vomit or have diarrhea and are unable to eat or drink anything.  You have problems talking, walking, swallowing, or using your arms, hands, or legs.  You feel generally weak.  You are not thinking clearly or you have trouble forming sentences. It may take a friend or family member to notice this.  You have chest pain, abdominal pain, shortness of breath, or sweating.  Your vision changes.  You notice  any bleeding.  You have a headache.  You have neck pain or a stiff neck.  You have a fever.   This information is not intended to replace advice given to you by your health care provider. Make sure you discuss any questions you have with your health care provider.   Document Released: 03/28/2001 Document Revised: 02/16/2015 Document Reviewed: 09/28/2014 Elsevier Interactive Patient Education 2016 Else[employer] Orthostatic Hypotension Orthostatic hypotension is a sudden drop in blood pressure. It happens  when you quickly stand up from a seated or lying position. You may feel dizzy or light-headed. This can last for just a few seconds or for up to a few minutes. It is usually not a serious problem. However, if this happens frequently or gets worse, it can be a sign of something more serious. CAUSES  Different things can cause orthostatic hypotension, including:   Loss of body fluids (dehydration).  Medicines that lower blood pressure.  Sudden changes in posture, such as standing up quickly after you have been sitting or lying down.  Taking too much of your medicine. SIGNS AND SYMPTOMS   Light-headedness or dizziness.   Fainting or near-fainting.   A fast heart rate.   Weakness.   Feeling tired (fatigue).  DIAGNOSIS  Your health care provider may do several things to help diagnose your condition and identify the cause. These may include:   Taking a medical history and doing a physical exam.  Checking your blood pressure. Your health care provider will check your blood pressure when you are:  Lying down.  Sitting.  Standing.  Using tilt table testing. In this test, you lie down on a table that moves from a lying position to a standing position. You will be strapped onto the table. This test monitors your blood pressure and heart rate when you are in different positions. TREATMENT  Treatment will vary depending on the cause. Possible treatments include:   Changing the dosage of your medicines.  Wearing compression stockings on your lower legs.  Standing up slowly after sitting or lying down.  Eating more salt.  Eating frequent, small meals.  In some cases, getting IV fluids.  Taking medicine to enhance fluid retention. HOME CARE INSTRUCTIONS  Only take over-the-counter or prescription medicines as directed by your health care provider.  Follow your health care provider's instructions for changing the dosage of your current medicines.  Do not stop or adjust  your medicine on your own.  Stand up slowly after sitting or lying down. This allows your body to adjust to the different position.  Wear compression stockings as directed.  Eat extra salt as directed.  Do not add extra salt to your diet unless directed to by your health care provider.  Eat frequent, small meals.  Avoid standing suddenly after eating.  Avoid hot showers or excessive heat as directed by your health care provider.  Keep all follow-up appointments. SEEK MEDICAL CARE IF:  You continue to feel dizzy or light-headed after standing.  You feel groggy or confused.  You feel cold, clammy, or sick to your stomach (nauseous).  You have blurred vision.  You feel short of breath. SEEK IMMEDIATE MEDICAL CARE IF:   You faint after standing.  You have chest pain.  You have difficulty breathing.   You lose feeling or movement in your arms or legs.   You have slurred speech or difficulty talking, or you are unable to talk.  MAKE SURE YOU:   Understand these instructions.  Will watch your condition.  Will get help right away if you are not doing well or get worse.   This information is not intended to replace advice given to you by your health care provider. Make sure you discuss any questions you have with your health care provider.   Document Released: 09/22/2002 Document Revised: 10/07/2013 Document Reviewed: 07/25/2013 Elsevier Interactive Patient Education [employer].

## 2016-03-21 NOTE — ED Provider Notes (Signed)
CSN: [id number]     Arrival date & time 03/20/16  1549 History   First MD Initiated Contact with Patient 03/20/16 1852     Chief Complaint  Patient presents with  . Dizziness     (Consider location/radiation/quality/duration/timing/severity/associated sxs/prior Treatment) HPI Comments: Pt comes in with cc of dizziness. Hx of gall stones. She is a healthy female. Reports that the last few weeks she has been working out extra hard, doing more lifting than ever before. Pt has been noticing that she has been having dizzy spells - described as lightheadedness. The symptoms occur when she gets up, and sometimes after po intake. She has hx of gall stones, but she denies abd pain. Pt has no hx of PE, DVT and denies any exogenous estrogen use, long distance travels or surgery in the past 6 weeks, active cancer, recent immobilization. Pt has been taking some supplements with her exercise regimen. No drug use.   ROS 10 Systems reviewed and are negative for acute change except as noted in the HPI.     Patient is a 35 y.o. female presenting with dizziness. The history is provided by the patient.  Dizziness   Past Medical History  Diagnosis Date  . Restless legs syndrome (RLS)   . Urinary tract infection, site not specified   . Absence of menstruation   . Contact dermatitis and other eczema, due to unspecified cause   . Anxiety   . Depression   . Heart murmur     States always told it was from anemia  . Anemia     due to heavy menses, on iron   Past Surgical History  Procedure Laterality Date  . Wisdom tooth extraction     Family History  Problem Relation Age of Onset  . Hypertension Father   . Heart disease Maternal Grandmother   . Stroke Maternal Grandmother   . Hypertension Maternal Grandmother   . Eczema Son   . Eczema Son   . Eczema Son    Social History  Substance Use Topics  . Smoking status: Never Smoker   . Smokeless tobacco: Never Used  . Alcohol Use: No   OB  History    Gravida Para Term Preterm AB TAB SAB Ectopic Multiple Living   5 5 5  0 0 0 0 0 1 6     Review of Systems  Neurological: Positive for dizziness.      Allergies  Flagyl; Latex; Nickel; and Penicillins  Home Medications   Prior to Admission medications   Medication Sig Start Date End Date Taking? Authorizing Provider  Cyanocobalamin (VITAMIN B-12 PO) Take 1 tablet by mouth daily.   Yes Historical Provider, MD  IRON PO Take 1 tablet by mouth daily.   Yes Historical Provider, MD  cetirizine (ZYRTEC) 10 MG tablet Take 1 tablet (10 mg total) by mouth daily. Patient not taking: Reported on 03/20/2016 07/26/15   Adelina Mings, MD  clindamycin 150 mg in sodium chloride 0.9 % 30 mL Place 1 application vaginally once. Patient not taking: Reported on 07/26/2015 06/25/15   Gwynne Edinger, MD  fluticasone [facility]) 50 MCG/ACT nasal spray Place 2 sprays into both nostrils daily. Patient not taking: Reported on 03/20/2016 07/26/15   Sedalia Muta Bobbitt, MD   BP 105/66 mmHg  Pulse 63  Temp(Src) 98.2 F (36.8 C) (Oral)  Resp 15  SpO2 97% Physical Exam  Constitutional: She is oriented to person, place, and time. She appears well-developed.  HENT:  Head: Normocephalic  and atraumatic.  Eyes: Conjunctivae and EOM are normal. Pupils are equal, round, and reactive to light.  Neck: Normal range of motion. Neck supple. No JVD present.  Cardiovascular: Normal rate, regular rhythm and normal heart sounds.   Pulmonary/Chest: Effort normal and breath sounds normal. No respiratory distress.  Abdominal: Soft. Bowel sounds are normal. She exhibits no distension. There is no tenderness. There is no rebound and no guarding.  Neurological: She is alert and oriented to person, place, and time. No cranial nerve deficit. Coordination normal.  Skin: Skin is warm and dry.    ED Course  Procedures (including critical care time) Labs Review Labs Reviewed  CBC WITH DIFFERENTIAL/PLATELET -  Abnormal; Notable for the following:    Hemoglobin 11.3 (*)    MCV 74.2 (*)    MCH 22.3 (*)    All other components within normal limits  COMPREHENSIVE METABOLIC PANEL - Abnormal; Notable for the following:    ALT 12 (*)    All other components within normal limits  CBC WITH DIFFERENTIAL/PLATELET - Abnormal; Notable for the following:    RBC 5.22 (*)    Hemoglobin 11.9 (*)    MCV 74.3 (*)    MCH 22.8 (*)    All other components within normal limits  COMPREHENSIVE METABOLIC PANEL - Abnormal; Notable for the following:    Total Protein 8.3 (*)    ALT 13 (*)    All other components within normal limits  CK  POC URINE PREG, ED    Imaging Review Ct Head Wo Contrast  03/20/2016  CLINICAL DATA:  Dizziness for 1 week EXAM: CT HEAD WITHOUT CONTRAST TECHNIQUE: Contiguous axial images were obtained from the base of the skull through the vertex without intravenous contrast. COMPARISON:  None. FINDINGS: The bony calvarium is intact. The ventricles are of normal size and configuration. No findings to suggest acute hemorrhage, acute infarction or space-occupying mass lesion are noted. IMPRESSION: No acute intracranial abnormality noted. Electronically Signed   By: Inez Catalina M.D.   On: 03/20/2016 18:36   I have personally reviewed and evaluated these images and lab results as part of my medical decision-making.   EKG Interpretation   Date/Time:  Monday March 20 2016 17:00:52 EDT Ventricular Rate:  72 PR Interval:  138 QRS Duration: 90 QT Interval:  392 QTC Calculation: 429 R Axis:   87 Text Interpretation:  Normal sinus rhythm Right atrial enlargement  Nonspecific ST abnormality Abnormal ECG No old tracing to compare No acute  changes Confirmed by Kathrynn Humble, MD, Thelma Comp [phone]) on 03/20/2016 9:09:09 PM      MDM   Final diagnoses:  Dizziness   Pt comes in with cc of dizziness. Orthostatic like symptoms. She does endorse less po intake, but there is no emesis/diarrhea. Pt has no neck  trauma. Symptoms only present with standing and after po intake - i am not sure what to make of the latter. Neck exam is normal. No trauma to the neck, no palpable bruits. Social hx, besides the supplement and extra exercise is neg. We will get basic labs and CK. CT head from triage is neg. For now advised good hydration and stopping the supplements. PE workup not indicated - pt is PERC neg. Strict ER return precautions have been discussed, and patient is agreeing with the plan and is comfortable with the workup done and the recommendations from the ER.     Varney Biles, MD 03/21/16 [phone]

## 2016-03-21 NOTE — ED Notes (Signed)
Patient able to ambulate independently  

## 2016-04-13 ENCOUNTER — Encounter: Payer: BLUE CROSS/BLUE SHIELD | Admitting: Obstetrics & Gynecology

## 2016-05-17 ENCOUNTER — Ambulatory Visit (INDEPENDENT_AMBULATORY_CARE_PROVIDER_SITE_OTHER): Payer: BLUE CROSS/BLUE SHIELD

## 2016-05-17 VITALS — Wt 143.5 lb

## 2016-05-17 DIAGNOSIS — R350 Frequency of micturition: Secondary | ICD-10-CM | POA: Diagnosis not present

## 2016-05-17 LAB — POCT URINALYSIS DIP (DEVICE)
BILIRUBIN URINE: NEGATIVE
Glucose, UA: NEGATIVE mg/dL
KETONES UR: NEGATIVE mg/dL
Nitrite: POSITIVE — AB
PH: 6.5 (ref 5.0–8.0)
Protein, ur: NEGATIVE mg/dL
Specific Gravity, Urine: 1.025 (ref 1.005–1.030)
Urobilinogen, UA: 1 mg/dL (ref 0.0–1.0)

## 2016-05-17 MED ORDER — FLUCONAZOLE 150 MG PO TABS: 150.0000 mg | ORAL_TABLET | Freq: Once | ORAL | 0 refills | Status: AC

## 2016-05-17 MED ORDER — CIPROFLOXACIN HCL 500 MG PO TABS: 500.0000 mg | ORAL_TABLET | Freq: Two times a day (BID) | ORAL | 0 refills | Status: DC

## 2016-05-17 MED ORDER — PHENAZOPYRIDINE HCL 200 MG PO TABS: 200.0000 mg | ORAL_TABLET | Freq: Three times a day (TID) | ORAL | 0 refills | Status: DC | PRN

## 2016-05-17 NOTE — Progress Notes (Signed)
Patient presented to clinic for questionable UTI. Patient is positive for nitrate and leuk. I have called in some cipro and Pyridium for patient to start taking. Patient also requested a medication for yeast just in case she develops one. Patient verbalizes understanding at this time.

## 2016-05-20 LAB — URINE CULTURE

## 2016-08-10 ENCOUNTER — Ambulatory Visit: Payer: Medicaid Other

## 2016-08-10 DIAGNOSIS — R8290 Unspecified abnormal findings in urine: Secondary | ICD-10-CM

## 2016-08-10 DIAGNOSIS — N390 Urinary tract infection, site not specified: Secondary | ICD-10-CM

## 2016-08-10 DIAGNOSIS — R319 Hematuria, unspecified: Secondary | ICD-10-CM

## 2016-08-10 LAB — POCT URINALYSIS DIP (DEVICE)
Bilirubin Urine: NEGATIVE
Glucose, UA: NEGATIVE mg/dL
KETONES UR: NEGATIVE mg/dL
Nitrite: POSITIVE — AB
Protein, ur: NEGATIVE mg/dL
SPECIFIC GRAVITY, URINE: 1.02 (ref 1.005–1.030)
UROBILINOGEN UA: 0.2 mg/dL (ref 0.0–1.0)
pH: 7 (ref 5.0–8.0)

## 2016-08-10 MED ORDER — PHENAZOPYRIDINE HCL 200 MG PO TABS: 200.0000 mg | ORAL_TABLET | Freq: Three times a day (TID) | ORAL | 0 refills | Status: DC | PRN

## 2016-08-10 MED ORDER — SULFAMETHOXAZOLE-TRIMETHOPRIM 800-160 MG PO TABS: 1.0000 | ORAL_TABLET | Freq: Two times a day (BID) | ORAL | 0 refills | Status: DC

## 2016-08-10 NOTE — Progress Notes (Signed)
Pt here today for uti symptoms.  Pt reports having urinary frequency and also some pain with urination.  UA resulted trace blood, moderate leuks, and + nitrates.  Per standing orders, pt e-prescribed Septra DS one tablet po bid for 3 days and Pyridium 200 mg po tid PRN.  Pt informed that urine will be sent for UC and if there is any change of antibiotic tx then we will call her but to know that we may not get the results back until Monday due to the office closing. Pt stated understanding with no further questions.

## 2016-08-13 LAB — URINE CULTURE

## 2017-05-15 ENCOUNTER — Ambulatory Visit: Payer: BLUE CROSS/BLUE SHIELD

## 2017-05-28 ENCOUNTER — Ambulatory Visit: Payer: Medicaid Other

## 2017-05-28 ENCOUNTER — Other Ambulatory Visit (HOSPITAL_COMMUNITY)
Admission: RE | Admit: 2017-05-28 | Discharge: 2017-05-28 | Disposition: A | Discharge status: Home / Self Care | Payer: BLUE CROSS/BLUE SHIELD | Source: Ambulatory Visit | Attending: Obstetrics & Gynecology | Admitting: Obstetrics & Gynecology

## 2017-05-28 DIAGNOSIS — R8290 Unspecified abnormal findings in urine: Secondary | ICD-10-CM

## 2017-05-28 DIAGNOSIS — N898 Other specified noninflammatory disorders of vagina: Secondary | ICD-10-CM | POA: Diagnosis not present

## 2017-05-28 DIAGNOSIS — N390 Urinary tract infection, site not specified: Secondary | ICD-10-CM

## 2017-05-28 LAB — POCT URINALYSIS DIP (DEVICE)
Bilirubin Urine: NEGATIVE
Glucose, UA: NEGATIVE mg/dL
Ketones, ur: NEGATIVE mg/dL
NITRITE: POSITIVE — AB
PH: 7 (ref 5.0–8.0)
PROTEIN: NEGATIVE mg/dL
Specific Gravity, Urine: 1.015 (ref 1.005–1.030)
Urobilinogen, UA: 0.2 mg/dL (ref 0.0–1.0)

## 2017-05-28 NOTE — Progress Notes (Signed)
Pt here today due to fishy odor vaginal discharge.  Pt also reports having burning sensation on outside of vagina when she urinates.  Pt states that she had intercourse a week ago and the odor and burning sensation came after that.  Advised pt that we will send her urine and swab for results and that we will notify her if any tx needs to be implemented.  Pt stated understanding with no further questions.

## 2017-05-30 LAB — CERVICOVAGINAL ANCILLARY ONLY
BACTERIAL VAGINITIS: NEGATIVE
CANDIDA VAGINITIS: NEGATIVE
Chlamydia: NEGATIVE
Neisseria Gonorrhea: NEGATIVE
Trichomonas: NEGATIVE

## 2017-05-31 LAB — URINE CULTURE

## 2017-06-14 ENCOUNTER — Telehealth: Payer: Self-pay | Admitting: General Practice

## 2017-06-14 ENCOUNTER — Other Ambulatory Visit: Payer: Self-pay

## 2017-06-14 DIAGNOSIS — N390 Urinary tract infection, site not specified: Secondary | ICD-10-CM

## 2017-06-14 DIAGNOSIS — R319 Hematuria, unspecified: Principal | ICD-10-CM

## 2017-06-14 MED ORDER — SULFAMETHOXAZOLE-TRIMETHOPRIM 800-160 MG PO TABS: 1.0000 | ORAL_TABLET | Freq: Two times a day (BID) | ORAL | 0 refills | Status: DC

## 2017-06-14 NOTE — Telephone Encounter (Signed)
Patient has a UTI. She will need to be treated with Bactrim DS 800-160 for 3 days. Patient is aware of results.

## 2017-09-27 ENCOUNTER — Ambulatory Visit: Payer: Managed Care, Other (non HMO) | Admitting: Physician Assistant

## 2017-09-27 ENCOUNTER — Other Ambulatory Visit: Payer: Self-pay

## 2017-09-27 ENCOUNTER — Encounter: Payer: Self-pay | Admitting: Physician Assistant

## 2017-09-27 VITALS — BP 112/64 | HR 68 | Temp 98.6°F | Resp 16 | Ht 64.0 in | Wt 137.0 lb

## 2017-09-27 DIAGNOSIS — R42 Dizziness and giddiness: Secondary | ICD-10-CM | POA: Diagnosis not present

## 2017-09-27 DIAGNOSIS — R5383 Other fatigue: Secondary | ICD-10-CM | POA: Diagnosis not present

## 2017-09-27 NOTE — Progress Notes (Signed)
PRIMARY CARE AT [place], [place] [address] [phone]  Date:  09/27/2017   Name:  Holly Vazquez   DOB:  1981/08/30   MRN:  [id number]  PCP:  Patient, No Pcp Per    History of Present Illness:  Holly Vazquez is a 36 y.o. female patient who presents to PCP with  Chief Complaint  Patient presents with  . Establish Care    pt states she feels light headed or fatigue after workout, she wants a check up     She recently started to lift weights again.  She has noticed that she eats something heavy like a carb meal and feel very tired and lightheaded.  She was doing step machine with 25 minutes continuous, and felt mildly lightheaded.  She will have palpitations, but never when she is working out.  Water intake is 1-2 bottles per day.   She owns a business, as a Probation officer.  No polyuria, diarrhea.  She has noted some mild vision changes.   No pre-syncopal sensation of about to pass out.  But she will feel lightheaded.   She does have an iron deficiency.  History of bleeding heavy.  She will have her cycle for 5 days, with 4 days of heavy cycle.  She states that apple cide vinegar will help her symptoms.     Patient Active Problem List   Diagnosis Date Noted  . 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 Allergic rhinitis 07/26/2015  . Anemia 07/06/2011    Past Medical History:  Diagnosis Date  . Absence of menstruation   . Anemia    due to heavy menses, on iron  . Anxiety   . Contact dermatitis and other eczema, due to unspecified cause   . Depression   . Heart murmur    States always told it was from anemia  . Restless legs syndrome (RLS)   . Urinary tract infection, site not specified     Past Surgical History:  Procedure Laterality Date  . WISDOM TOOTH EXTRACTION      Social History   Tobacco Use  . Smoking status: Never Smoker  . Smokeless tobacco: Never Used  Substance Use Topics  . Alcohol use: No  . Drug use: No    Family History  Problem  Relation Age of Onset  . Hypertension Father   . Heart disease Maternal Grandmother   . Stroke Maternal Grandmother   . Hypertension Maternal Grandmother   . Eczema Son   . Eczema Son   . Eczema Son     Allergies  Allergen Reactions  . Flagyl [Metronidazole] Hives  . Latex Hives  . Nickel Dermatitis    trigger  . Penicillins Rash    Medication list has been reviewed and updated.  Current Outpatient Medications on File Prior to Visit  Medication Sig Dispense Refill  . cetirizine (ZYRTEC) 10 MG tablet Take 1 tablet (10 mg total) by mouth daily. (Patient not taking: Reported on 09/27/2017) 30 tablet 5  . ciprofloxacin (CIPRO) 500 MG tablet Take 1 tablet (500 mg total) by mouth 2 (two) times daily. (Patient not taking: Reported on 05/17/2016) 6 tablet 0  . clindamycin 150 mg in sodium chloride 0.9 % 30 mL Place 1 application vaginally once. (Patient not taking: Reported on 16/60/6301) 3 applicator 0  . Cyanocobalamin (VITAMIN B-12 PO) Take 1 tablet by mouth daily.    . fluticasone (FLONASE) 50 MCG/ACT nasal spray Place 2 sprays into both nostrils daily. (Patient not taking:  Reported on 03/20/2016) 1 g 5  . IRON PO Take 1 tablet by mouth daily.    . phenazopyridine (PYRIDIUM) 200 MG tablet Take 1 tablet (200 mg total) by mouth 3 (three) times daily as needed for pain. (Patient not taking: Reported on 09/27/2017) 10 tablet 0  . phenazopyridine (PYRIDIUM) 200 MG tablet Take 1 tablet (200 mg total) by mouth 3 (three) times daily as needed for pain. (Patient not taking: Reported on 09/27/2017) 6 tablet 0  . sulfamethoxazole-trimethoprim (BACTRIM DS,SEPTRA DS) 800-160 MG tablet Take 1 tablet by mouth 2 (two) times daily. (Patient not taking: Reported on 09/27/2017) 6 tablet 0   No current facility-administered medications on file prior to visit.     ROS ROS otherwise unremarkable unless listed above.  Physical Examination: BP 112/64   Pulse 68   Temp 98.6 F (37 C) (Oral)   Resp 16    Ht _0  (1.626 m)   Wt 137 lb (62.1 kg)   LMP 09/16/2017   SpO2 100%   BMI 23.52 kg/m  Ideal Body Weight: Weight in (lb) to have BMI = 25: 145.3  Physical Exam  Constitutional: She is oriented to person, place, and time. She appears well-developed and well-nourished. No distress.  HENT:  Head: Normocephalic and atraumatic.  Right Ear: Tympanic membrane, external ear and ear canal normal.  Left Ear: Tympanic membrane, external ear and ear canal normal.  Nose: No mucosal edema or rhinorrhea. Right sinus exhibits no maxillary sinus tenderness and no frontal sinus tenderness. Left sinus exhibits no maxillary sinus tenderness and no frontal sinus tenderness.  Mouth/Throat: No uvula swelling. No oropharyngeal exudate, posterior oropharyngeal edema or posterior oropharyngeal erythema.  Eyes: Conjunctivae and EOM are normal. Pupils are equal, round, and reactive to light.  Cardiovascular: Normal rate, regular rhythm, normal heart sounds and normal pulses. Exam reveals no gallop, no distant heart sounds and no friction rub.  No murmur heard. Pulses:      Radial pulses are 2+ on the right side, and 2+ on the left side.       Dorsalis pedis pulses are 2+ on the right side, and 2+ on the left side.  Pulmonary/Chest: Effort normal and breath sounds normal. No respiratory distress. She has no decreased breath sounds. She has no wheezes. She has no rhonchi.  Abdominal: Soft. Normal appearance and bowel sounds are normal. There is no tenderness.  Lymphadenopathy:       Head (right side): No submandibular, no tonsillar, no preauricular and no posterior auricular adenopathy present.       Head (left side): No submandibular, no tonsillar, no preauricular and no posterior auricular adenopathy present.  Neurological: She is alert and oriented to person, place, and time.  Skin: She is not diaphoretic.  Psychiatric: She has a normal mood and affect. Her behavior is normal.    Orthostatic VS for the past 24  hrs (Last 3 readings):  BP- Lying Pulse- Lying BP- Sitting Pulse- Sitting BP- Standing at 0 minutes Pulse- Standing at 0 minutes  09/27/17 1218 128/78 72 129/79 64 119/64 64   ekg appears non-acute findings.  Assessment and Plan: Holly Vazquez is a 36 y.o. female who is here today for cc of  Chief Complaint  Patient presents with  . Establish Care    pt states she feels light headed or fatigue after workout, she wants a check up  obtaining general labs.  This could be secondary to just heavy meals.  Possible anemia, diabetes, thyroid disease,  etc.  Lightheaded - Plan: EKG 12-Lead, CBC, CMP14+EGFR, TSH  Holly Drape, PA-C [facility] 12/16/20187:27 PM

## 2017-09-27 NOTE — Patient Instructions (Addendum)
I am going to look at your blood work, and contact you. Your ekg does not look acute.  Your physical exam and orthostats look fine (sitting, laying standing blood pressure).   For now, I would like you to hydrate well with 64 oz of water if not more.  Let's avoid too strenuous movement until I get the blood work back.  It was great to meet you.  Please open up your mychart, so I can send you the labs directly.   Dizziness Dizziness is a common problem. It makes you feel unsteady or light-headed. You may feel like you are about to pass out (faint). Dizziness can lead to getting hurt if you stumble or fall. Dizziness can be caused by many things, including:  Medicines.  Not having enough water in your body (dehydration).  Illness.  Follow these instructions at home: Eating and drinking  Drink enough fluid to keep your pee (urine) clear or pale yellow. This helps to keep you from getting dehydrated. Try to drink more clear fluids, such as water.  Do not drink alcohol.  Limit how much caffeine you drink or eat, if your doctor tells you to do that.  Limit how much salt (sodium) you drink or eat, if your doctor tells you to do that. Activity  Avoid making quick movements. ? When you stand up from sitting in a chair, steady yourself until you feel okay. ? In the morning, first sit up on the side of the bed. When you feel okay, stand slowly while you hold onto something. Do this until you know that your balance is fine.  If you need to stand in one place for a long time, move your legs often. Tighten and relax the muscles in your legs while you are standing.  Do not drive or use heavy machinery if you feel dizzy.  Avoid bending down if you feel dizzy. Place items in your home so you can reach them easily without leaning over. Lifestyle  Do not use any products that contain nicotine or tobacco, such as cigarettes and e-cigarettes. If you need help quitting, ask your doctor.  Try to  lower your stress level. You can do this by using methods such as yoga or meditation. Talk with your doctor if you need help. General instructions  Watch your dizziness for any changes.  Take over-the-counter and prescription medicines only as told by your doctor. Talk with your doctor if you think that you are dizzy because of a medicine that you are taking.  Tell a friend or a family member that you are feeling dizzy. If he or she notices any changes in your behavior, have this person call your doctor.  Keep all follow-up visits as told by your doctor. This is important. Contact a doctor if:  Your dizziness does not go away.  Your dizziness or light-headedness gets worse.  You feel sick to your stomach (nauseous).  You have trouble hearing.  You have new symptoms.  You are unsteady on your feet.  You feel like the room is spinning. Get help right away if:  You throw up (vomit) or have watery poop (diarrhea), and you cannot eat or drink anything.  You have trouble: ? Talking. ? Walking. ? Swallowing. ? Using your arms, hands, or legs.  You feel generally weak.  You are not thinking clearly, or you have trouble forming sentences. A friend or family member may notice this.  You have: ? Chest pain. ? Pain in your  belly (abdomen). ? Shortness of breath. ? Sweating.  Your vision changes.  You are bleeding.  You have a very bad headache.  You have neck pain or a stiff neck.  You have a fever. These symptoms may be an emergency. Do not wait to see if the symptoms will go away. Get medical help right away. Call your local emergency services (911 in the [place] Do not drive yourself to the hospital. Summary  Dizziness makes you feel unsteady or light-headed. You may feel like you are about to pass out (faint).  Drink enough fluid to keep your pee (urine) clear or pale yellow. Do not drink alcohol.  Avoid making quick movements if you feel dizzy.  Watch your  dizziness for any changes. This information is not intended to replace advice given to you by your health care provider. Make sure you discuss any questions you have with your health care provider. Document Released: 09/21/2011 Document Revised: 10/19/2016 Document Reviewed: 10/19/2016 Elsevier Interactive Patient Education  2017 [employer].    IF you received an x-ray today, you will receive an invoice from [facility]. Please contact [facility] at [phone] with questions or concerns regarding your invoice.   IF you received labwork today, you will receive an invoice from [place] [place]. Please contact [facility] at [phone] with questions or concerns regarding your invoice.   Our billing staff will not be able to assist you with questions regarding bills from these companies.  You will be contacted with the lab results as soon as they are available. The fastest way to get your results is to activate your My Chart account. Instructions are located on the last page of this paperwork. If you have not heard from [place] regarding the results in 2 weeks, please contact this office.

## 2017-09-28 LAB — CBC
Hematocrit: 34.6 % (ref 34.0–46.6)
Hemoglobin: 10.9 g/dL — ABNORMAL LOW (ref 11.1–15.9)
MCH: 22.8 pg — AB (ref 26.6–33.0)
MCHC: 31.5 g/dL (ref 31.5–35.7)
MCV: 72 fL — ABNORMAL LOW (ref 79–97)
PLATELETS: 313 10*3/uL (ref 150–379)
RBC: 4.79 x10E6/uL (ref 3.77–5.28)
RDW: 14.7 % (ref 12.3–15.4)
WBC: 7 10*3/uL (ref 3.4–10.8)

## 2017-09-28 LAB — CMP14+EGFR
ALK PHOS: 57 IU/L (ref 39–117)
ALT: 10 IU/L (ref 0–32)
AST: 17 IU/L (ref 0–40)
Albumin/Globulin Ratio: 1.8 (ref 1.2–2.2)
Albumin: 4.9 g/dL (ref 3.5–5.5)
BILIRUBIN TOTAL: 0.3 mg/dL (ref 0.0–1.2)
BUN/Creatinine Ratio: 18 (ref 9–23)
BUN: 13 mg/dL (ref 6–20)
CHLORIDE: 100 mmol/L (ref 96–106)
CO2: 25 mmol/L (ref 20–29)
Calcium: 9.9 mg/dL (ref 8.7–10.2)
Creatinine, Ser: 0.74 mg/dL (ref 0.57–1.00)
GFR calc non Af Amer: 106 mL/min/{1.73_m2} (ref >59)
GFR, EST AFRICAN AMERICAN: 122 mL/min/{1.73_m2} (ref >59)
GLUCOSE: 86 mg/dL (ref 65–99)
Globulin, Total: 2.7 g/dL (ref 1.5–4.5)
Potassium: 3.6 mmol/L (ref 3.5–5.2)
Sodium: 141 mmol/L (ref 134–144)
TOTAL PROTEIN: 7.6 g/dL (ref 6.0–8.5)

## 2017-09-28 LAB — TSH: TSH: 1 u[IU]/mL (ref 0.450–4.500)

## 2017-10-03 NOTE — Addendum Note (Signed)
Addended by: Gari Crown D on: 10/03/2017 06:27 PM   Modules accepted: Orders

## 2017-10-04 LAB — HEMOGLOBIN A1C
Est. average glucose Bld gHb Est-mCnc: 111 mg/dL
Hgb A1c MFr Bld: 5.5 % (ref 4.8–5.6)

## 2017-10-22 ENCOUNTER — Other Ambulatory Visit: Payer: Self-pay | Admitting: Physician Assistant

## 2017-10-22 DIAGNOSIS — D649 Anemia, unspecified: Secondary | ICD-10-CM

## 2017-10-23 MED ORDER — FERROUS SULFATE 325 (65 FE) MG PO TABS: 325.0000 mg | ORAL_TABLET | ORAL | 0 refills | Status: DC

## 2017-11-09 ENCOUNTER — Encounter: Payer: Self-pay | Admitting: Physician Assistant

## 2017-12-21 ENCOUNTER — Ambulatory Visit: Payer: Managed Care, Other (non HMO) | Admitting: Physician Assistant

## 2017-12-21 ENCOUNTER — Encounter: Payer: Self-pay | Admitting: Physician Assistant

## 2017-12-21 VITALS — BP 109/71 | HR 66 | Temp 98.2°F | Resp 17 | Ht 63.0 in | Wt 144.0 lb

## 2017-12-21 DIAGNOSIS — L299 Pruritus, unspecified: Secondary | ICD-10-CM

## 2017-12-21 DIAGNOSIS — L239 Allergic contact dermatitis, unspecified cause: Secondary | ICD-10-CM

## 2017-12-21 DIAGNOSIS — L2389 Allergic contact dermatitis due to other agents: Secondary | ICD-10-CM | POA: Diagnosis not present

## 2017-12-21 DIAGNOSIS — W57XXXA Bitten or stung by nonvenomous insect and other nonvenomous arthropods, initial encounter: Secondary | ICD-10-CM

## 2017-12-21 MED ORDER — PREDNISONE 20 MG PO TABS: ORAL_TABLET | ORAL | 0 refills | Status: DC

## 2017-12-21 MED ORDER — HYDROXYZINE HCL 25 MG PO TABS
25.0000 mg | ORAL_TABLET | Freq: Three times a day (TID) | ORAL | 0 refills | Status: DC | PRN
Start: 2017-12-21 — End: 2020-07-05

## 2017-12-21 NOTE — Patient Instructions (Addendum)
Start prednisone for the next 6 days.  Hydroxyzine is for itching. Take this as needed Flonase for nasal allergies ([facility] is way cheaper).   Acupuncture: [place] (sliding pay scale) or [place].    Thank you for coming in today. I hope you feel we met your needs.  Feel free to call PCP if you have any questions or further requests.  Please consider signing up for MyChart if you do not already have it, as this is a great way to communicate with me.  Best,  Whitney McVey, PA-C   Prednisone tablets What is this medicine? PREDNISONE (PRED ni sone) is a corticosteroid. It is commonly used to treat inflammation of the skin, joints, lungs, and other organs. Common conditions treated include asthma, allergies, and arthritis. It is also used for other conditions, such as blood disorders and diseases of the adrenal glands. This medicine may be used for other purposes; ask your health care provider or pharmacist if you have questions. COMMON BRAND NAME(S): Deltasone, Predone, Sterapred, Sterapred DS What should I tell my health care provider before I take this medicine? They need to know if you have any of these conditions: -Cushing's syndrome -diabetes -glaucoma -heart disease -high blood pressure -infection (especially a virus infection such as chickenpox, cold sores, or herpes) -kidney disease -liver disease -mental illness -myasthenia gravis -osteoporosis -seizures -stomach or intestine problems -thyroid disease -an unusual or allergic reaction to lactose, prednisone, other medicines, foods, dyes, or preservatives -pregnant or trying to get pregnant -breast-feeding How should I use this medicine? Take this medicine by mouth with a glass of water. Follow the directions on the prescription label. Take this medicine with food. If you are taking this medicine once a day, take it in the morning. Do not take more medicine than you are told to take. Do not suddenly stop taking your  medicine because you may develop a severe reaction. Your doctor will tell you how much medicine to take. If your doctor wants you to stop the medicine, the dose may be slowly lowered over time to avoid any side effects. Talk to your pediatrician regarding the use of this medicine in children. Special care may be needed. Overdosage: If you think you have taken too much of this medicine contact a poison control center or emergency room at once. NOTE: This medicine is only for you. Do not share this medicine with others. What if I miss a dose? If you miss a dose, take it as soon as you can. If it is almost time for your next dose, talk to your doctor or health care professional. You may need to miss a dose or take an extra dose. Do not take double or extra doses without advice. What may interact with this medicine? Do not take this medicine with any of the following medications: -metyrapone -mifepristone This medicine may also interact with the following medications: -aminoglutethimide -amphotericin B -aspirin and aspirin-like medicines -barbiturates -certain medicines for diabetes, like glipizide or glyburide -cholestyramine -cholinesterase inhibitors -cyclosporine -digoxin -diuretics -ephedrine -female hormones, like estrogens and birth control pills -isoniazid -ketoconazole -NSAIDS, medicines for pain and inflammation, like ibuprofen or naproxen -phenytoin -rifampin -toxoids -vaccines -warfarin This list may not describe all possible interactions. Give your health care provider a list of all the medicines, herbs, non-prescription drugs, or dietary supplements you use. Also tell them if you smoke, drink alcohol, or use illegal drugs. Some items may interact with your medicine. What should I watch for while using this medicine?  Visit your doctor or health care professional for regular checks on your progress. If you are taking this medicine over a prolonged period, carry an  identification card with your name and address, the type and dose of your medicine, and your doctor's name and address. This medicine may increase your risk of getting an infection. Tell your doctor or health care professional if you are around anyone with measles or chickenpox, or if you develop sores or blisters that do not heal properly. If you are going to have surgery, tell your doctor or health care professional that you have taken this medicine within the last twelve months. Ask your doctor or health care professional about your diet. You may need to lower the amount of salt you eat. This medicine may affect blood sugar levels. If you have diabetes, check with your doctor or health care professional before you change your diet or the dose of your diabetic medicine. What side effects may I notice from receiving this medicine? Side effects that you should report to your doctor or health care professional as soon as possible: -allergic reactions like skin rash, itching or hives, swelling of the face, lips, or tongue -changes in emotions or moods -changes in vision -depressed mood -eye pain -fever or chills, cough, sore throat, pain or difficulty passing urine -increased thirst -swelling of ankles, feet Side effects that usually do not require medical attention (report to your doctor or health care professional if they continue or are bothersome): -confusion, excitement, restlessness -headache -nausea, vomiting -skin problems, acne, thin and shiny skin -trouble sleeping -weight gain This list may not describe all possible side effects. Call your doctor for medical advice about side effects. You may report side effects to FDA at 1-800-FDA-1088. Where should I keep my medicine? Keep out of the reach of children. Store at room temperature between 15 and 30 degrees C (59 and 86 degrees F). Protect from light. Keep container tightly closed. Throw away any unused medicine after the expiration  date. NOTE: This sheet is a summary. It may not cover all possible information. If you have questions about this medicine, talk to your doctor, pharmacist, or health care provider.  2018 Elsevier/Gold Standard (2011-05-18 10:57:14)   IF you received an x-ray today, you will receive an invoice from [facility]. Please contact [facility] at [phone] with questions or concerns regarding your invoice.   IF you received labwork today, you will receive an invoice from [place]. Please contact [facility] at [phone] with questions or concerns regarding your invoice.   Our billing staff will not be able to assist you with questions regarding bills from these companies.  You will be contacted with the lab results as soon as they are available. The fastest way to get your results is to activate your My Chart account. Instructions are located on the last page of this paperwork. If you have not heard from [place] regarding the results in 2 weeks, please contact this office.

## 2017-12-21 NOTE — Progress Notes (Signed)
   Holly Vazquez  MRN: [id number] DOB: 12-26-1980  PCP: Patient, No Pcp Per  Subjective:  Pt is a pleasant 37 year old female PMH seasonal and contact allergies who presents to clinic for possible insect bites.  She got back from [place] 4 days ago. She was bit by mosquitoes 5 days ago on her right shoulder and right leg. Bites are improving however still c/o really bad itching and it seems to be activating her other allergies. Symptoms are worse when she is at home.   She is taking benadryl every night. Benadryl makes her very sleepy all day. She does feel a little better after she takes it.  ROS below.   Review of Systems  Constitutional: Negative for chills, diaphoresis, fatigue and fever.  Respiratory: Negative for cough, shortness of breath and wheezing.   Gastrointestinal: Negative for abdominal pain, nausea and vomiting.  Skin: Positive for wound.  Neurological: Negative for headaches.    Patient Active Problem List   Diagnosis Date Noted  . 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 Allergic rhinitis 07/26/2015  . Anemia 07/06/2011    Current Outpatient Medications on File Prior to Visit  Medication Sig Dispense Refill  . Cyanocobalamin (VITAMIN B-12 PO) Take 1 tablet by mouth daily.    . ferrous sulfate 325 (65 FE) MG tablet Take 1 tablet (325 mg total) by mouth every other day. Take with food 30 tablet 0  . IRON PO Take 1 tablet by mouth daily.     No current facility-administered medications on file prior to visit.     Allergies  Allergen Reactions  . Flagyl [Metronidazole] Hives  . Latex Hives  . Nickel Dermatitis    trigger  . Penicillins Rash     Objective:  BP 109/71   Pulse 66   Temp 98.2 F (36.8 C) (Oral)   Resp 17   Ht 5\' 3"  (1.6 m)   Wt 144 lb (65.3 kg)   SpO2 98%   BMI 25.51 kg/m   Physical Exam  Constitutional: She is oriented to person, place, and time and well-developed, well-nourished, and in no distress. No distress.  Cardiovascular:  Normal rate, regular rhythm and normal heart sounds.  Neurological: She is alert and oriented to person, place, and time. GCS score is 15.  Skin: Skin is warm and dry.     Multiple <1cm maculopapular lesions right shoulder and thigh. No excoriations, erythema, drainage, warmth.   Psychiatric: Mood, memory, affect and judgment normal.  Vitals reviewed.   Assessment and Plan :  1. Allergic dermatitis 2. Itching 3. Mosquito bite, initial encounter - predniSONE (DELTASONE) 20 MG tablet; Take 3 PO QAM x2days, 2 PO QAM x2days, 1 PO QAM x2days  Dispense: 18 tablet; Refill: 0 - hydrOXYzine (ATARAX/VISTARIL) 25 MG tablet; Take 1 tablet (25 mg total) by mouth every 8 (eight) hours as needed for itching.  Dispense: 30 tablet; Refill: 0 - Pt presents with itching and allergic reaction following several mosquito bites. No anaphylaxis concerns. No signs of secondary bacterial infection. Plan to treat with short prednisone taper. RTC if no improvement.    Mercer Pod, PA-C  [facility] at [place] 12/21/2017 10:28 AM

## 2018-01-08 ENCOUNTER — Ambulatory Visit: Payer: Self-pay | Admitting: *Deleted

## 2018-01-08 NOTE — Telephone Encounter (Signed)
Pt  Still having  Symptoms  From  The  Itching . Instructions  Were  To  followup  If  Not  Better . Pt denies  Any  Angioedema  Or  resp  Symptoms .   [place]  Made  For  tomorrow  With  [facility] 1200    Answer Assessment - Initial Assessment Questions 1. SYMPTOMS: "Do you have any symptoms?"     Itching  Severe      2. SEVERITY: If symptoms are present, ask "Are they mild, moderate or severe?"    Severe  At  Times    Seen  8   March    At  Clinic   Started  Prednisone    On  March    15   Pt  Took    What  Was  rx  But  Still  Has  6  Pills  Left   Taking  Benadryl   No  Severe  Distress    No  Rash  Or   Swelling  Symptoms  Not  Getting  Better  Protocols used: MEDICATION QUESTION CALL-A-AH

## 2018-01-09 ENCOUNTER — Ambulatory Visit: Payer: Self-pay | Admitting: Physician Assistant

## 2018-01-10 ENCOUNTER — Ambulatory Visit (INDEPENDENT_AMBULATORY_CARE_PROVIDER_SITE_OTHER): Payer: Managed Care, Other (non HMO) | Admitting: Physician Assistant

## 2018-01-10 ENCOUNTER — Encounter: Payer: Self-pay | Admitting: Physician Assistant

## 2018-01-10 VITALS — BP 111/72 | HR 72 | Temp 98.1°F | Resp 16 | Ht 63.0 in | Wt 144.0 lb

## 2018-01-10 DIAGNOSIS — L500 Allergic urticaria: Secondary | ICD-10-CM | POA: Diagnosis not present

## 2018-01-10 DIAGNOSIS — L282 Other prurigo: Secondary | ICD-10-CM | POA: Diagnosis not present

## 2018-01-10 MED ORDER — PREDNISONE 20 MG PO TABS: ORAL_TABLET | ORAL | 0 refills | Status: DC

## 2018-01-10 MED ORDER — RANITIDINE HCL 150 MG PO TABS: 150.0000 mg | ORAL_TABLET | Freq: Two times a day (BID) | ORAL | 0 refills | Status: DC

## 2018-01-10 MED ORDER — TRIAMCINOLONE ACETONIDE 0.1 % EX CREA: 1.0000 "application " | TOPICAL_CREAM | Freq: Two times a day (BID) | CUTANEOUS | 0 refills | Status: DC

## 2018-01-10 NOTE — Patient Instructions (Addendum)
Rash A rash is a change in the color of the skin. A rash can also change the way your skin feels. There are many different conditions and factors that can cause a rash. Follow these instructions at home: Pay attention to any changes in your symptoms. Follow these instructions to help with your condition: Medicine Take or apply over-the-counter and prescription medicines only as told by your doctor. These may include:  Corticosteroid cream.  Anti-itch lotions.  Oral antihistamines.  Skin Care  Put cool compresses on the affected areas.  Try taking a bath with: ? Epsom salts. Follow the instructions on the packaging. You can get these at your local pharmacy or grocery store. ? Baking soda. Pour a small amount into the bath as told by your doctor. ? Colloidal oatmeal. Follow the instructions on the packaging. You can get this at your local pharmacy or grocery store.  Try putting baking soda paste onto your skin. Stir water into baking soda until it gets like a paste.  Do not scratch or rub your skin.  Avoid covering the rash. Make sure the rash is exposed to air as much as possible. General instructions  Avoid hot showers or baths, which can make itching worse. A cold shower may help.  Avoid scented soaps, detergents, and perfumes. Use gentle soaps, detergents, perfumes, and other cosmetic products.  Avoid anything that causes your rash. Keep a journal to help track what causes your rash. Write down: ? What you eat. ? What cosmetic products you use. ? What you drink. ? What you wear. This includes jewelry.  Keep all follow-up visits as told by your doctor. This is important. Contact a doctor if:  You sweat at night.  You lose weight.  You pee (urinate) more than normal.  You feel weak.  You throw up (vomit).  Your skin or the whites of your eyes look yellow (jaundice).  Your skin: ? Tingles. ? Is numb.  Your rash: ? Does not go away after a few  days. ? Gets worse.  You are: ? More thirsty than normal. ? More tired than normal.  You have: ? New symptoms. ? Pain in your belly (abdomen). ? A fever. ? Watery poop (diarrhea). Get help right away if:  Your rash covers all or most of your body. The rash may or may not be painful.  You have blisters that: ? Are on top of the rash. ? Grow larger. ? Grow together. ? Are painful. ? Are inside your nose or mouth.  You have a rash that: ? Looks like purple pinprick-sized spots all over your body. ? Has a "bull's eye" or looks like a target. ? Is red and painful, causes your skin to peel, and is not from being in the sun too long. This information is not intended to replace advice given to you by your health care provider. Make sure you discuss any questions you have with your health care provider. Document Released: 03/20/2008 Document Revised: 03/09/2016 Document Reviewed: 02/17/2015 Elsevier Interactive Patient Education  2018 [employer].    IF you received an x-ray today, you will receive an invoice from [facility]. Please contact [facility] at [phone] with questions or concerns regarding your invoice.   IF you received labwork today, you will receive an invoice from [facility]. Please contact [facility] at [phone] with questions or concerns regarding your invoice.   Our billing staff will not be able to assist you with questions regarding bills from these  companies.  You will be contacted with the lab results as soon as they are available. The fastest way to get your results is to activate your My Chart account. Instructions are located on the last page of this paperwork. If you have not heard from [place] regarding the results in 2 weeks, please contact this office.

## 2018-01-10 NOTE — Progress Notes (Signed)
PRIMARY CARE AT [place], [place] [address] [phone]  Date:  01/10/2018   Name:  Holly Vazquez   DOB:  08-Oct-1981   MRN:  [id number]  PCP:  Patient, No Pcp Per    History of Present Illness:  Holly Vazquez is a 37 y.o. female patient who presents to PCP with  Chief Complaint  Patient presents with  . Pruritis    follow up   Patient is here for follow-up of rash.  She was seen about 3 weeks ago after she had 5 days of this pruritic rash.  Given prednisone and hydroxyzine after multiple insect bites.  She had great improvement with the prednisone however once this taper had ended the pruritic character of the rash started again.  This occurs when she is lying on the carpet for around dust.  She denies fever.  There is no shortness of breath or trouble breathing.  She has no rash at this time.  She has a history of allergic urticaria.     Patient Active Problem List   Diagnosis Date Noted  . 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 Allergic rhinitis 07/26/2015  . Anemia 07/06/2011    Past Medical History:  Diagnosis Date  . Absence of menstruation   . Anemia    due to heavy menses, on iron  . Anxiety   . Contact dermatitis and other eczema, due to unspecified cause   . Depression   . Heart murmur    States always told it was from anemia  . Restless legs syndrome (RLS)   . Urinary tract infection, site not specified     Past Surgical History:  Procedure Laterality Date  . WISDOM TOOTH EXTRACTION      Social History   Tobacco Use  . Smoking status: Never Smoker  . Smokeless tobacco: Never Used  Substance Use Topics  . Alcohol use: No  . Drug use: No    Family History  Problem Relation Age of Onset  . Hypertension Father   . Heart disease Maternal Grandmother   . Stroke Maternal Grandmother   . Hypertension Maternal Grandmother   . Eczema Son   . Eczema Son   . Eczema Son     Allergies  Allergen Reactions  . Flagyl [Metronidazole]  Hives  . Latex Hives  . Nickel Dermatitis    trigger  . Penicillins Rash    Medication list has been reviewed and updated.  Current Outpatient Medications on File Prior to Visit  Medication Sig Dispense Refill  . Cyanocobalamin (VITAMIN B-12 PO) Take 1 tablet by mouth daily.    . ferrous sulfate 325 (65 FE) MG tablet Take 1 tablet (325 mg total) by mouth every other day. Take with food 30 tablet 0  . hydrOXYzine (ATARAX/VISTARIL) 25 MG tablet Take 1 tablet (25 mg total) by mouth every 8 (eight) hours as needed for itching. 30 tablet 0  . IRON PO Take 1 tablet by mouth daily.    . predniSONE (DELTASONE) 20 MG tablet Take 3 PO QAM x2days, 2 PO QAM x2days, 1 PO QAM x2days 18 tablet 0   No current facility-administered medications on file prior to visit.     ROS ROS otherwise unremarkable unless listed above.  Physical Examination: BP 111/72   Pulse 72   Temp 98.1 F (36.7 C) (Oral)   Resp 16   Ht 5\' 3"  (1.6 m)   Wt 144 lb (65.3 kg)   SpO2 99%   BMI 25.51  kg/m  Ideal Body Weight: Weight in (lb) to have BMI = 25: 140.8  Physical Exam  Constitutional: She is oriented to person, place, and time. She appears well-developed and well-nourished. No distress.  HENT:  Head: Normocephalic and atraumatic.  Right Ear: External ear normal.  Left Ear: External ear normal.  Eyes: Pupils are equal, round, and reactive to light. Conjunctivae and EOM are normal.  Cardiovascular: Normal rate.  Pulmonary/Chest: Effort normal. No respiratory distress.  Neurological: She is alert and oriented to person, place, and time.  Skin: She is not diaphoretic.  Psychiatric: She has a normal mood and affect. Her behavior is normal.     Assessment and Plan: Holly Vazquez is a 37 y.o. female who is here today for cc of  Chief Complaint  Patient presents with  . Pruritis    follow up  I am giving her an H2 blocker at this time.  I will also do a quick taper over 6-day.. I am also given her   topical steroid cream at this time.  She will return in 2 weeks if her symptoms do not improve.  I have advised her to continue using the Claritin. Pruritic rash - Plan: predniSONE (DELTASONE) 20 MG tablet, ranitidine (ZANTAC) 150 MG tablet, triamcinolone cream (KENALOG) 0.1 %  Allergic urticaria - Plan: predniSONE (DELTASONE) 20 MG tablet, ranitidine (ZANTAC) 150 MG tablet, triamcinolone cream (KENALOG) 0.1 %  Ivar Drape, PA-C [facility] 3/28/201910:36 AM  There are no diagnoses linked to this encounter.  Ivar Drape, PA-C [facility] 01/10/2018 9:14 AM

## 2018-01-23 ENCOUNTER — Encounter: Payer: Self-pay | Admitting: Physician Assistant

## 2018-03-20 ENCOUNTER — Other Ambulatory Visit (HOSPITAL_COMMUNITY)
Admission: RE | Admit: 2018-03-20 | Discharge: 2018-03-20 | Disposition: A | Discharge status: Home / Self Care | Payer: Managed Care, Other (non HMO) | Source: Ambulatory Visit | Attending: Nurse Practitioner | Admitting: Nurse Practitioner

## 2018-03-20 ENCOUNTER — Encounter: Payer: Self-pay | Admitting: Nurse Practitioner

## 2018-03-20 ENCOUNTER — Ambulatory Visit (INDEPENDENT_AMBULATORY_CARE_PROVIDER_SITE_OTHER): Payer: Managed Care, Other (non HMO) | Admitting: Nurse Practitioner

## 2018-03-20 VITALS — BP 122/67 | HR 67 | Ht 64.0 in | Wt 149.2 lb

## 2018-03-20 DIAGNOSIS — N841 Polyp of cervix uteri: Secondary | ICD-10-CM

## 2018-03-20 DIAGNOSIS — R102 Pelvic and perineal pain: Secondary | ICD-10-CM | POA: Insufficient documentation

## 2018-03-20 DIAGNOSIS — N898 Other specified noninflammatory disorders of vagina: Secondary | ICD-10-CM | POA: Diagnosis not present

## 2018-03-20 HISTORY — DX: Polyp of cervix uteri: N84.1

## 2018-03-20 MED ORDER — IBUPROFEN 200 MG PO TABS
600.0000 mg | ORAL_TABLET | Freq: Once | ORAL | Status: AC
  Administered 2018-03-20: 600 mg via ORAL

## 2018-03-20 NOTE — Progress Notes (Signed)
C/o when she ovulates she feels cramps and after sex is painful sometimes.    Given ibuprofen 600mg  po x1 post polyp removal for pain=2.

## 2018-03-20 NOTE — Progress Notes (Signed)
GYNECOLOGY OFFICE VISIT NOTE   History:  37 y.o. [id number] here today for pelvic pain and vaginal discharge.  She is noticing pelvic pain and cramping at ovulation, at menses and during sex.  She has always had some cramping but it is worse now for the past few months.  With the vaginal discharge now, she is worried that something is wrong.  Has menses for 5 days and uses super plus tampons for her bleeding.  Takes Aleve for her cramping and that usually helps.   Has noticed lots more lower abdominal pain all across her lower abdomen.  No dysuria.  Has BMs every other day or daily.  Her partner has had a vasectomy and she only used the patch once when she was much younger.  She reports being very sensitive to the hormone fluctuations and would prefer not to use any hormones if possible.  Past Medical History:  Diagnosis Date  . Absence of menstruation   . Anemia    due to heavy menses, on iron  . Anxiety   . Contact dermatitis and other eczema, due to unspecified cause   . Depression   . Fibroids   . Heart murmur    States always told it was from anemia  . Restless legs syndrome (RLS)   . Urinary tract infection, site not specified     Past Surgical History:  Procedure Laterality Date  . WISDOM TOOTH EXTRACTION      The following portions of the patient's history were reviewed and updated as appropriate: allergies, current medications, past family history, past medical history, past social history, past surgical history and problem list.   Health Maintenance:  Normal pap and negative HRHPV on 06-2015.  Not yet 42 so mammogram is not indicated.  Review of Systems:  Pertinent items noted in HPI and remainder of comprehensive ROS otherwise negative.  Objective:  Physical Exam BP 122/67   Pulse 67   Ht 5\' 4"  (1.626 m)   Wt 149 lb 3.2 oz (67.7 kg)   LMP 02/26/2018 (Approximate)   BMI 25.61 kg/m  CONSTITUTIONAL: Well-developed, well-nourished female in no acute distress.  HENT:   Normocephalic, atraumatic. External right and left ear normal.  EYES: Conjunctivae and EOM are normal. Pupils are equal, round. No scleral icterus.  NECK: Normal range of motion, supple, no masses SKIN: Skin is warm and dry. No rash noted. Not diaphoretic. No erythema. No pallor. NEUROLOGIC: Alert and oriented to person, place, and time. Normal muscle tone coordination. No cranial nerve deficit noted. PSYCHIATRIC: Normal mood and affect. Normal behavior. Normal judgment and thought content. CARDIOVASCULAR: Normal heart rate noted RESPIRATORY: Effort and breath sounds normal, no problems with respiration noted ABDOMEN: Soft, no distention noted.  No mass palpated.  No pain with palpation of abdomen in any quadrant. PELVIC: Normal appearing vulva and vaginal mucosa.  Small amount of pale yellow vaginal discharge noted at the introitus and in the vagina.  Long cervical polyp identified.  Dr. Ihor Dow came in and remvoved the polyp and applied silver nitrate inside the cervical os.  No bleeding seen.  MUSCULOSKELETAL: Normal range of motion. No edema noted.  Labs and Imaging GC, Chlam, and wet prep done. Cervical polyp sent to pathology.  Assessment & Plan:  Pelvic pain Vaginal discharge Cervical polyp  Plan No sex for one week Expect some vaginal bleeding and perhaps some black vaginal discharge from silver nitrate application. Return in September 2019 for annual exam and pap. Client declines any  prescription for pills today and prefers to reevaluated her pelvic pain now that the cervical polyp has been identified and removed.  Routine preventative health maintenance measures emphasized. Please refer to After Visit Summary for other counseling recommendations.   Total face-to-face time with patient: 25 minutes.  Over 50% of encounter was spent on counseling and coordination of care.  Earlie Server, RN, MSN, NP-BC Nurse Practitioner, [facility] for [employer], [place] [facility] 03/20/2018 10:02 AM

## 2018-03-20 NOTE — Patient Instructions (Signed)
Return in September for full physical.

## 2018-03-21 LAB — CERVICOVAGINAL ANCILLARY ONLY
BACTERIAL VAGINITIS: NEGATIVE
CANDIDA VAGINITIS: NEGATIVE
Chlamydia: NEGATIVE
NEISSERIA GONORRHEA: NEGATIVE
TRICH (WINDOWPATH): NEGATIVE

## 2018-05-08 ENCOUNTER — Encounter: Payer: Self-pay | Admitting: Physician Assistant

## 2018-05-08 ENCOUNTER — Ambulatory Visit: Payer: Managed Care, Other (non HMO) | Admitting: Physician Assistant

## 2018-05-08 ENCOUNTER — Other Ambulatory Visit: Payer: Self-pay

## 2018-05-08 VITALS — BP 108/69 | HR 63 | Temp 99.2°F | Resp 16 | Ht 63.5 in | Wt 148.4 lb

## 2018-05-08 DIAGNOSIS — L739 Follicular disorder, unspecified: Secondary | ICD-10-CM

## 2018-05-08 MED ORDER — MUPIROCIN 2 % EX OINT: 1.0000 "application " | TOPICAL_OINTMENT | Freq: Three times a day (TID) | CUTANEOUS | 1 refills | Status: DC

## 2018-05-08 NOTE — Patient Instructions (Addendum)
Apply mupirocin ointment (BACTROBAN) 2 %; 1 application topically 3 (three) times daily. Do not shave until you are fully healed.  Give it about 2 weeks before you try your cream   Folliculitis Folliculitis is inflammation of the hair follicles. Folliculitis most commonly occurs on the scalp, thighs, legs, back, and buttocks. However, it can occur anywhere on the body. What are the causes? This condition may be caused by:  A bacterial infection (common).  A fungal infection.  A viral infection.  Coming into contact with certain chemicals, especially oils and tars.  Shaving or waxing.  Applying greasy ointments or creams to your skin often.  Long-lasting folliculitis and folliculitis that keeps coming back can be caused by bacteria that live in the nostrils. What increases the risk? This condition is more likely to develop in people with:  A weakened immune system.  Diabetes.  Obesity.  What are the signs or symptoms? Symptoms of this condition include:  Redness.  Soreness.  Swelling.  Itching.  Small white or yellow, pus-filled, itchy spots (pustules) that appear over a reddened area. If there is an infection that goes deep into the follicle, these may develop into a boil (furuncle).  A group of closely packed boils (carbuncle). These tend to form in hairy, sweaty areas of the body.  How is this diagnosed? This condition is diagnosed with a skin exam. To find what is causing the condition, your health care provider may take a sample of one of the pustules or boils for testing. How is this treated? This condition may be treated by:  Applying warm compresses to the affected areas.  Taking an antibiotic medicine or applying an antibiotic medicine to the skin.  Applying or bathing with an antiseptic solution.  Taking an over-the-counter medicine to help with itching.  Having a procedure to drain any pustules or boils. This may be done if a pustule or boil  contains a lot of pus or fluid.  Laser hair removal. This may be done to treat long-lasting folliculitis.  Follow these instructions at home:  If directed, apply heat to the affected area as often as told by your health care provider. Use the heat source that your health care provider recommends, such as a moist heat pack or a heating pad. ? Place a towel between your skin and the heat source. ? Leave the heat on for 20-30 minutes. ? Remove the heat if your skin turns bright red. This is especially important if you are unable to feel pain, heat, or cold. You may have a greater risk of getting burned.  If you were prescribed an antibiotic medicine, use it as told by your health care provider. Do not stop using the antibiotic even if you start to feel better.  Take over-the-counter and prescription medicines only as told by your health care provider.  Do not shave irritated skin.  Keep all follow-up visits as told by your health care provider. This is important. Get help right away if:  You have more redness, swelling, or pain in the affected area.  Red streaks are spreading from the affected area.  You have a fever. This information is not intended to replace advice given to you by your health care provider. Make sure you discuss any questions you have with your health care provider. Document Released: 12/11/2001 Document Revised: 04/21/2016 Document Reviewed: 07/23/2015 Elsevier Interactive Patient Education  2018 [employer].  IF you received an x-ray today, you will receive an invoice from [facility]. Please contact [facility] at [phone] with questions or concerns regarding your invoice.   IF you received labwork today, you will receive an invoice from [employer]. Please contact [facility] at [phone] with questions or concerns regarding your invoice.   Our billing staff will not be able to assist you with questions regarding bills from these  companies.  You will be contacted with the lab results as soon as they are available. The fastest way to get your results is to activate your My Chart account. Instructions are located on the last page of this paperwork. If you have not heard from [place] regarding the results in 2 weeks, please contact this office.

## 2018-05-08 NOTE — Progress Notes (Signed)
   Holly Vazquez  MRN: [id number] DOB: [birth year]  PCP: Patient, No Pcp Per  Subjective:  Pt is a 37 year old female who presents to clinic for rash.  She received her first bikini waxing 2 weeks ago. About 1 week ago she noticed bumps with white spots of the area. Endorses itching. She put a cream on it which she got from [place] waxing store. This is not helping.  Denies new sexual contact.   Review of Systems  Genitourinary: Negative for genital sores, vaginal bleeding, vaginal discharge and vaginal pain.  Skin: Positive for rash.    Patient Active Problem List   Diagnosis Date Noted  . Pelvic pain 03/20/2018  . Cervical polyp 03/20/2018  . 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 Allergic rhinitis 07/26/2015  . Anemia 07/06/2011    Current Outpatient Medications on File Prior to Visit  Medication Sig Dispense Refill  . Cyanocobalamin (VITAMIN B-12 PO) Take 1 tablet by mouth daily.    . hydrOXYzine (ATARAX/VISTARIL) 25 MG tablet Take 1 tablet (25 mg total) by mouth every 8 (eight) hours as needed for itching. 30 tablet 0  . IRON PO Take 1 tablet by mouth daily.    Marland Kitchen loratadine (CLARITIN) 10 MG tablet Take 10 mg by mouth daily.    . ranitidine (ZANTAC) 150 MG tablet Take 1 tablet (150 mg total) by mouth 2 (two) times daily. 30 tablet 0  . triamcinolone cream (KENALOG) 0.1 % Apply 1 application topically 2 (two) times daily. 30 g 0   No current facility-administered medications on file prior to visit.     Allergies  Allergen Reactions  . Flagyl [Metronidazole] Hives  . Latex Hives  . Nickel Dermatitis    trigger  . Penicillins Rash     Objective:  BP 108/69 (BP Location: Left Arm, Patient Position: Sitting, Cuff Size: Normal)   Pulse 63   Temp 99.2 F (37.3 C) (Oral)   Resp 16   Ht 5' 3.5" (1.613 m)   Wt 148 lb 6.4 oz (67.3 kg)   LMP 04/16/2018   SpO2 99%   BMI 25.88 kg/m   Physical Exam  Constitutional: She is oriented to person, place, and time. No distress.   Neurological: She is alert and oriented to person, place, and time.  Skin: Skin is warm and dry. Rash noted. Rash is papular.     Psychiatric: Judgment normal.  Vitals reviewed.   Assessment and Plan :  1. Folliculitis - Pt presents c/o rash after waxing treatment involving bikini area. Suspect infectious folliculitis, plan to cover topically. Hygiene discussed.  - mupirocin ointment (BACTROBAN) 2 %; Apply 1 application topically 3 (three) times daily.  Dispense: 22 g; Refill: 1   Whitney Kellsey Sansone, PA-C  [facility] at [facility] 05/08/2018 12:17 PM

## 2018-08-15 ENCOUNTER — Ambulatory Visit (INDEPENDENT_AMBULATORY_CARE_PROVIDER_SITE_OTHER): Payer: Managed Care, Other (non HMO) | Admitting: *Deleted

## 2018-08-15 DIAGNOSIS — N390 Urinary tract infection, site not specified: Secondary | ICD-10-CM | POA: Diagnosis not present

## 2018-08-15 DIAGNOSIS — R300 Dysuria: Secondary | ICD-10-CM

## 2018-08-15 LAB — POCT URINALYSIS DIP (DEVICE)
BILIRUBIN URINE: NEGATIVE
Glucose, UA: NEGATIVE mg/dL
KETONES UR: NEGATIVE mg/dL
Nitrite: POSITIVE — AB
PH: 6.5 (ref 5.0–8.0)
Protein, ur: NEGATIVE mg/dL
Specific Gravity, Urine: 1.015 (ref 1.005–1.030)
Urobilinogen, UA: 0.2 mg/dL (ref 0.0–1.0)

## 2018-08-15 MED ORDER — PHENAZOPYRIDINE HCL 200 MG PO TABS: 200.0000 mg | ORAL_TABLET | Freq: Three times a day (TID) | ORAL | 0 refills | Status: AC

## 2018-08-15 MED ORDER — SULFAMETHOXAZOLE-TRIMETHOPRIM 800-160 MG PO TABS
1.0000 | ORAL_TABLET | Freq: Two times a day (BID) | ORAL | 0 refills | Status: AC
Start: 2018-08-15 — End: 2018-08-18

## 2018-08-15 NOTE — Progress Notes (Addendum)
Came by office and left urine sample for c/o possible uti. Then left because she came to work. I called her and she  Reports buring with urination and strong odor of urine. States feels like when she had UTI before and she took cipro with relief. I informed her I can send in Cipro and pyridium to her pharmacy and that we will send her urine for culture and if we find out another medication will be better we will call her. She voices understanding.  When I entered her orders notified Cipro is not on preferred formulary. I verified with pharmacy that patient may have Septra DS with her allergies. I called patient and notified her I am sending in [place] DS instead because of her insurance and that I had verified with her insurance Septra is ok. She voices understanding.    I have reviewed the labs and note and I agree with the plan of care.  Earlie Server, RN, MSN, NP-BC Nurse Practitioner, [facility] for [employer], [place] [facility] 08/15/2018 2:56 PM

## 2018-08-17 LAB — URINE CULTURE

## 2020-03-22 ENCOUNTER — Other Ambulatory Visit: Payer: Self-pay

## 2020-03-22 ENCOUNTER — Encounter: Payer: Self-pay | Admitting: *Deleted

## 2020-03-22 ENCOUNTER — Ambulatory Visit (INDEPENDENT_AMBULATORY_CARE_PROVIDER_SITE_OTHER): Payer: Medicaid Other | Admitting: Clinical

## 2020-03-22 ENCOUNTER — Other Ambulatory Visit (HOSPITAL_COMMUNITY)
Admission: RE | Admit: 2020-03-22 | Discharge: 2020-03-22 | Disposition: A | Discharge status: Home / Self Care | Payer: Medicaid Other | Source: Ambulatory Visit | Attending: Family Medicine | Admitting: Family Medicine

## 2020-03-22 ENCOUNTER — Ambulatory Visit (INDEPENDENT_AMBULATORY_CARE_PROVIDER_SITE_OTHER): Payer: Medicaid Other | Admitting: *Deleted

## 2020-03-22 VITALS — BP 132/90 | HR 76 | Temp 97.8°F | Ht 64.0 in | Wt 149.4 lb

## 2020-03-22 DIAGNOSIS — F3281 Premenstrual dysphoric disorder: Secondary | ICD-10-CM | POA: Diagnosis not present

## 2020-03-22 DIAGNOSIS — N898 Other specified noninflammatory disorders of vagina: Secondary | ICD-10-CM

## 2020-03-22 DIAGNOSIS — R300 Dysuria: Secondary | ICD-10-CM

## 2020-03-22 LAB — POCT URINALYSIS DIP (DEVICE)
Glucose, UA: NEGATIVE mg/dL
Ketones, ur: 15 mg/dL — AB
Leukocytes,Ua: NEGATIVE
Nitrite: POSITIVE — AB
Protein, ur: 30 mg/dL — AB
Specific Gravity, Urine: >=1.03 (ref 1.005–1.030)
Urobilinogen, UA: 0.2 mg/dL (ref 0.0–1.0)
pH: 5.5 (ref 5.0–8.0)

## 2020-03-22 NOTE — Patient Instructions (Signed)
/Emotional Wellbeing Pharmacist, hospital Here are a few free apps meant to help you to help yourself.  To find, try searching on the internet to see if the app is offered on Apple/Android devices. If your first choice doesn't come up on your device, the good news is that there are many choices! Play around with different apps to see which ones are helpful to you.    Calm This is an app meant to help increase calm feelings. Includes info, strategies, and tools for tracking your feelings.      Calm Harm  This app is meant to help with self-harm. Provides many 5-minute or 15-min coping strategies for doing instead of hurting yourself.       Venango is a problem-solving tool to help deal with emotions and cope with stress you encounter wherever you are.      MindShift This app can help people cope with anxiety. Rather than trying to avoid anxiety, you can make an important shift and face it.      MY3  MY3 features a support system, safety plan and resources with the goal of offering a tool to use in a time of need.       My Life My Voice  This mood journal offers a simple solution for tracking your thoughts, feelings and moods. Animated emoticons can help identify your mood.       Relax Melodies Designed to help with sleep, on this app you can mix sounds and meditations for relaxation.      Smiling Mind Smiling Mind is meditation made easy: it's a simple tool that helps put a smile on your mind.        Stop, Breathe & Think  A friendly, simple guide for people through meditations for mindfulness and compassion.  Stop, Breathe and Think Kids Enter your current feelings and choose a "mission" to help you cope. Offers videos for certain moods instead of just sound recordings.       Team Orange The goal of this tool is to help teens change how they think, act, and react. This app helps you focus on your own good feelings and experiences.      The [place] Box The [place] Box (VHB) contains simple tools to help patients with coping, relaxation, distraction, and positive thinking.    Behavioral Health Resources:   What if I or someone I know is in crisis?  . If you are thinking about harming yourself or having thoughts of suicide, or if you know someone who is, seek help right away.  . Call your doctor or mental health care provider.  . Call 911 or go to a hospital emergency room to get immediate help, or ask a friend or family member to help you do these things.  . Call the [place] National Suicide Prevention Lifeline's toll-free, 24-hour hotline at 1-800-273-TALK 737-230-6108) or TTY: 1-800-799-4 TTY 6716635835) to talk to a trained counselor.  . If you are in crisis, make sure you are not left alone.   . If someone else is in crisis, make sure he or she is not left alone   24 Hour :   [place] National Suicide Hotline: 616-758-3955  Therapeutic Alternative Mobile Crisis: (914) 676-7383   [facility]  [address] [place], [place] [address]  [phone] or [phone]  [facility] [employer] (Domestic Violence, Rape & Victim Assistance)  [phone]  [place]  [place], [place]  [address]   [phone] or [phone]   [place]: [phone] (8am-4pm) or [phone] (after hours)           [facility], [address] [place], [place] Fax: [phone] www.[email or web address]  *Interpreters available *Accepts Medicaid, Medicare, uninsured  [facility]   Mon-Fri: 8am-5pm [address], [place], [place] [phone](phone); [phone]) BloggerCourse.com  *Accepts Medicare  [facility] [place], Fri: 8am-4pm [address] [place], [place] (phone); [phone] (fax) TaskTown.es  *[place] Mon-Fri: 9am-5pm  [address] [place], [place] (phone); [phone]  [email or web address]  *Accepts Medicaid  [facility] [address] [place], [place]  SalonLookup.es   [facility] [place], 8:30am-12pm/1pm-2:30pm [address], [place], [place] (phone); [phone] (fax) www.[email or web address]  *Accepts Medicaid, sliding-scale*Bilingual services available  [facility] Mon-Fri, 8am-7pm [place], [place]  [phone](phone); [phone]) www.[email or web address]  *Accepts Medicaid *Bilingual services available  [facility] Mon-Fri: 8am-5pm, Saturday by appointment only [place], [place], [place] (phone); [phone] (fax) www.[email or web address]   [facility] [address] [place], [place], [place] [email or web address]  *Free & reduced services for uninsured and underinsured individuals *Bilingual services for Spanish-speaking clients 21 and under  [facility], [address] [place], [place]); [phone]) RunningConvention.de  *Bring your own interpreter at first visit *Accepts Medicare and [facility]  [place] Mon-Fri: 9am-5:30pm [address] [place], [place], [place] (phone), [phone] (fax) After hours crisis line: 9711965296 www.[email or web address]  *Accepts Medicare and Medicaid  [facility], 8am-6pm [address] [place], [place] (phone); [phone] (fax) http://[email or web address]    *Subsidized costs available  [facility] Mon-Fri: 8am-4pm [address], [place], [place] [phone](phone); [phone]) www.psychotherapeuticservices.com  *Accepts Medicaid  [facility] Same day access hours: Mon-Fri, 8:30-3pm Crisis hours: Mon-Fri, 8am-5pm [place], [facility] Same day access hours: Mon-Fri, 8:30-3pm Crisis hours: Mon-Fri, 8am-8pm [address], [place], [place] (phone); [phone] (fax) www.[email or web address]  *Accepts Medicaid and Medicare  [facility], [place], Fri: 9am-9pm Tues, Thurs: 9am-6pm [place], [place], [place] (phone); [phone] (fax) https://[email or web address]  *(Accepts Medicare and Medicaid; payment plans available)*Bilingual services available  [facility] [address] [place], [place] (phone); [phone] (fax) [email or web address] [address] [place], [place], [place]  [email or web address]  *Bilingual services available  [facility]otional [facility]) Mon-Thurs: 8am-8pm [address], [place], [place], [place] (phone); [phone] (fax) LostMillions.[email or web address]  *Accepts Medicaid*Bilingual services available  [place] [address] [place], [place], [place] (phone) DeadConnect.[email or web address]  *Accepts Medicaid *Bilingual services available  [facility] Mon-Fri, 9am-4:45pm [address] [place], [place] (phone); [phone] (fax) http://[email or web address]-[email or web address]  *Accepts Medicare  [facility] Mon-Thurs: 8:30-8pm, Fri: 8:30am-7pm [address] [place], [place] (3rd floor) [phone] (phone); [phone] (fax) VIPinterview.[email or web address]  *Accepts Medicaid; income-based  reduced rates available  [facility] Mon-Fri: 8am-5pm [address] [place], [place], [place] (phone); [phone] (fax) http://[email or web address]  *Accepts Medicaid*Bilingual services available  [place], [place], [place], [place] (phone); [phone] (fax) www.[email or web address]  *Free emergency housing and clinical services for  youth in crisis  [facility] ([place])  [address],  [phone] www.[email or web address]  *Provides direct services to individuals in recovery from mental illness, including support groups, recovery skills classes, and one on one peer support  NAMI [employer] on [address]) [place] helpline: 330-743-8143  https://namiguilford.org  *A community hub for information relating to local resources and services for the friends and families of individuals living alongside a mental health condition, as well as the individuals themselves. Classes and support groups also provided

## 2020-03-22 NOTE — BH Specialist Note (Addendum)
Integrated Behavioral Health Initial Visit  MRN: [id number] Name: Holly Vazquez  Number of [place] Clinician visits:: 1/6 Session Start time: 11:50 Session End time: 12:23 Total time: 33  Type of Service: [place] Interpretor:No. Interpretor Name and Language: n/a   Warm Hand Off Completed.       SUBJECTIVE: Holly Vazquez is a 39 y.o. female accompanied by n/a Patient was referred by Loma Boston, MD for positive depression screen. Patient reports the following symptoms/concerns: Pt states her primary concern today is an increase in mood instability, depression, anxiety, and SI (with no intent or plan) the week prior to menstrual cycle, that dissipates after menstruation begins; copes best with time away from everyone at the beach.  Pt is open to learning self-coping strategy today, and would like to talk to medical provider about medication to manage symptoms, before next menstrual.  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 Reduce symptoms of: anxiety, depression and stress 2. Increase knowledge and/or ability of: self-management skills and stress reduction  3. Demonstrate ability to: Increase healthy adjustment to current life circumstances and Increase motivation to adhere to plan of care  INTERVENTIONS: Interventions utilized: Mindfulness or Relaxation Training, Supportive Counseling and Psychoeducation and/or Health Education  Standardized Assessments completed: GAD-7 and PHQ  9  ASSESSMENT: Patient currently experiencing Premenstrual dysphoric disorder.   Patient may benefit from psychoeducation and brief therapeutic interventions regarding coping with symptoms of depression, anxiety and mood instability .  PLAN: 1. Follow up with behavioral health clinician on : Two weeks 2. Behavioral recommendations:  -Follow safety plan, as discussed, if SI returns -CALM relaxation breathing exercise twice daily (morning; evening with ocean sounds) -Continue tracking menstrual cycle on calendar; block off the week prior to next period to prioritize extra self-care activities only  -Consider scheduling beach trip as soon as able  3. Referral(s): Integrated [employer] [facility] (In Clinic)  Caroleen Hamman [place], [place]  Depression screen [facility] 2/9 03/22/2020 05/08/2018 03/20/2018 01/10/2018 12/21/2017  Decreased Interest 3 0 1 0 0  Down, Depressed, Hopeless 3 0 0 0 0  PHQ - 2 Score 6 0 1 0 0  Altered sleeping 3 - 0 - -  Tired, decreased energy 3 - 1 - -  Change in appetite 3 - 1 - -  Feeling bad or failure about yourself  3 - 0 - -  Trouble concentrating 3 - 0 - -  Moving slowly or fidgety/restless 3 - 0 - -  Suicidal thoughts 1 - 0 - -  PHQ-9 Score 25 - 3 - -   GAD 7 : Generalized Anxiety Score 03/22/2020 03/20/2018  Nervous, Anxious, on Edge 3 1  Control/stop worrying 3 0  Worry too much - different things 3 1  Trouble relaxing 3 0  Restless 3 0  Easily annoyed or irritable 3 2  Afraid - awful might happen 3 0  Total GAD 7 Score 21 4

## 2020-03-22 NOTE — Progress Notes (Addendum)
Pt reports burning w/urination x1 month. She also has vaginal itching and irritation x1 month. Urine specimen obtained for UA and culture. Self swab obtained for wet prep. Pt will be notified of test results and treatment needed if any via MyChart. Pt c/o a drastic change in her mental/emotional state. She reports feeling hostile, angry and violent. She further stated this behavior is not normal for her. Pt scored 25 on PHQ-9 and 21 on GAD-7 questionnaire today. I offered [facility] today and she accepted. Pt taken to see Vesta Mixer for face to face visit.   Chart reviewed for nurse visit. Agree with plan of care.   Virginia Rochester, NP 03/22/2020 5:26 PM

## 2020-03-22 NOTE — Addendum Note (Signed)
Addended by: Vesta Mixer C on: 03/22/2020 01:33 PM   Modules accepted: Level of Service

## 2020-03-23 LAB — CERVICOVAGINAL ANCILLARY ONLY
Bacterial Vaginitis (gardnerella): NEGATIVE
Candida Glabrata: NEGATIVE
Candida Vaginitis: NEGATIVE
Chlamydia: NEGATIVE
Comment: NEGATIVE
Comment: NEGATIVE
Comment: NEGATIVE
Comment: NEGATIVE
Comment: NEGATIVE
Comment: NORMAL
Neisseria Gonorrhea: NEGATIVE
Trichomonas: NEGATIVE

## 2020-03-24 ENCOUNTER — Other Ambulatory Visit: Payer: Self-pay | Admitting: Nurse Practitioner

## 2020-03-24 LAB — URINE CULTURE

## 2020-03-24 MED ORDER — SULFAMETHOXAZOLE-TRIMETHOPRIM 800-160 MG PO TABS: 1.0000 | ORAL_TABLET | Freq: Two times a day (BID) | ORAL | 0 refills | Status: DC

## 2020-03-24 NOTE — Progress Notes (Signed)
UTI noted.  Medication sent to client's pharmacy and MyChart note sent to client.

## 2020-04-01 NOTE — BH Specialist Note (Signed)
Pt did not arrive to video visit and a man answered the phone and said "she can't come to the phone right now, can I take a message", so Left HIPPA-compliant message to call back Holly Vazquez from Center for [employer] at [facility] for Women at  [phone] [facility] office) ; left MyChart message for patient.    Integrated Behavioral Health via Telemedicine Video (Caregility) Visit  04/01/2020 Otie Headlee [id number]  Number of [place] visits: 2 Session Start time: 8:34  Session End time: 8:45 Total time: 11  Referring Provider: Loma Boston, MD Type of Visit: Video Patient/Family location: Home [facility] Provider location: [facility] for [place] at [facility]  All persons participating in visit: Patient Siboney Requejo and [place]    Confirmed patient's address: Yes  Confirmed patient's phone number: Yes  Any changes to demographics: No   Confirmed patient's insurance: Yes  Any changes to patient's insurance: No   Discussed confidentiality: at previous visit  I connected with [name]  by a video enabled telemedicine application (Caregility) and verified that I am speaking with the correct person using two identifiers.     I discussed the limitations of evaluation and management by telemedicine and the availability of in person appointments.  I discussed that the purpose of this visit is to provide behavioral health care while limiting exposure to the novel coronavirus.   Discussed there is a possibility of technology failure and discussed alternative modes of communication if that failure occurs.  I discussed that engaging in this virtual visit, they consent to the provision of behavioral healthcare and the services will be billed under their insurance.  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  Duration of problem: over 5 months; Severity of problem: mild  STRENGTHS (Protective Factors/Coping Skills): Supportive family, self-awareness, implementing mindfulness strategies and future oriented outlook  GOALS ADDRESSED: Patient will: 1.  Reduce symptoms of: anxiety, depression and mood instability  2.  Demonstrate ability to: Increase healthy adjustment to current life circumstances  INTERVENTIONS: Interventions utilized:  Supportive Counseling Standardized Assessments completed: GAD-7 and PHQ 9  ASSESSMENT: Patient currently experiencing Premenstrual dysphoric disorder .   Patient may benefit from continued psychoeducation and brief therapeutic interventions regarding maintaining reduction of symptoms of anxiety, depression, and mood instability, related to PMDD .  PLAN: 1. Follow up with behavioral health clinician on : Call Wendee Hata at [phone] if symptoms increase again 2. 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 Referral(s): [place] (In Clinic)  I discussed the assessment and treatment plan with the patient and/or parent/guardian. They were provided an opportunity to ask questions and all were answered. They agreed with the plan and demonstrated an understanding of the instructions.   They were advised to call back or seek an in-person evaluation if the symptoms worsen or if the condition fails to improve as anticipated.  Caroleen Hamman [facility]  Depression screen [facility] 2/9 04/06/2020 03/22/2020 05/08/2018 03/20/2018 01/10/2018  Decreased  Interest 1 3 0 1 0  Down, Depressed, Hopeless 0 3 0 0 0  PHQ - 2 Score 1 6 0 1 0  Altered sleeping 0 3 - 0 -  Tired, decreased energy 0 3 - 1 -  Change in appetite 1 3 - 1 -  Feeling bad or failure about yourself  0 3 - 0 -  Trouble concentrating 1 3 - 0 -  Moving slowly or fidgety/restless 1 3 - 0 -  Suicidal thoughts 0 1 - 0 -  PHQ-9 Score 4 25 - 3 -   GAD 7 : Generalized Anxiety Score 04/06/2020 03/22/2020 03/20/2018  Nervous, Anxious, on Edge 0 3 1  Control/stop worrying 1 3 0  Worry too much - different things 0 3 1  Trouble relaxing 0 3 0  Restless 0 3 0  Easily annoyed or irritable 0 3 2  Afraid - awful might happen 0 3 0  Total GAD 7 Score 1 21 4

## 2020-04-06 ENCOUNTER — Ambulatory Visit (INDEPENDENT_AMBULATORY_CARE_PROVIDER_SITE_OTHER): Payer: Medicaid Other | Admitting: Clinical

## 2020-04-06 DIAGNOSIS — F3281 Premenstrual dysphoric disorder: Secondary | ICD-10-CM

## 2020-04-22 ENCOUNTER — Encounter: Payer: Self-pay | Admitting: Family Medicine

## 2020-04-22 ENCOUNTER — Ambulatory Visit: Payer: Medicaid Other | Admitting: Family Medicine

## 2020-04-22 NOTE — Progress Notes (Signed)
Patient did not keep appointment today. She may call to reschedule.  

## 2020-06-10 NOTE — BH Specialist Note (Signed)
Integrated Behavioral Health via Telemedicine Video (Caregility) Visit  06/10/2020 [place] [id number]  Number of [place] visits: 3 Session Start time: 1:16  Session End time: 1:44 Total time: 28 minutes  Referring Provider: Loma Boston, MD Type of Visit: Video Patient/Family location: Home [facility] Provider location: [facility] for [place] at [facility]  All persons participating in visit: [name] and [place]    Discussed confidentiality: Yes   I connected with Holly Vazquez by a video enabled telemedicine application (Caregility) and verified that I am speaking with the correct person using two identifiers.    I discussed that engaging in this virtual visit, they consent to the provision of behavioral healthcare and the services will be billed under their insurance.   Patient and/or legal guardian expressed understanding and consented to virtual visit: Yes   PRESENTING CONCERNS: Patient and/or family reports the following symptoms/concerns: Pt states her primary concern today is increased mood instability the past two months (week before menstruation only), with menstrual cycle lasting only 3 week/21 day cycle instead of her typical 25-28 day cycle the past two cycles. 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 Pt copes best with marijuana use, and prefers medication to manage symptoms.  Duration of problem: Increase in less than two months; Severity of problem: moderately severe  STRENGTHS (Protective Factors/Coping Skills): Supportive family, self-awareness, implementing mindfulness daily and future oriented outlook  GOALS ADDRESSED: Patient will: 1.  Reduce symptoms of: mood instability and stress 2.  Demonstrate ability to: Increase healthy adjustment to current life circumstances and Decrease self-medicating  behaviors  INTERVENTIONS: Interventions utilized:  Solution-Focused Strategies Standardized Assessments completed: GAD-7 and PHQ 9  ASSESSMENT: Patient currently experiencing Premenstrual dysphoric disorder   Patient may benefit from continued psychoeducation and brief therapeutic interventions regarding coping with symptoms of mood instability, anxiety, depression, and life stress   PLAN: 1. Follow up with behavioral health clinician on : Call Cathi Hazan at [phone] as needed 2. Behavioral recommendations:  -Continue using self-coping strategies that remain helpful to manage emotions  -Consider establishing with ongoing therapist of choice  3. Referral(s): [place] (In Clinic)  I discussed the assessment and treatment plan with the patient and/or parent/guardian. They were provided an opportunity to ask questions and all were answered. They agreed with the plan and demonstrated an understanding of the instructions.   They were advised to call back or seek an in-person evaluation if the symptoms worsen or if the condition fails to improve as anticipated.   Confirmed patient's address: Yes  Confirmed patient's phone number: Yes  Any changes to demographics: No   Confirmed patient's insurance: Yes  Any changes to patient's insurance: No   I discussed the limitations of evaluation and management by telemedicine and the availability of in person appointments.  I discussed that the purpose of this visit is to provide behavioral health care while limiting exposure to the novel coronavirus.   Discussed there is a possibility of technology failure and discussed alternative modes of communication if that failure occurs.  Caroleen Hamman Mykai Wendorf  Depression screen [facility] 2/9 06/14/2020 04/06/2020 03/22/2020 05/08/2018 03/20/2018  Decreased Interest 0 1 3 0 1  Down, Depressed, Hopeless 2 0 3 0 0  PHQ - 2 Score 2 1 6  0 1  Altered sleeping 3 0 3 - 0  Tired, decreased energy 3 0  3 - 1  Change in appetite  3 1 3  - 1  Feeling bad or failure about yourself  1 0 3 - 0  Trouble concentrating 3 1 3  - 0  Moving slowly or fidgety/restless 3 1 3  - 0  Suicidal thoughts 0 0 1 - 0  PHQ-9 Score 18 4 25  - 3   GAD 7 : Generalized Anxiety Score 06/14/2020 04/06/2020 03/22/2020 03/20/2018  Nervous, Anxious, on Edge 3 0 3 1  Control/stop worrying 0 1 3 0  Worry too much - different things 3 0 3 1  Trouble relaxing 3 0 3 0  Restless 3 0 3 0  Easily annoyed or irritable 3 0 3 2  Afraid - awful might happen 0 0 3 0  Total GAD 7 Score 15 1 21  4

## 2020-06-14 ENCOUNTER — Ambulatory Visit (INDEPENDENT_AMBULATORY_CARE_PROVIDER_SITE_OTHER): Payer: Medicaid Other | Admitting: Clinical

## 2020-06-14 DIAGNOSIS — F3281 Premenstrual dysphoric disorder: Secondary | ICD-10-CM | POA: Diagnosis not present

## 2020-06-14 NOTE — Patient Instructions (Addendum)
Center for [facility] at [facility] for Women [place], [place] [address] [phone] (main office) [phone] ([place] office)  [place]:   What if I or someone I know is in crisis?  . If you are thinking about harming yourself or having thoughts of suicide, or if you know someone who is, seek help right away.  . Call your doctor or mental health care provider.  . Call 911 or go to a hospital emergency room to get immediate help, or ask a friend or family member to help you do these things.  . Call the [place] National Suicide Prevention Lifeline's toll-free, 24-hour hotline at 1-800-273-TALK 757-201-4111) or TTY: 1-800-799-4 TTY 815 269 4152) to talk to a trained counselor.  . If you are in crisis, make sure you are not left alone.   . If someone else is in crisis, make sure he or she is not left alone   24 Hour :   [place] National Suicide Hotline: 240-674-9670  Therapeutic Alternative Mobile Crisis: 972-493-5196   [facility]  [address] [place], [place] [address]  [phone] or [phone]  [facility] of the [employer] (Domestic Violence, Rape & Victim Assistance)  [phone]  [place]  [address], [place]  [address]   [phone] or 5160274927   [place]: [phone] (8am-4pm) or [phone] (after hours)           [facility] [address] [place], [place] [address] [phone] Guilfordcareinmind.com  *Interpreters available *Accepts Medicaid, Medicare, uninsured  [facility]   Mon-Fri: 8am-5pm [address] [place], [place] [phone](phone); [phone]) BloggerCourse.com  *Accepts Medicare  [facility] [place], Fri: 8am-4pm [address] [place], [place]  (phone); [phone] (fax) TaskTown.es  *[place] Mon-Fri: 9am-5pm  [address], [place], [place] (phone); [phone]  [email or web address].[email or web address]  *Accepts Medicaid  [facility] [address] [place], [place]  SalonLookup.es   [facility] [place], 8:30am-12pm/1pm-2:30pm [address] [place], [place] (phone); [phone] (fax) www.fspcares.org  *Accepts Medicaid, sliding-scale*Bilingual services available  [facility] Mon-Fri, 8am-7pm [place], [place]  [phone](phone); ([phone]) www.famsolutions.org  *Accepts Medicaid *Bilingual services available  [facility] Mon-Fri: 8am-5pm, Saturday by appointment only [place], [place], [place] (phone); [phone] (fax) www.[email or web address]   [facility] [address] [place], [place], [place] www.[email or web address]  *Free & reduced services for uninsured and underinsured individuals *Bilingual services for Spanish-speaking clients 21 and under  [facility], [address] [place], [place]); [phone]) RunningConvention.de  *Bring your own interpreter at first visit *Accepts Medicare and [facility]  [place] Mon-Fri: 9am-5:30pm [address] [place], [place], [place] (phone), [phone] (fax) After hours crisis line: ([phone] www.neuropsychcarecenter.com  *Accepts Medicare and Medicaid  [employer], 8am-6pm [address] [place], [place] (phone); [phone] (fax) http://presbyteriancounseling.org  *Subsidized costs  available  [facility] Mon-Fri: 8am-4pm [address], [place], [place] [phone](phone); [phone]) www.psychotherapeuticservices.com  *Accepts Medicaid  [facility] Same day access hours: Mon-Fri, 8:30-3pm Crisis hours: Mon-Fri, 8am-5pm [place], [place] Same day access hours: Mon-Fri, 8:30-3pm Crisis hours: Mon-Fri, 8am-8pm [address], [place], [place] (phone); [phone] (fax) www.rhahealthservices.org  *  Accepts Medicaid and Medicare  [facility], [place], Fri: 9am-9pm Tues, Thurs: 9am-6pm [place], [place], [place] (phone); [phone] (fax) https://[email or web address]  *(Accepts Medicare and Medicaid; payment plans available)*Bilingual services available  [facility], [facility], [facility] [address] [place], [place] (phone); [phone] (fax) www.[email or web address] [address], [place], [place], [place]  OmahaConnections.[email or web address]  *Bilingual services available  [facility] (Social [facility] Emotional [facility]) Mon-Thurs: 8am-8pm [address] [place], [place], [place] (phone); [phone] (fax) LostMillions.[email or web address]  *Accepts Medicaid*Bilingual services available  [place] [address], [place], [place], [place] (phone) DeadConnect.com.cy  *Accepts Medicaid *Bilingual services available  [facility] Mon-Fri, 9am-4:45pm [address] [place], [place] (phone); [phone] (fax) http://tlc-[email or web address]  *Accepts Medicare  [facility] Mon-Thurs: 8:30-8pm, Fri: 8:30am-7pm [address], [place], [place] (3rd floor) [phone] (phone); [phone] (fax) VIPinterview.si  *Accepts Medicaid; income-based reduced rates  available  [facility] Mon-Fri: 8am-5pm [address] [place], [place], [place] (phone); [phone] (fax) http://www.[email or web address]  *Accepts Medicaid*Bilingual services available  [place], [place], [place], [place] (phone); [phone] (fax) www.youthfocus.org  *Free emergency housing and clinical services for youth in crisis  [facility] ([place])  [address] [place] [phone] www.mhag.org  *Provides direct services to individuals in recovery from mental illness, including support groups, recovery skills classes, and one on one peer support  [employer] on [place]) [place] helpline: 661-116-7711  https://namiguilford.org  *A community hub for information relating to local resources and services for the friends and families of individuals living alongside a mental health condition, as well as the individuals themselves. Classes and support groups also provided

## 2020-06-25 ENCOUNTER — Encounter: Payer: Self-pay | Admitting: *Deleted

## 2020-06-25 ENCOUNTER — Other Ambulatory Visit: Payer: Self-pay

## 2020-06-25 ENCOUNTER — Ambulatory Visit (INDEPENDENT_AMBULATORY_CARE_PROVIDER_SITE_OTHER): Payer: Medicaid Other | Admitting: *Deleted

## 2020-06-25 DIAGNOSIS — Z3200 Encounter for pregnancy test, result unknown: Secondary | ICD-10-CM

## 2020-06-25 LAB — POCT PREGNANCY, URINE: Preg Test, Ur: NEGATIVE

## 2020-06-25 NOTE — Progress Notes (Signed)
Pt submitted urine sample for pregnancy test earlier today. I called her and a gentleman answered. He stated that [name] was not with him and he was aware of the reason for my call. I stated that I would not be able to discuss any medical information with him and would need to speak with Holly Vazquez only. He provided the # [phone] and stated that she may be able to be reached at that number. When I called, I got voicemail. I left a message stating that I would send a MyChart message to her. If she is not able to access her MyChart account, she may call the office on Monday for her results. Pt's UPT was negative. She will need to be questioned regarding LMP and any pregnancy symptoms she is having.

## 2020-06-29 NOTE — Progress Notes (Signed)
I have reviewed this chart and agree with the RN/CMA assessment and management.    K. Meryl Taylr Meuth, M.D. Attending [facility] (Faculty Practice)   

## 2020-07-05 ENCOUNTER — Other Ambulatory Visit: Payer: Self-pay

## 2020-07-05 ENCOUNTER — Other Ambulatory Visit (HOSPITAL_COMMUNITY)
Admission: RE | Admit: 2020-07-05 | Discharge: 2020-07-05 | Disposition: A | Discharge status: Home / Self Care | Payer: Medicaid Other | Source: Ambulatory Visit | Attending: Family Medicine | Admitting: Family Medicine

## 2020-07-05 ENCOUNTER — Encounter: Payer: Self-pay | Admitting: *Deleted

## 2020-07-05 ENCOUNTER — Ambulatory Visit (INDEPENDENT_AMBULATORY_CARE_PROVIDER_SITE_OTHER): Payer: Medicaid Other | Admitting: *Deleted

## 2020-07-05 VITALS — BP 120/72 | HR 66 | Ht 64.5 in

## 2020-07-05 DIAGNOSIS — N644 Mastodynia: Secondary | ICD-10-CM | POA: Diagnosis not present

## 2020-07-05 DIAGNOSIS — N898 Other specified noninflammatory disorders of vagina: Secondary | ICD-10-CM | POA: Diagnosis not present

## 2020-07-05 DIAGNOSIS — R5383 Other fatigue: Secondary | ICD-10-CM

## 2020-07-05 NOTE — Progress Notes (Signed)
Pt reports yellow vaginal discharge x1 month. There is also odor occasionally. She denies irritation or itching. Self swab obtained. Pt also states she has continued to have breast pain, is feeling very tired and decreased appetite. She states she has been having periods every 3 weeks and yet she feels pregnant.  LMP was 9/3/2. UPT today is negative. Pt has scheduled appt w/Dr. Kennon Rounds on 10/12 and was advised to discuss her concerns further at that time. Pt informed that she will be notified of today's test result and treatment indicated if any via MyChart. She voiced understanding of all information and instructions given.

## 2020-07-06 ENCOUNTER — Ambulatory Visit: Payer: Medicaid Other

## 2020-07-06 LAB — CERVICOVAGINAL ANCILLARY ONLY
Bacterial Vaginitis (gardnerella): POSITIVE — AB
Candida Glabrata: NEGATIVE
Candida Vaginitis: NEGATIVE
Chlamydia: NEGATIVE
Comment: NEGATIVE
Comment: NEGATIVE
Comment: NEGATIVE
Comment: NEGATIVE
Comment: NEGATIVE
Comment: NORMAL
Neisseria Gonorrhea: NEGATIVE
Trichomonas: NEGATIVE

## 2020-07-06 LAB — POCT PREGNANCY, URINE: Preg Test, Ur: NEGATIVE

## 2020-07-08 ENCOUNTER — Telehealth: Payer: Self-pay | Admitting: Obstetrics and Gynecology

## 2020-07-08 DIAGNOSIS — B9689 Other specified bacterial agents as the cause of diseases classified elsewhere: Secondary | ICD-10-CM

## 2020-07-08 DIAGNOSIS — N760 Acute vaginitis: Secondary | ICD-10-CM

## 2020-07-08 MED ORDER — CLINDAMYCIN HCL 300 MG PO CAPS: 300.0000 mg | ORAL_CAPSULE | Freq: Two times a day (BID) | ORAL | 0 refills | Status: AC

## 2020-07-08 NOTE — Telephone Encounter (Signed)
Attempted to call pt regarding result of swab collected in clinic given concern of vaginal discharge and odor. Wet prep resulted positive for bacterial vaginosis. Given reaction of hives to flagyl, will send script for clinda 300mg  BID x7d to pt's preferred pharmacy and also send MyChart message given unable to speak with pt today.  Randa Ngo, MD OB Fellow, Faculty Practice 07/08/2020 11:05 AM

## 2020-07-27 ENCOUNTER — Encounter: Payer: Self-pay | Admitting: Family Medicine

## 2020-07-27 ENCOUNTER — Other Ambulatory Visit: Payer: Self-pay

## 2020-07-27 ENCOUNTER — Ambulatory Visit (INDEPENDENT_AMBULATORY_CARE_PROVIDER_SITE_OTHER): Payer: Medicaid Other | Admitting: Family Medicine

## 2020-07-27 VITALS — BP 106/76 | HR 72 | Wt 147.1 lb

## 2020-07-27 DIAGNOSIS — Z658 Other specified problems related to psychosocial circumstances: Secondary | ICD-10-CM | POA: Diagnosis not present

## 2020-07-27 DIAGNOSIS — F3281 Premenstrual dysphoric disorder: Secondary | ICD-10-CM

## 2020-07-27 MED ORDER — SERTRALINE HCL 25 MG PO TABS: 25.0000 mg | ORAL_TABLET | Freq: Every day | ORAL | 2 refills | Status: DC

## 2020-07-27 NOTE — Progress Notes (Signed)
° °  Subjective:    Patient ID: Holly Vazquez is a 39 y.o. female presenting with Gynecologic Exam  on 07/27/2020  HPI: Having some mood changes around her cycle. Lacks motivation to do anything. Crying a lot or sleeping a lot Managing it better and not lashing out. Has to soke weed to be able to tolerate people. Having PMDD Left her house and the kids and became homeless. Left house in June. Splitting custody with their dad. In 2 BR apt. Now. Needs money.  Review of Systems  Constitutional: Negative for chills and fever.  Respiratory: Negative for shortness of breath.   Cardiovascular: Negative for chest pain.  Gastrointestinal: Negative for abdominal pain, nausea and vomiting.  Genitourinary: Negative for dysuria.  Skin: Negative for rash.      Objective:    BP 106/76    Pulse 72    Wt 147 lb 1.6 oz (66.7 kg)    LMP 07/13/2020 (Approximate)    BMI 24.86 kg/m  Physical Exam Constitutional:      General: She is not in acute distress.    Appearance: She is well-developed.  HENT:     Head: Normocephalic and atraumatic.  Eyes:     General: No scleral icterus. Cardiovascular:     Rate and Rhythm: Normal rate.  Pulmonary:     Effort: Pulmonary effort is normal.  Abdominal:     Palpations: Abdomen is soft.  Musculoskeletal:     Cervical back: Neck supple.  Skin:    General: Skin is warm and dry.  Neurological:     Mental Status: She is alert and oriented to person, place, and time.         Assessment & Plan:   Problem List Items Addressed This Visit      Unprioritized   PMDD (premenstrual dysphoric disorder)    After careful consideration, she will begin Zoloft.  We will start at 25 mg, take half of a tablet daily for 3 to 5 days until you are tolerating side effects.  If the medicine makes you sleepy take at bedtime, if it keeps you awake take in the morning.  Then moved to 1 tablet daily.  Usual effective dose is between 50-200 mg.  Usual onset of action, getting  energy back, suicidality discussed at length with patient.  We will have her follow-up with Roselyn Reef.  We will follow her up in 4 weeks to ensure we continue to adjust dosing.      Relevant Medications   sertraline (ZOLOFT) 25 MG tablet    Other Visit Diagnoses    Psychosocial stressors    -  Primary   Relevant Orders   Ambulatory referral to [place]      Total time in review of prior notes, pathology, labs, history taking, review with patient, exam, note writing, discussion of options, plan for next steps, alternatives and risks of treatment: 20 minutes.  Return in about 4 weeks (around 08/24/2020).  Donnamae Jude 07/28/2020 10:14 PM

## 2020-07-27 NOTE — Patient Instructions (Signed)
 Preventive Care 21-37 Years Old, Female Preventive care refers to visits with your health care provider and lifestyle choices that can promote health and wellness. This includes:  A yearly physical exam. This may also be called an annual well check.  Regular dental visits and eye exams.  Immunizations.  Screening for certain conditions.  Healthy lifestyle choices, such as eating a healthy diet, getting regular exercise, not using drugs or products that contain nicotine and tobacco, and limiting alcohol use. What can I expect for my preventive care visit? Physical exam Your health care provider will check your:  Height and weight. This may be used to calculate body mass index (BMI), which tells if you are at a healthy weight.  Heart rate and blood pressure.  Skin for abnormal spots. Counseling Your health care provider may ask you questions about your:  Alcohol, tobacco, and drug use.  Emotional well-being.  Home and relationship well-being.  Sexual activity.  Eating habits.  Work and work environment.  Method of birth control.  Menstrual cycle.  Pregnancy history. What immunizations do I need?  Influenza (flu) vaccine  This is recommended every year. Tetanus, diphtheria, and pertussis (Tdap) vaccine  You may need a Td booster every 10 years. Varicella (chickenpox) vaccine  You may need this if you have not been vaccinated. Human papillomavirus (HPV) vaccine  If recommended by your health care provider, you may need three doses over 6 months. Measles, mumps, and rubella (MMR) vaccine  You may need at least one dose of MMR. You may also need a second dose. Meningococcal conjugate (MenACWY) vaccine  One dose is recommended if you are age 19-21 years and a first-year college student living in a residence hall, or if you have one of several medical conditions. You may also need additional booster doses. Pneumococcal conjugate (PCV13) vaccine  You may need  this if you have certain conditions and were not previously vaccinated. Pneumococcal polysaccharide (PPSV23) vaccine  You may need one or two doses if you smoke cigarettes or if you have certain conditions. Hepatitis A vaccine  You may need this if you have certain conditions or if you travel or work in places where you may be exposed to hepatitis A. Hepatitis B vaccine  You may need this if you have certain conditions or if you travel or work in places where you may be exposed to hepatitis B. Haemophilus influenzae type b (Hib) vaccine  You may need this if you have certain conditions. You may receive vaccines as individual doses or as more than one vaccine together in one shot (combination vaccines). Talk with your health care provider about the risks and benefits of combination vaccines. What tests do I need?  Blood tests  Lipid and cholesterol levels. These may be checked every 5 years starting at age 20.  Hepatitis C test.  Hepatitis B test. Screening  Diabetes screening. This is done by checking your blood sugar (glucose) after you have not eaten for a while (fasting).  Sexually transmitted disease (STD) testing.  BRCA-related cancer screening. This may be done if you have a family history of breast, ovarian, tubal, or peritoneal cancers.  Pelvic exam and Pap test. This may be done every 3 years starting at age 21. Starting at age 30, this may be done every 5 years if you have a Pap test in combination with an HPV test. Talk with your health care provider about your test results, treatment options, and if necessary, the need for more   tests. Follow these instructions at home: Eating and drinking   Eat a diet that includes fresh fruits and vegetables, whole grains, lean protein, and low-fat dairy.  Take vitamin and mineral supplements as recommended by your health care provider.  Do not drink alcohol if: ? Your health care provider tells you not to drink. ? You are  pregnant, may be pregnant, or are planning to become pregnant.  If you drink alcohol: ? Limit how much you have to 0-1 drink a day. ? Be aware of how much alcohol is in your drink. In the [place] one drink equals one 12 oz bottle of beer (355 mL), one 5 oz glass of wine (148 mL), or one 1 oz glass of hard liquor (44 mL). Lifestyle  Take daily care of your teeth and gums.  Stay active. Exercise for at least 30 minutes on 5 or more days each week.  Do not use any products that contain nicotine or tobacco, such as cigarettes, e-cigarettes, and chewing tobacco. If you need help quitting, ask your health care provider.  If you are sexually active, practice safe sex. Use a condom or other form of birth control (contraception) in order to prevent pregnancy and STIs (sexually transmitted infections). If you plan to become pregnant, see your health care provider for a preconception visit. What's next?  Visit your health care provider once a year for a well check visit.  Ask your health care provider how often you should have your eyes and teeth checked.  Stay up to date on all vaccines. This information is not intended to replace advice given to you by your health care provider. Make sure you discuss any questions you have with your health care provider. Document Revised: 06/13/2018 Document Reviewed: 06/13/2018 Elsevier Patient Education  2020 Elsevier Inc.  

## 2020-07-28 ENCOUNTER — Encounter: Payer: Self-pay | Admitting: Family Medicine

## 2020-07-28 DIAGNOSIS — F3281 Premenstrual dysphoric disorder: Secondary | ICD-10-CM | POA: Insufficient documentation

## 2020-07-28 NOTE — BH Specialist Note (Signed)
Integrated Behavioral Health Follow Up Visit  MRN: [id number] Name: Holly Vazquez  Number of [place] Clinician visits: 4/6 Session Start time: 1: 20 Session End time: 1:49 Total time: 29  Type of Service: [place] Interpretor:No. Interpretor Name and Language: n/a  SUBJECTIVE: Holly Vazquez is a 39 y.o. female accompanied by n/a Patient was referred by Darron Doom, MD for psychosocial stress. Patient reports the following symptoms/concerns: Pt states her primary concern today is life stress; pt is feeling more "awake" after starting Zoloft,  prior to medication was unable to get out of bed, stopped working, depressed, anxious, and feeling disconnected with her own life; pt's goals are to spend time getting to know herself again and figuring out her next steps to be more aligned to her own values and priorities. Pt requests ongoing therapy options.  Duration of problem: Increase over time; Severity of problem: moderate  OBJECTIVE: Mood: Normal and Affect: Appropriate Risk of harm to self or others: No plan to harm self or others  LIFE CONTEXT: Family and Social: Pt lives with her children (16,14,10,9,7yo twins) during the week; children with their father on weekends School/Work: Began working for [employer]: Prioritizing time for self-reflection and working on career goals Life Changes: Interpersonal conflict with ex-husband/separation, loss of 1 vehicle, loss of businesses/work, all in past year  GOALS ADDRESSED: Patient will: 1.  Reduce symptoms of: mood instability and stress  2.  Increase knowledge and/or ability of: stress reduction  3.  Demonstrate ability to: Increase healthy adjustment to current life circumstances  INTERVENTIONS: Interventions utilized:  Medication Monitoring and Link to [employer] Standardized Assessments completed: PHQ9/GAD7 given in past two weeks  ASSESSMENT: Patient  currently experiencing PMDD (premenstrual dysphoric disorder) and Psychosocial stress.   Patient may benefit from continued psychoeducation and brief therapeutic interventions regarding coping with symptoms of anxiety, depression, mood instability, life stress .  PLAN: 1. Follow up with behavioral health clinician on : Call Constantine Ruddick at [phone] to schedule as needed 2. Behavioral recommendations:  -Accept referral to psychiatry/ongoing therapy -Continue taking Zoloft as prescribed by medical provider -Continue working towards personal career goals -Continue using healthy self-coping strategies that are helping to manage emotions 3. Referral(s): [facility] (In Clinic) and [facility] (LME/Outside Clinic)  Garlan Fair, [place]  Depression screen [facility] 2/9 07/27/2020 06/14/2020 04/06/2020 03/22/2020 05/08/2018  Decreased Interest 1 0 1 3 0  Down, Depressed, Hopeless 1 2 0 3 0  PHQ - 2 Score 2 2 1 6  0  Altered sleeping 0 3 0 3 -  Tired, decreased energy 2 3 0 3 -  Change in appetite 2 3 1 3  -  Feeling bad or failure about yourself  1 1 0 3 -  Trouble concentrating 1 3 1 3  -  Moving slowly or fidgety/restless 0 3 1 3  -  Suicidal thoughts 0 0 0 1 -  PHQ-9 Score 8 18 4 25  -   GAD 7 : Generalized Anxiety Score 07/27/2020 06/14/2020 04/06/2020 03/22/2020  Nervous, Anxious, on Edge 1 3 0 3  Control/stop worrying 1 0 1 3  Worry too much - different things 1 3 0 3  Trouble relaxing 0 3 0 3  Restless 0 3 0 3  Easily annoyed or irritable 1 3 0 3  Afraid - awful might happen 1 0 0 3  Total GAD 7 Score 5 15 1  21

## 2020-07-28 NOTE — Assessment & Plan Note (Signed)
After careful consideration, she will begin Zoloft.  We will start at 25 mg, take half of a tablet daily for 3 to 5 days until you are tolerating side effects.  If the medicine makes you sleepy take at bedtime, if it keeps you awake take in the morning.  Then moved to 1 tablet daily.  Usual effective dose is between 50-200 mg.  Usual onset of action, getting energy back, suicidality discussed at length with patient.  We will have her follow-up with Roselyn Reef.  We will follow her up in 4 weeks to ensure we continue to adjust dosing.

## 2020-08-09 ENCOUNTER — Ambulatory Visit (INDEPENDENT_AMBULATORY_CARE_PROVIDER_SITE_OTHER): Payer: Medicaid Other | Admitting: Clinical

## 2020-08-09 DIAGNOSIS — F3281 Premenstrual dysphoric disorder: Secondary | ICD-10-CM | POA: Diagnosis not present

## 2020-08-09 NOTE — Patient Instructions (Signed)
[facility] [address] [place], [place] [address] [phone] (main office) [phone] (Lunden Stieber's office)   

## 2020-08-25 ENCOUNTER — Ambulatory Visit: Payer: Medicaid Other | Admitting: Family Medicine

## 2020-08-25 ENCOUNTER — Other Ambulatory Visit: Payer: Self-pay

## 2020-08-26 ENCOUNTER — Telehealth: Payer: Self-pay | Admitting: Clinical

## 2020-08-26 NOTE — Telephone Encounter (Signed)
Attempt to f/u call with pt; Voicemail full, and unable to leave voice message.

## 2020-09-22 ENCOUNTER — Other Ambulatory Visit: Payer: Self-pay

## 2020-09-22 ENCOUNTER — Ambulatory Visit (INDEPENDENT_AMBULATORY_CARE_PROVIDER_SITE_OTHER): Payer: Medicaid Other | Admitting: Family Medicine

## 2020-09-22 ENCOUNTER — Encounter: Payer: Self-pay | Admitting: Family Medicine

## 2020-09-22 ENCOUNTER — Other Ambulatory Visit (HOSPITAL_COMMUNITY)
Admission: RE | Admit: 2020-09-22 | Discharge: 2020-09-22 | Disposition: A | Discharge status: Home / Self Care | Payer: Medicaid Other | Source: Ambulatory Visit | Attending: Family Medicine | Admitting: Family Medicine

## 2020-09-22 VITALS — BP 111/84 | HR 67 | Wt 143.3 lb

## 2020-09-22 DIAGNOSIS — Z124 Encounter for screening for malignant neoplasm of cervix: Secondary | ICD-10-CM

## 2020-09-22 DIAGNOSIS — F419 Anxiety disorder, unspecified: Secondary | ICD-10-CM

## 2020-09-22 DIAGNOSIS — N760 Acute vaginitis: Secondary | ICD-10-CM | POA: Diagnosis not present

## 2020-09-22 DIAGNOSIS — B9689 Other specified bacterial agents as the cause of diseases classified elsewhere: Secondary | ICD-10-CM

## 2020-09-22 DIAGNOSIS — F411 Generalized anxiety disorder: Secondary | ICD-10-CM | POA: Insufficient documentation

## 2020-09-22 DIAGNOSIS — F3281 Premenstrual dysphoric disorder: Secondary | ICD-10-CM | POA: Diagnosis not present

## 2020-09-22 DIAGNOSIS — L700 Acne vulgaris: Secondary | ICD-10-CM

## 2020-09-22 MED ORDER — HYDROXYZINE PAMOATE 25 MG PO CAPS: 25.0000 mg | ORAL_CAPSULE | Freq: Three times a day (TID) | ORAL | 0 refills | Status: DC | PRN

## 2020-09-22 MED ORDER — SERTRALINE HCL 50 MG PO TABS: 50.0000 mg | ORAL_TABLET | Freq: Every day | ORAL | 2 refills | Status: DC

## 2020-09-22 MED ORDER — CLINDAMYCIN PHOSPHATE 2 % VA CREA: 1.0000 | TOPICAL_CREAM | Freq: Every day | VAGINAL | 0 refills | Status: DC

## 2020-09-22 NOTE — Assessment & Plan Note (Signed)
Trial of Vistaril.

## 2020-09-22 NOTE — Assessment & Plan Note (Signed)
Increase zoloft--may need to increase again in 4 weeks.

## 2020-09-22 NOTE — Assessment & Plan Note (Signed)
>>  ASSESSMENT AND PLAN FOR ANXIETY WRITTEN ON 09/22/2020  5:16 PM BY PRATT, Shelbie Proctor, MD  Trial of Vistaril.

## 2020-09-22 NOTE — Progress Notes (Signed)
    Subjective:    Patient ID: Holly Vazquez is a 39 y.o. female presenting with Follow-up  on 09/22/2020  HPI: Here for f/u and needs pap. 1. Depression, still crying a lot. Recently, doubled dose of Zoloft to 50 mg. Also wieth anxiety that improves with Vistaril. 2. BV-did not take her meds for this, would like refill 3. Has breast lump, that is like a boil.   Review of Systems  Constitutional: Negative for chills and fever.  Respiratory: Negative for shortness of breath.   Cardiovascular: Negative for chest pain.  Gastrointestinal: Negative for abdominal pain, nausea and vomiting.  Genitourinary: Negative for dysuria.  Skin: Negative for rash.  Psychiatric/Behavioral: Positive for dysphoric mood.       Cries all the time.      Objective:    BP 111/84   Pulse 67   Wt 143 lb 4.8 oz (65 kg)   BMI 24.22 kg/m  Physical Exam Constitutional:      General: She is not in acute distress.    Appearance: She is well-developed.  HENT:     Head: Normocephalic and atraumatic.  Eyes:     General: No scleral icterus. Cardiovascular:     Rate and Rhythm: Normal rate.  Pulmonary:     Effort: Pulmonary effort is normal.  Abdominal:     Palpations: Abdomen is soft.  Genitourinary:    Comments: BUS normal, vagina is pink and rugated, cervix is parous without lesion  Musculoskeletal:     Cervical back: Neck supple.  Skin:    General: Skin is warm and dry.     Comments: Comedone noted on skin at breast   Neurological:     Mental Status: She is alert and oriented to person, place, and time.         Assessment & Plan:   Problem List Items Addressed This Visit      Unprioritized   PMDD (premenstrual dysphoric disorder)    Increase zoloft--may need to increase again in 4 weeks.      Relevant Medications   sertraline (ZOLOFT) 50 MG tablet   hydrOXYzine (VISTARIL) 25 MG capsule   Anxiety    Trial of Vistaril.      Relevant Medications   sertraline (ZOLOFT) 50 MG  tablet   hydrOXYzine (VISTARIL) 25 MG capsule    Other Visit Diagnoses    Pap smear for cervical cancer screening    -  Primary   Relevant Orders   Cytology - PAP( [place])   BV (bacterial vaginosis)       Relevant Medications   clindamycin (CLEOCIN) 2 % vaginal cream   Acne comedone       Sterile technique and unroof and remove contents if able. Keep clean.      Total time in review of prior notes, pathology, labs, history taking, review with patient, exam, note writing, discussion of options, plan for next steps, alternatives and risks of treatment: 22 minutes.  Return in about 4 weeks (around 10/20/2020) for virtual.  Donnamae Jude 09/22/2020 5:19 PM

## 2020-09-22 NOTE — Progress Notes (Signed)
Reports increasing Zoloft to 50 mg 1.5 weeks ago. Will need early refill. Lost clindamycin antibiotic that was prescribed for BV; needs new rx. Reports taking her children's hydroxyzine. Reports still needing marijuana at times.  Apolonio Schneiders RN 09/22/20

## 2020-09-24 LAB — CYTOLOGY - PAP
Comment: NEGATIVE
Diagnosis: NEGATIVE
High risk HPV: NEGATIVE

## 2020-10-21 ENCOUNTER — Other Ambulatory Visit: Payer: Self-pay | Admitting: Family Medicine

## 2020-10-21 DIAGNOSIS — F419 Anxiety disorder, unspecified: Secondary | ICD-10-CM

## 2020-10-21 MED ORDER — HYDROXYZINE PAMOATE 25 MG PO CAPS: 25.0000 mg | ORAL_CAPSULE | Freq: Three times a day (TID) | ORAL | 3 refills | Status: DC | PRN

## 2020-10-26 ENCOUNTER — Other Ambulatory Visit: Payer: Self-pay | Admitting: *Deleted

## 2020-10-26 DIAGNOSIS — N898 Other specified noninflammatory disorders of vagina: Secondary | ICD-10-CM

## 2020-10-26 MED ORDER — FLUCONAZOLE 150 MG PO TABS: 150.0000 mg | ORAL_TABLET | Freq: Once | ORAL | 0 refills | Status: AC

## 2020-10-28 ENCOUNTER — Other Ambulatory Visit: Payer: Self-pay | Admitting: Lactation Services

## 2020-10-28 MED ORDER — FLUCONAZOLE 150 MG PO TABS: 150.0000 mg | ORAL_TABLET | Freq: Once | ORAL | 0 refills | Status: AC

## 2020-10-28 NOTE — Progress Notes (Signed)
Reordered Diflucan as original order did not transmit to Pharmacy.

## 2020-11-19 ENCOUNTER — Telehealth: Payer: Self-pay | Admitting: *Deleted

## 2020-11-19 NOTE — Telephone Encounter (Signed)
Called pt in reference to a Mychart message which she sent to our staff. Pt stated that she had shaved her pubic area and believes she may have cut her "lip" on the side. A bump has formed which is painful and has some light pink/red drainage. After discussing with Dr. Sabra Heck, I advised pt to apply warm compresses to the area several times per Sabel Hornbeck. She should apply antibiotic ointment following the warm soak and especially @ bedtime. If she does not notice improvement by 2/7, she should contact our office for appointment. Pt voiced understanding of all information and instructions given.

## 2021-01-19 ENCOUNTER — Ambulatory Visit: Payer: Medicaid Other | Admitting: Clinical

## 2021-01-19 ENCOUNTER — Other Ambulatory Visit: Payer: Self-pay

## 2021-01-19 DIAGNOSIS — Z658 Other specified problems related to psychosocial circumstances: Secondary | ICD-10-CM

## 2021-01-19 DIAGNOSIS — F3281 Premenstrual dysphoric disorder: Secondary | ICD-10-CM

## 2021-01-19 NOTE — BH Specialist Note (Signed)
I reviewed patient visit with the [facility] Intern, and I concur with the treatment plan, as documented in the [facility] Intern note.   No charge for this visit due to [facility] Intern seeing patient.  Vesta Mixer, MSW, [place] for [place] at [facility] for [place] Follow Up In-Person Visit  MRN: [id number] Name: Holly Vazquez  Number of [place] Clinician visits: 4/6 Session Start time: 11:01AM  Session End time: 11:16AM Total time: 15 minutes  Types of Service: Individual psychotherapy   Subjective: Holly Vazquez is a 40 y.o. female  Patient was referred by Loma Boston, MD for Premenstrual dysphoric disorder Patient reports the following symptoms/concerns: experiencing homelessness, food resources, and transportation Duration of problem: Last four months; Severity of problem: severe  Objective: Mood: Depressed and Affect: Constricted Risk of harm to self or others: No plan to harm self or others  Life Context: Family and Social: Pt has 6 children School/Work: Pt was working for [employer] but her tags expired so she has not been able to work Self-Care: Pt is focused on providing food, housing, and transportation Life Changes: Recent divorce  Patient and/or Family's Strengths/Protective Factors: Sense of purpose  Goals Addressed: Patient will: 1.  Reduce symptoms of: depression and stress  2.  Increase knowledge and/or ability of: resources 3.  Demonstrate ability to: Increase healthy adjustment to current life circumstances and Decrease self-medicating behaviors by taking prescribed medication from psychiatrist  Progress towards Goals: Ongoing  Interventions: Interventions utilized:  Link to [employer] Standardized Assessments completed: GAD-7 and PHQ 9  Patient and/or Family Response: Pt expressed understanding and willingness to adhere to treatment  plan.  Patient Centered Plan: Patient is on the following Treatment Plan(s): IBH Assessment: Patient currently experiencing Premenstrual dysphoric disorder and Psychosocial stressors.   Patient may benefit from case management and ongoing behavioral health services.  Plan: 1. Follow up with behavioral health clinician on : 01/25/2021 at 10:45AM 2. Behavioral recommendations: Link to resources 3. Referral(s): [place] (In Clinic)  Gertha Calkin (Supervisor: Vesta Mixer)

## 2021-01-19 NOTE — Patient Instructions (Signed)
Center for [facility] at [facility] for Women [place], [place] [address] [phone] (main office) [phone] [facility] office)  [place] (serves [place], [place], [place], [place], [place], [place], [place], [place], [place], [place], [place], [place], and [place] counties) [address] [place], [place] [address] [phone] http://dawson-may.com/  **Rental assistance, Home Rehabilitation,Weatherization Assistance Program, Forensic psychologist, Housing Voucher Program  Jabil Circuit for Housing and Commercial Metals Company Studies: Chief Financial Officer Resources to residents of [place], [place], and Circle 1. Make sure you have your documents ready, including:  Marland Kitchen (Household income verification: 2 months pay stubs, unemployment/social security award letter, statement of no income for all household members over 52) . Photo ID for all household members over 41 . Utility Bill/Rent Ledger/Lease: must show past due amount for utilities/rent, or the rental agreement if rent is current 2. Start your application online or by paper (in [place] or [place]) at:     http://boyd-evans.org/  3. Once you have completed the online application, you will get an email confirmation message from the county. Expect to hear back by phone or email at least 6-10 weeks from submitting your application.  4. While you wait:  . Call [phone] to check in on your application . Let your landlord know that you've applied. Your landlord will be asked to submit documents (W-9) during this application process. Payments will be made directly to the landlord/property [place] or utility assistance for Fortune Brands, [place], and [place] . Apply at [email or web address]://rb.gy/[email or web address] . Questions? Call or  email Renee at [phone] or [email or web address]   Eviction Mediation Program: The HOPE Program Https://www.rebuild.http://mills-williams.net/ HOPE Progam serves low-income renters in [place] counties, defined as less than or equal to 80% of the area median income for the county where the renter lives. In the following 12 counties, you should apply to your local rent and utility assistance program INSTEAD OF the HOPE Program: [place], [place], [place], [place], [place], [place], [place], [place], [place], [place], [place], [place]  . If you live outside of [facility], contact [place] call center at [phone] to talk to a Program Representative Monday-Friday, 8am-5pm . Note that Native American tribes also received federal funding for rent and utility assistance programs. Recognized members of the following tribes will be served by programs managed by tribal governments, including: Russian [place] Band of Cherokee Indians, [place], [place], [place] of [place] and [place] . Eviction Mediation Coordinator, Renee, [phone] [email or web address] . Housing Resources Navigator, [place], [phone] [email or web address] .[place] [address], [place], [place] [address] [phone] www.gha-[place].[facility] [address] Lin Landsman [place], [place] [address] [phone] https://manning.com/ **Programs include: Engineer, building services and Housing Counseling, Healthy Doctor, general practice, Homeless Prevention and [place] [address] [place], [place], [place] [address] [phone] www.https://www.farmer-stevens.info/ **housing applications/recertification; tax payment relief/exemption under specific qualifications  [facility] [address], [place], [place]  [address] www.onlinegreensboro.com/~maryshouse **transitional housing for women in recovery who have minor children or are pregnant  [place], [place], [place] [address] ArtistMovie.se  **emergency shelter and support services for families facing homelessness  [place] [address], [place], [place] [address] [phone] www.youthfocus.org **transitional housing to pregnant women;  emergency housing for youth who have run away, are experiencing a family crisis, are victims of abuse or neglect, or are homeless  [facility] [address] [place], [place] [address] [phone] ircgso.org **Drop-in center for people experiencing homelessness; overnight warming center when temperature is 25 degrees or below  Re-Entry Staffing [place], [place], [place] [address] [phone] https://reentrystaffingagency.org/ **help with affordable housing to people experiencing homelessness or unemployment due to incarceration  [facility] [address] [place], [place] [address] [phone] www.greensborourbanministry.org  **emergency and transitional housing, rent/mortgage assistance, utility assistance  [facility] [address], [place], [place] [address] [phone] www.salvationarmyofgreensboro.org **emergency and transitional housing  Habitat for Comcast [place], [place], [place] [address] [phone] Www.habitatgreensboro.[place], [place], [place] [address] [phone] https://chshousing.org **[place] and [facility]  Housing Consultants Group [address] [place] [address], [place], [place] [address] [phone] arrivance.com **home buyer education courses, foreclosure prevention  [facility] [address] [place], [place] [address] [phone] WirelessNovelties.no **Environmental Exposure Assessment (investigation of homes where either children or pregnant women with a confirmed elevated blood lead level reside)  [facility] of Vocational Rehabilitation-Horace [place], [place], [place] [address] [phone] http://www.perez.com/ **Home Expense Assistance/Repairs Program; offers home accessibility updates, such as ramps or bars in the bathroom  Self-Help Credit Union-[place] [address] [place], [place] [address] [phone] https://www.self-help.org/locations/[place]ent[place]-on-the-Lake-branch **Offers credit-building and banking services to people unable to use [place] of [facility] [place], [place], [place] [address] [phone] RoulettePays.com.br   [facility] [address], [place], [place] [address] [phone]  ClubMonetize.fr **housing applications/recertification, emergency and transitional housing  Open Deere [employer] Company of Fortune Brands [address], [place], [place] [address] [phone] www.odm-hp.org  **emergency and permanent housing; rent/mortgage payment assistance  Habitat for PPL Corporation, [place] and [place] [address] [place], [place] [address] [phone] ArchitectReviews.com.au  Family Services of the [place], Fortune Brands [place], [place], [place] [address] www.familyservice-piedmont.org **emergency shelter for victims of domestic violence and sexual assault  Senior Resources-[place] [address] [place], [place] [address] [phone] www.senior-resources-guilford.org **Home expense assistance/repairs for older [place] of [place],  [place], [place] [address] [phone] NameDisc.is  **May offer help with minor housing repairs  [facility] [address], [place], [place] [address] [phone] **emergency housing for victims of domestic violence  Housing Resources [place], [place], [facility])  [facility] [address] [place], [place] [address] [phone] www.newrha.[facility] [address] [place], [place] [address] ([place] [address] [place], [place], [place] [address] [phone] www.co.[place].us **Housing applications/recertification; tax payment relief/exemption w specific qualifications  [place] for Homeless [address] [place], [place] [address] [phone] Http://www.rchelpforhomeless.org/HOME_PAGE.html  HELP, [place] [address], [place], [place] [address] [phone] [place] [phone] Hours of Operation: Monday-Friday, 8:30am-5:00pm http://helpincorporated.org **Includes emergency housing for victims of domestic violence  Transportation Resources [place] ([place]) [place], [place], [place] [address] https://www.Coats-Albion.gov/departments/transportation/gdot-divisions/Cheney-transit-agency-public-transportation-division     . Fixed-route bus services, including regional fare cards for  PART, GTA, HiTran, and WSTA buses.  . Reduced fare bus ID's available for Medicaid, Medicare, and "orange card" recipients.  Marland Kitchen SCAT offers curb-to-curb and door-to-door bus services for people with disabilities who are unable to use a fixed-bus route; also offers a shared-ride program.   Helpful tips:  -Routes available online and physical maps available at the main bus hub lobby (each for a specific  route) -Smartphone directions often include bus routes (see the "bus" icon, next to the "car" and "walk" icons) -Routes differ on weekends, evenings and holidays, so plan ahead!  -If you have Medicaid, Medicare, or orange card, plan to obtain a reduced-fare ID to save 50% on rides. Check days and times to obtain an ID, and bring all necessary documents.   Ecol[employer] System [place]) [place], [place]. [address], [place], [place] [address] [phone] http://www.smith-bell.org/ **Fixed-bus route services, and demand response bus service for older adults  Department of Social [facility] [address] [place], [place] [address] [phone] www.ProfileWatcher.fi **Medicaid transportation is available to [facility] recipients who need assistance getting to [facility] medical appointments and providers  [facility] [address] [place] [address], [place], [place] [address] www.cjmedicaltransportation.com  ** Offers non-emergency transportation for medical appointments  Wheels [place], [place], [place] [address] [phone] www.wheels4hope.org **REFERRAL NEEDED by specific agencies (see website), after meeting specified criteria only  Mirant for [employer] Energy) [address] [place], [place] [address] [phone]  BikerFestival.is  *Regional fixed-bus routes between counties (example: [place] to [place]) and Coca[employer]Cola of [place], [place], [place] [address] [phone] Http://senior-resources-guilford.org  Production assistant, radio available (call or see website for details)  [place] [address] [place], [place] [address] [phone] www.senioradults.org  **[place], Disability  and [place] [address], [place], [place] [address] [phone] www.adtsrc.[place] Department of Health and [facility] [place], [place], [place] [address] [phone]  http://goodwin-walker.biz/  **Transportation expense assistance  Department of [place] [address] [place], [place], [place] [address] [phone] www.co.rockingham.Welby.us/pview.aspx?id=14850&catid=407  **Medicaid transportation for recipients who need assistance getting to [facility] medical appointments and providers    [place] [address] [place], [place] [address] [phone]   or  www.http://james-garner.info/ **SNAP/EBT/ Other nutritional benefits  [facility] [address], [place], [place] [address] [phone]  or  https://palmer-smith.com/ **WIC for  women who are pregnant and postpartum, infants and children up to 34 years old  [place] [address] [place], [place] [address] [phone]   or   www.theblessedtable.org  **Food pantry  Brother Kolbe's [place] [address] [place], [place], [place] [address] [phone]   or   https://brotherkolbes.godaddysites.com  **Emergency food and prepared meals  [place] [address], [place], [place] [address] [phone]   or   www.cedargrovetop.us **Food pantry  [place] Pantry [address] [place], [place] [address] [phone]   or   www.https://hartman-jones.net/ **Food pantry  Eli Lilly [employer] Hands Food Pantry [address], [place], [place] [address] [phone] **Food pantry  [facility] [address],  [place],  [address] [phone]   or   www.greensborourbanministry.org  **Food pantry and prepared meals  Hess Corporation Services- [phone] [place]  [address] [place], [place], [place] [address] DomainerFinder.be  **Food pantry  [place] [employer] Church Food Pantry [address] [place], [place] [address] [phone]   or   www.lbcnow.org  **Food pantry  One Step Further [address], [place], [place] [address] [phone]   or   http://patterson-parker.net/ **Food pantry, nutrition education, gardening activities  [place] [address] [place], [place] [address] [phone] **Food pantry  [facility] [place] [address] [place], [place] [address] [phone]   or   www.salvationarmyofgreensboro.Lovette Cliche of [place], [place], [place] [address] [phone]   or   http://senior-resources-guilford.org [facility] on [place] [address] [place], [place] [address] [phone]   or   www.stmattchurch.com  **Food pantry  [place] [address] [place], [place] [address] [phone]   or   vandaliapresbyterianchurch.org **Food pantry  [place] Pantry [address] [place], [place], [place] [address] [phone]   or   www.Mormon101.be Food Civil Service fast streamer of [place] [address] [place], [place] [address] [phone] **Food pantry  [place] Resources   Department of [facility] [address], [place], [place] [address] [phone]   or   www.co.[place].Mendocino.us/ph/  [place] [facility]) [address], [place], [place] [address] [phone]   or    MedicationWebsites.com.au **WIC for pregnant and postpartum women, infants and children up to 47 years old  Compassionate Pantry [address], [place], [place] [address] [phone] **Food pantry  [place] [address] [place], [place] [address] [phone]   or   emerywoodbaptistchurch.com *Food pantry  Five loaves Two Fish Food Pantry [address], [place], [place] [address] [phone]   or   www.fcchighpoint.Radonna RickerFood pantry  Helping Hands [facility] [address], [place], [place] [address] [phone]   or   http://www.green.com/ **Food pantry  [place] [address], [place], [place] [address] [phone]   or   www.facebook.com/KBCI1 Social worker of [place] [address], [place], [place] [address] [phone]   or   www.abbottscreek.org Ryder System of [place] [phone]   **Delivers meals  New Beginnings Full [facility] [address], [place], [place] [address] [phone]   or   nbfgm.sundaystreamwebsites.com  Programme researcher, broadcasting/film/video of Fortune Brands [address] [place], [place] [address] [phone]   or   www.odm-hp.org  **Food pantry  [place] [address] [place], [place] [address] [phone]   or   R2live.tv **Food pantry  [place] [address], [place], [place] [address] [phone]   or   ClubMonetize.fr **Emergency food and pet food  Senior [place] [address], [place], [place] [address] [phone]   or   www.senioradults.org **Congregate and delivered meals to older adults  [place] Way of Greater Fortune Brands [address], [place],  [address] [phone]   or   SamedayNews.com.cy **Back Pack Program  for elementary school students  [place] [address] [place],  [address] [phone]   or   www.wardstreetcommunityresources.org **Food pantry  Computer Sciences Corporation  Point [address] [place], [place] [address] [phone]   or   https://www.carlson.net/ **Emergency food, nutrition classes, food budgeting  Food Resources [place]  [address] [place], [place], [place] [address] [phone]  or   www.co.rockingham.Baudette.us/pview.aspx?id=14850&catid=407 **SNAP/Other nutrition benefits  [place], [place], [place] [address] [phone]   or  http://goodwin-walker.biz/  **SNAP/Other nutrition benefits  [facility] [place], [place], [place] [address] [phone]  or  http://www.rockinghamcountypublichealth.org **WIC for pregnant and postpartum women, infants and children up to 57 years old  Aging, Disability and [place] [address] [place], [place] [address] [phone]  or www.risodejaneiro.com **Prepared meals for older adults  [place] [address], [place], [place] [address] [phone]  or  NetworkingMixer.com.ee **Food pantry  Hands of God [address], [place], [place] [address] [phone]   or   https://www.handsofgod.org/  Insurance underwriter  Men in [place] [address] [place], [place] [address] [phone] **Food pantry  [facility] for [place]enne [place], [place], [place] [address] [phone]   or   www.ci.Ingalls.Forty Fort.us/government/parks_and_recreation/senior_center/index.php **Congregate meal for older adults  [facility] [place] [address] [place], [place] [address] [phone]   or  www.reidsvilleoutreachcenter.org  **Food pantry  [facility] [place] [address], [place], [place] [address] [phone]   or   http://reid-chang.com/ **Food pantry  [place], [place], [place] [address] [phone] www.[place]-Winona.com/fjc  [facility]:  [address], 2nd floor, [place], [place] [address] [phone])  Main line [phone]  [place] location **Parking is available in the [address] parking deck.  [place]:  [address] [place], [place] [address] [phone]) Main line [phone]  [place] location  **Located on the backside of the [place]. Limited parking is available off of [address]  Patric Dykes hours: Monday -Friday 8:30am-4:30pm  MobileShades.de    If immediate emergency, call 9-1-1, or Family Service of the [place] 24 hour crisis hotline at: 646-448-1872  Next [facility] [address], [place], [place] [address] [phone] **temporary housing for victims of domestic violence  [place]:  HELP, [place]  [address], [place],  [address] [phone]  http://helpincorporated.org  Monday-Friday, 8:30am-5:00pm

## 2021-01-20 NOTE — BH Specialist Note (Deleted)
Integrated Behavioral Health Follow Up In-Person Visit  MRN: [id number] Name: Holly Vazquez  Number of [place] Clinician visits: {IBH Number of Visits:21014052} Session Start time: 10:45***  Session End time: 11:15*** Total time: {IBH Total Time:21014050} minutes  Types of Service: {CHL AMB TYPE OF SERVICE:[phone]}  Interpretor:No. Interpretor Name and Language: n/a  Subjective: Mystique Bjelland is a 40 y.o. female accompanied by {Patient accompanied by:[phone]} Patient was referred by *** for ***. Patient reports the following symptoms/concerns: *** Duration of problem: ***; Severity of problem: {Mild/Moderate/Severe:20260}  Objective: Mood: {BHH MOOD:22306} and Affect: {BHH AFFECT:22307} Risk of harm to self or others: {CHL AMB BH Suicide Current Mental Status:21022748}  Life Context: Family and Social: *** School/Work: *** Self-Care: *** Life Changes: ***  Patient and/or Family's Strengths/Protective Factors: {CHL AMB BH PROTECTIVE [phone]}  Goals Addressed: Patient will: 1.  Reduce symptoms of: {IBH Symptoms:21014056}  2.  Increase knowledge and/or ability of: {IBH Patient Tools:21014057}  3.  Demonstrate ability to: {IBH Goals:21014053}  Progress towards Goals: {CHL AMB BH PROGRESS TOWARDS GOALS:[phone]}  Interventions: Interventions utilized:  {IBH Interventions:21014054} Standardized Assessments completed: {IBH Screening Tools:21014051}  Patient and/or Family Response: ***  Patient Centered Plan: Patient is on the following Treatment Plan(s): *** Assessment: Patient currently experiencing ***.   Patient may benefit from ***.  Plan: 1. Follow up with behavioral health clinician on : *** 2. Behavioral recommendations: *** 3. Referral(s): {IBH Referrals:21014055} 4. "From scale of 1-10, how likely are you to follow plan?": ***  Caroleen Hamman Harlene Petralia, LCSW

## 2021-01-25 ENCOUNTER — Ambulatory Visit: Payer: Medicaid Other

## 2021-02-02 ENCOUNTER — Encounter (HOSPITAL_COMMUNITY): Payer: Self-pay | Admitting: Obstetrics and Gynecology

## 2021-02-02 ENCOUNTER — Inpatient Hospital Stay (HOSPITAL_COMMUNITY)
Admission: AD | Admit: 2021-02-02 | Discharge: 2021-02-02 | Disposition: A | Discharge status: Home / Self Care | Payer: Medicaid Other | Attending: Obstetrics and Gynecology | Admitting: Obstetrics and Gynecology

## 2021-02-02 ENCOUNTER — Other Ambulatory Visit: Payer: Self-pay

## 2021-02-02 DIAGNOSIS — N611 Abscess of the breast and nipple: Secondary | ICD-10-CM | POA: Insufficient documentation

## 2021-02-02 MED ORDER — SULFAMETHOXAZOLE-TRIMETHOPRIM 800-160 MG PO TABS: 1.0000 | ORAL_TABLET | Freq: Two times a day (BID) | ORAL | 0 refills | Status: DC

## 2021-02-02 NOTE — Discharge Instructions (Signed)
I have sent in a prescription for antibiotic named Bactrim DS. You will need to take it twice a day for 14 days. Please take all the medication even if you start to feel better. You can take Ibuprofen and apply moist heat to the affected area as needed.

## 2021-02-02 NOTE — MAU Note (Signed)
Had an ingrown hair, been there a few months.  Wore a shirt that rubbed on it, started swelling and caused pain. Now has  nickle sized, red lump onside of left breast. No drainage or bleeding.  Was battling depression, so just put it off. Pt states is not pregnant.

## 2021-02-02 NOTE — MAU Provider Note (Signed)
Event Date/Time   First Provider Initiated Contact with Patient 02/02/21 1737      S [name] is a 40 y.o. [id number] non-pregnant female who presents to MAU today with complaint of having what she believes to be an ingrown hair that looked like a blackhead for a couple of months. She "mentioned it to Holly Vazquez at [facility] a couple of months ago as she was going out the door after doing my pap smear and she told me to stick a pin in the black head and it should be fine." She reports she never did that and "meant to" follow-up, but never did because she has "been battling depression and anxiety and just haven't taken the time to do anything else." Holly Vazquez prescribes my medicine for anxiety, so I didn't make another appointment for the bump on the side of my breast. She reports that she recently wore a latex shirt that rubbed the area. She reports now the area is red, swollen and in pain. She denies any drainage or bleeding. She has not tried to take anything for the pain. She has not tried to do anything to the area. She expresses being "very anxious that something is wrong."  O BP 128/68 (BP Location: Right Arm)   Pulse 89   Temp 98.7 F (37.1 C) (Oral)   Resp 20   Wt 63.8 kg   LMP 01/30/2021   SpO2 99%   BMI 23.76 kg/m  Physical Exam Vitals and nursing note reviewed.  Constitutional:      Appearance: Normal appearance. She is obese.  Cardiovascular:     Rate and Rhythm: Normal rate.  Chest:  Breasts:     Right: Normal.     Left: Skin change (x 2 months, redness, pain and swelling < 40months) present.        Comments: See pics below Skin:    General: Skin is warm and dry.     Findings: Erythema present.     Comments: Of abscess on LT breast  Neurological:     Mental Status: She is alert and oriented to person, place, and time.  Psychiatric:        Mood and Affect: Mood normal.        Behavior: Behavior normal.        Thought Content: Thought content normal.         Judgment: Judgment normal.           A Medical screening exam complete Breast abscess of female - Plan: Discharge patient *Consult with Holly Vazquez @ 1747 in [place] - notified of patient's complaints and assessments, tx plan transfer to [facility] for further evaluation - agrees with plan, ok to transfer to [facility], but will only make referral to breast center for an ultrasound and mammogram, but no intervention would be done in the [facility]  Consult with Holly Vazquez @ 1801 - notified of patient's complaints, assessments and recommendation by Holly Vazquez. Holly Vazquez recommends referring patient to general surgery and Rx Bactrim DS x 14 days  P Discharge from MAU in stable condition Rx for Bactrim DS 800-160 mg BID x 14 days Advised to apply moist heat to affected area prn and take Ibuprofen prn Patient given the option of transfer to [facility] for further evaluation or seek care in outpatient facility of choice OR be d/c'd and have [facility] schedule referral to general surgery Warning signs for worsening condition that would warrant emergency follow-up discussed Patient may return to [facility]  as needed   Holly Vazquez, [place] 02/02/2021 5:37 PM

## 2021-02-07 ENCOUNTER — Telehealth: Payer: Self-pay | Admitting: Lactation Services

## 2021-02-07 ENCOUNTER — Other Ambulatory Visit: Payer: Self-pay | Admitting: Lactation Services

## 2021-02-07 DIAGNOSIS — N611 Abscess of the breast and nipple: Secondary | ICD-10-CM

## 2021-02-07 NOTE — Telephone Encounter (Signed)
Error

## 2021-02-07 NOTE — Telephone Encounter (Signed)
Lilia Pro from [facility] called back and reports patient needs to be seen at the [facility] for I&D x 3 days first with ATB prior to being seen by surgeons per protocol.   Order placed to [place].

## 2021-02-07 NOTE — Progress Notes (Signed)
Referral placed for [facility]. Called and LM with Lilia Pro, Referral Coordinator that patient needs consult for breast abscess. Asked her to call the office if she has any questions or concerns.

## 2021-02-10 ENCOUNTER — Other Ambulatory Visit: Payer: Self-pay

## 2021-02-10 DIAGNOSIS — N611 Abscess of the breast and nipple: Secondary | ICD-10-CM

## 2021-02-11 MED ORDER — SULFAMETHOXAZOLE-TRIMETHOPRIM 800-160 MG PO TABS: 1.0000 | ORAL_TABLET | Freq: Two times a day (BID) | ORAL | 0 refills | Status: AC
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
## 2021-02-28 ENCOUNTER — Ambulatory Visit (INDEPENDENT_AMBULATORY_CARE_PROVIDER_SITE_OTHER): Payer: Medicaid Other | Admitting: Clinical

## 2021-02-28 DIAGNOSIS — F4321 Adjustment disorder with depressed mood: Secondary | ICD-10-CM

## 2021-02-28 DIAGNOSIS — F3281 Premenstrual dysphoric disorder: Secondary | ICD-10-CM | POA: Diagnosis not present

## 2021-02-28 DIAGNOSIS — Z658 Other specified problems related to psychosocial circumstances: Secondary | ICD-10-CM

## 2021-02-28 NOTE — Addendum Note (Signed)
Addended by: Vesta Mixer C on: 02/28/2021 10:13 AM   Modules accepted: Orders

## 2021-03-01 ENCOUNTER — Other Ambulatory Visit: Payer: Self-pay | Admitting: Family Medicine

## 2021-03-01 DIAGNOSIS — F419 Anxiety disorder, unspecified: Secondary | ICD-10-CM

## 2021-03-01 DIAGNOSIS — N611 Abscess of the breast and nipple: Secondary | ICD-10-CM

## 2021-03-01 MED ORDER — HYDROXYZINE PAMOATE 25 MG PO CAPS: 25.0000 mg | ORAL_CAPSULE | Freq: Three times a day (TID) | ORAL | 3 refills | Status: DC | PRN

## 2021-03-03 ENCOUNTER — Telehealth: Payer: Self-pay | Admitting: Clinical

## 2021-03-03 NOTE — Telephone Encounter (Signed)
Brief f/u to confirm pt has received medication refill; pt has received BH medication, and is taking as prescribed.

## 2021-04-11 ENCOUNTER — Other Ambulatory Visit: Payer: Self-pay

## 2021-04-11 ENCOUNTER — Other Ambulatory Visit (HOSPITAL_COMMUNITY)
Admission: RE | Admit: 2021-04-11 | Discharge: 2021-04-11 | Disposition: A | Discharge status: Home / Self Care | Payer: Medicaid Other | Source: Ambulatory Visit | Attending: Family Medicine | Admitting: Family Medicine

## 2021-04-11 ENCOUNTER — Ambulatory Visit (INDEPENDENT_AMBULATORY_CARE_PROVIDER_SITE_OTHER): Payer: Medicaid Other

## 2021-04-11 VITALS — Wt 138.7 lb

## 2021-04-11 DIAGNOSIS — N898 Other specified noninflammatory disorders of vagina: Secondary | ICD-10-CM | POA: Diagnosis present

## 2021-04-11 NOTE — Progress Notes (Signed)
Here today with a complaint of abnormal vaginal discharge. Reports more discharge than normal that is yellow in color. Self-swab instructions given and specimen obtained.   PHQ-9 and GAD-7 elevated; denies SI. Pt to follow up with Roselyn Reef. [place] office to schedule.   Apolonio Schneiders RN 04/12/21

## 2021-04-11 NOTE — Patient Instructions (Signed)
Natural Remedies for Bacterial Vaginosis   Option #1  1 Tbsp Fractitionated Coconut Oil  10 drops of Melaleuca (Tea Tree) Oil   Mix ingredients together well.  Soak 3-4 tampons (in applicators) in that mixture until all or mostly all mixture is soaked up into the tampons.  Insert 1 saturated tampon vaginally and wear overnight for 3-4 nights.     Option #2 (sometimes to be used in conjunction with option #1)  Fill tub with enough to cover lap/lower abdomen warm water.  Mix 1/2 cup of baking soda in water.  Soak in water/baking soda mixture for at least 20 minutes.  Be sure to swish water in between legs to get as much in vagina as possible.  This soak should be done after sexual intercourse and menstrual cycles.     GO WHITE:  Soap: UNSCENTED Dove (white box light green writing)  Laundry detergent (underwear)- Dreft or Arm n' Hammer unscented  WHITE 100% cotton panties (NOT just cotton crouch)  Sanitary napkin/panty liners: UNSCENTED.  If it doesn't SAY unscented it can have a scent/perfume    NO PERFUMES OR LOTIONS OR POTIONS in the vulvar area (may use regular KY)  Condoms: hypoallergenic only. Non dyed (no color)  Toilet papers: white only  Wash clothes: use a separate wash cloth. WHITE.  Wash in [address].   You can purchase Tea Tree Oil locally at:   [employer]  [address] [place] [address]   [employer]  [address]  [place] [address]   Advise that these alternatives will not replace the need to be evaluated if symptoms persist. You will need to seek care at an OB/GYN provider.

## 2021-04-12 LAB — CERVICOVAGINAL ANCILLARY ONLY
Bacterial Vaginitis (gardnerella): POSITIVE — AB
Candida Glabrata: NEGATIVE
Candida Vaginitis: POSITIVE — AB
Chlamydia: NEGATIVE
Comment: NEGATIVE
Comment: NEGATIVE
Comment: NEGATIVE
Comment: NEGATIVE
Comment: NEGATIVE
Comment: NORMAL
Neisseria Gonorrhea: NEGATIVE
Trichomonas: NEGATIVE

## 2021-04-12 NOTE — BH Specialist Note (Signed)
Integrated Behavioral Health via Telemedicine Visit  04/12/2021 Kyliee Ortego [id number]  Number of [place] visits: 1 Session Start time: 9:53  Session End time: 10:04 Total time:  11  Referring Provider: Darron Doom, MD Patient/Family location: [facility] Provider location: [facility] for [place] at [facility]  All persons participating in visit: Patient Al[place] and [place]   Types of Service: Telephone visit  I connected with [name] and/or [place]  via  Telephone or Video Enabled Telemedicine Application  (Video is Caregility application) and verified that I am speaking with the correct person using two identifiers. Discussed confidentiality: Yes   I discussed the limitations of telemedicine and the availability of in person appointments.  Discussed there is a possibility of technology failure and discussed alternative modes of communication if that failure occurs.  I discussed that engaging in this telemedicine visit, they consent to the provision of behavioral healthcare and the services will be billed under their insurance.  Patient and/or legal guardian expressed understanding and consented to Telemedicine visit: Yes   Presenting Concerns: Patient and/or family reports the following symptoms/concerns: Pt states her primary concern today is that "life is not getting any bettter", financial stress, feeling anxious over starting new, full-time  job this week and that  medication (Zoloft/Vistaril) at current dosages are not managing symptoms as well as at the beginning; sometimes takes 2 Vistaril at a time.  Duration of problem: Ongoing; Severity of problem: severe  Patient and/or Family's Strengths/Protective Factors: Social connections and Sense of purpose  Goals Addressed: Patient will:  Reduce symptoms of: anxiety, mood instability, and stress   Demonstrate ability to: Increase  healthy adjustment to current life circumstances and Increase motivation to adhere to plan of care  Progress towards Goals: Ongoing  Interventions: Interventions utilized:  Medication Monitoring and Supportive Reflection Standardized Assessments completed:  not given today  Patient and/or Family Response: Pt agrees to treatment plan today  Assessment: Patient currently experiencing PMDD, Psychosocial stress.   Patient may benefit from brief therapeutic intervention today.  Plan: Follow up with behavioral health clinician on : Call Roselyn Reef as needed at [phone] Behavioral recommendations:  -[place] tomorrow morning (Call today at [phone] to confirm walk-in hours) -Continue taking BH medication as prescribed; discuss with psychiatry any changes to medication Referral(s): Integrated Orthoptist (In Clinic) and [place] (LME/Outside Clinic)  I discussed the assessment and treatment plan with the patient and/or parent/guardian. They were provided an opportunity to ask questions and all were answered. They agreed with the plan and demonstrated an understanding of the instructions.   They were advised to call back or seek an in-person evaluation if the symptoms worsen or if the condition fails to improve as anticipated.  Caroleen Hamman Leonela Kivi, LCSW

## 2021-04-13 ENCOUNTER — Other Ambulatory Visit: Payer: Self-pay | Admitting: Medical

## 2021-04-13 DIAGNOSIS — B9689 Other specified bacterial agents as the cause of diseases classified elsewhere: Secondary | ICD-10-CM

## 2021-04-13 DIAGNOSIS — B379 Candidiasis, unspecified: Secondary | ICD-10-CM

## 2021-04-13 MED ORDER — FLUCONAZOLE 100 MG PO TABS: 100.0000 mg | ORAL_TABLET | Freq: Every day | ORAL | 0 refills | Status: DC

## 2021-04-13 MED ORDER — CLINDAMYCIN HCL 300 MG PO CAPS: 300.0000 mg | ORAL_CAPSULE | Freq: Two times a day (BID) | ORAL | 0 refills | Status: AC

## 2021-04-13 NOTE — Progress Notes (Signed)
Chart reviewed for nurse visit. Agree with plan of care.   Luvenia Redden, PA-C 04/13/2021 10:40 AM

## 2021-04-26 ENCOUNTER — Ambulatory Visit (INDEPENDENT_AMBULATORY_CARE_PROVIDER_SITE_OTHER): Payer: Medicaid Other | Admitting: Clinical

## 2021-04-26 DIAGNOSIS — F3281 Premenstrual dysphoric disorder: Secondary | ICD-10-CM | POA: Diagnosis not present

## 2021-04-26 DIAGNOSIS — Z658 Other specified problems related to psychosocial circumstances: Secondary | ICD-10-CM

## 2021-05-23 ENCOUNTER — Ambulatory Visit (INDEPENDENT_AMBULATORY_CARE_PROVIDER_SITE_OTHER): Payer: Medicaid Other | Admitting: *Deleted

## 2021-05-23 DIAGNOSIS — F419 Anxiety disorder, unspecified: Secondary | ICD-10-CM

## 2021-05-23 DIAGNOSIS — F3281 Premenstrual dysphoric disorder: Secondary | ICD-10-CM

## 2021-05-23 DIAGNOSIS — Z3A01 Less than 8 weeks gestation of pregnancy: Secondary | ICD-10-CM

## 2021-05-23 DIAGNOSIS — Z3200 Encounter for pregnancy test, result unknown: Secondary | ICD-10-CM

## 2021-05-23 DIAGNOSIS — O99341 Other mental disorders complicating pregnancy, first trimester: Secondary | ICD-10-CM

## 2021-05-23 DIAGNOSIS — O0990 Supervision of high risk pregnancy, unspecified, unspecified trimester: Secondary | ICD-10-CM | POA: Insufficient documentation

## 2021-05-23 DIAGNOSIS — Z3201 Encounter for pregnancy test, result positive: Secondary | ICD-10-CM

## 2021-05-23 DIAGNOSIS — O09529 Supervision of elderly multigravida, unspecified trimester: Secondary | ICD-10-CM | POA: Insufficient documentation

## 2021-05-23 LAB — POCT PREGNANCY, URINE: Preg Test, Ur: POSITIVE — AB

## 2021-05-23 MED ORDER — PRENATAL 27-0.8 MG PO TABS: 1.0000 | ORAL_TABLET | Freq: Every day | ORAL | 11 refills | Status: DC

## 2021-05-23 NOTE — Progress Notes (Signed)
Patient was assessed and managed by nursing staff during this encounter. I have reviewed the chart and agree with the documentation and plan. I have also made any necessary editorial changes.  Griffin Basil, MD 05/23/2021 12:54 PM

## 2021-05-23 NOTE — Progress Notes (Signed)
Holly Vazquez dropped off urine for pregnancy test which was positive. She reports LMP 04/22/21 . This makes her 75w3dwith EDD 01/27/22. I advised she start prenatal care and to start prenatal vitamins. [facility] I informed her I will message front desk and she may also give them a call. I sent in [facility] RX at her request. I reviewed meds she is taking and informed her I will fu with a provider re if  vistaril ok in pregnancy and let her know.  I gave her a list of providers per protocol. Shylee Durrett,RN

## 2021-05-23 NOTE — Patient Instructions (Signed)
Prenatal Care Providers           [facility]  [address] [phone]  [facility]   [address]  [phone]  [facility] [place]       [address] [phone]            [facility] [place]     [address] [phone]          [facility] [place]   [address] [phone]  [facility]  [address] [phone]     [facility]  [address]   [phone]     [facility]  Phone: [phone]  [facility]  Phone: [phone]  [facility] Phone: [phone]  [facility] Phone: [phone]  [facility] Phone: [phone]  [facility] Phone: [phone]  [facility]  Phone: [phone]  

## 2021-05-23 NOTE — Progress Notes (Signed)
Notified by Dr. Elgie Congo Vistaril not recommended in early pregnancy, recommend hold on until 12 weeks or later until after critical structures formed in baby.  May try benadryl. I called Holly Vazquez and informed her of these recommendations. She states she was given Zoloft and Vistaril by Dr. Kennon Rounds for PMDD and anxiety and it helped her stop smoking weed. She states she thinks she will be ok with just Zoloft until 12 weeks then add Vistaril. I asked her to let us know if this isn't working and at her new ob she can discuss in detail with a provider. She voices understanding. Holly Cdebaca,RN

## 2021-06-25 ENCOUNTER — Other Ambulatory Visit: Payer: Self-pay | Admitting: Family Medicine

## 2021-06-25 DIAGNOSIS — F419 Anxiety disorder, unspecified: Secondary | ICD-10-CM

## 2021-06-28 ENCOUNTER — Telehealth: Payer: Self-pay

## 2021-06-28 ENCOUNTER — Telehealth (INDEPENDENT_AMBULATORY_CARE_PROVIDER_SITE_OTHER): Payer: Medicaid Other

## 2021-06-28 DIAGNOSIS — O09521 Supervision of elderly multigravida, first trimester: Secondary | ICD-10-CM

## 2021-06-28 DIAGNOSIS — Z136 Encounter for screening for cardiovascular disorders: Secondary | ICD-10-CM

## 2021-06-28 DIAGNOSIS — O0990 Supervision of high risk pregnancy, unspecified, unspecified trimester: Secondary | ICD-10-CM

## 2021-06-28 DIAGNOSIS — Z3A00 Weeks of gestation of pregnancy not specified: Secondary | ICD-10-CM

## 2021-06-28 DIAGNOSIS — O09529 Supervision of elderly multigravida, unspecified trimester: Secondary | ICD-10-CM

## 2021-06-28 MED ORDER — BLOOD PRESSURE MONITORING DEVI: 1.0000 | 0 refills | Status: AC

## 2021-06-28 NOTE — Telephone Encounter (Signed)
2nd attempt to reach Pt, still no answer, left VM for her to come to Drs visit on 07/11/21 @ 1:55p.

## 2021-06-28 NOTE — Progress Notes (Signed)
Agree with A & P. 

## 2021-06-28 NOTE — Progress Notes (Signed)
New OB Intake  I connected with  Holly Vazquez on 06/28/21 at  9:15 AM EDT by telephone Video Visit and verified that I am speaking with the correct person using two identifiers. Nurse is located at [facility] and pt is located at home.  I discussed the limitations, risks, security and privacy concerns of performing an evaluation and management service by telephone and the availability of in person appointments. I also discussed with the patient that there may be a patient responsible charge related to this service. The patient expressed understanding and agreed to proceed.  I explained I am completing New OB Intake today. We discussed her EDD of 01/27/21 that is based on LMP of 04/22/21. Pt is G9/P4. I reviewed her allergies, medications, Medical/Surgical/OB history, and appropriate screenings. I informed her of [facility] services. Based on history, this is a/an  pregnancy complicated by gestational diabetes and pre-eclampsia .   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   MyChart/Babyscripts MyChart access verified. I explained pt will have some visits in office and some virtually. Babyscripts instructions given and order placed. Patient verifies receipt of registration text/e-mail. Account successfully created and app downloaded.  Blood Pressure Cuff  Blood pressure cuff ordered for patient to pick-up from [employer]. Explained after first prenatal appt pt will check weekly and document in 44.  Weight scale: Patient    have weight scale. Weight scale ordered for patient  to pick up form [facility].   Anatomy US Explained first scheduled [place] will be around 19 weeks. Anatomy US scheduled for 09/02/21 at 10:45a. Pt notified to arrive at 10:30a.  Labs Discussed Johnsie Cancel genetic screening with patient. Would like both Panorama and Horizon drawn at new OB visit. Routine prenatal labs needed.  Covid Vaccine Patient has covid vaccine.   Mother/ Baby Dyad Candidate?    If yes, offer as possibility  Informed patient of [facility] website  and placed link in her AVS.   Social Determinants of Health Food Insecurity: Patient denies food insecurity. WIC Referral: Patient is interested in referral to [facility].  Transportation: Patient denies transportation needs. Childcare: Discussed no children allowed at ultrasound appointments. Offered childcare services; patient declines childcare services at this time.  Send link to Pregnancy Navigators   Placed OB Box on problem list and updated  First visit review I reviewed new OB appt with pt. I explained she will have a pelvic exam, ob bloodwork with genetic screening, and PAP smear. Explained pt will be seen by Dr.Ervin at first visit; encounter routed to appropriate provider. Explained that patient will be seen by pregnancy navigator following visit with provider. [facility] information placed in AVS.   Bethanne Ginger, [place] 06/28/2021  9:58 AM

## 2021-06-28 NOTE — Telephone Encounter (Signed)
Called Pt to start New OB Intake thru My Chart, no answer, left VM on # ending [phone] & no answer on # ending [phone].

## 2021-07-11 ENCOUNTER — Encounter: Payer: Self-pay | Admitting: Obstetrics and Gynecology

## 2021-07-11 ENCOUNTER — Other Ambulatory Visit: Payer: Self-pay

## 2021-07-11 ENCOUNTER — Ambulatory Visit (INDEPENDENT_AMBULATORY_CARE_PROVIDER_SITE_OTHER): Payer: Medicaid Other | Admitting: Obstetrics and Gynecology

## 2021-07-11 VITALS — BP 123/68 | HR 73 | Wt 155.6 lb

## 2021-07-11 DIAGNOSIS — F3281 Premenstrual dysphoric disorder: Secondary | ICD-10-CM

## 2021-07-11 DIAGNOSIS — F419 Anxiety disorder, unspecified: Secondary | ICD-10-CM | POA: Diagnosis not present

## 2021-07-11 DIAGNOSIS — O09521 Supervision of elderly multigravida, first trimester: Secondary | ICD-10-CM | POA: Diagnosis not present

## 2021-07-11 DIAGNOSIS — Z5941 Food insecurity: Secondary | ICD-10-CM

## 2021-07-11 DIAGNOSIS — O0990 Supervision of high risk pregnancy, unspecified, unspecified trimester: Secondary | ICD-10-CM | POA: Diagnosis not present

## 2021-07-11 MED ORDER — PREPLUS 27-1 MG PO TABS: 1.0000 | ORAL_TABLET | Freq: Every day | ORAL | 13 refills | Status: AC

## 2021-07-11 MED ORDER — SERTRALINE HCL 50 MG PO TABS: 50.0000 mg | ORAL_TABLET | Freq: Every day | ORAL | 2 refills | Status: DC

## 2021-07-11 MED ORDER — ASPIRIN EC 81 MG PO TBEC: 81.0000 mg | DELAYED_RELEASE_TABLET | Freq: Every day | ORAL | 2 refills | Status: DC

## 2021-07-11 MED ORDER — ONDANSETRON 4 MG PO TBDP
4.0000 mg | ORAL_TABLET | Freq: Four times a day (QID) | ORAL | 0 refills | Status: DC | PRN
Start: 2021-07-11 — End: 2021-10-28

## 2021-07-11 NOTE — Progress Notes (Signed)
Subjective:  Carinne Brandenburger is a 40 y.o. G6P5006 at [redacted]w[redacted]d being seen today for her first prenatal visit. EDD by LMP. H/O anxiety/PMDD and takes Zoloft. .  She is currently monitored for the following issues for this high-risk pregnancy and has Anemia; Allergic urticaria; Allergic rhinitis; Pelvic pain; Cervical polyp; PMDD (premenstrual dysphoric disorder); Anxiety; Supervision of high risk pregnancy, antepartum; and AMA (advanced maternal age) multigravida 35+ on their problem list.  Patient reports nausea.  Contractions: Not present. Vag. Bleeding: None.  Movement: Absent. Denies leaking of fluid.   The following portions of the patient's history were reviewed and updated as appropriate: allergies, current medications, past family history, past medical history, past social history, past surgical history and problem list. Problem list updated.  Objective:   Vitals:   07/11/21 1428  BP: 123/68  Pulse: 73  Weight: 155 lb 9.6 oz (70.6 kg)    Fetal Status: Fetal Heart Rate (bpm): 169   Movement: Absent     General:  Alert, oriented and cooperative. Patient is in no acute distress.  Skin: Skin is warm and dry. No rash noted.   Cardiovascular: Normal heart rate noted  Respiratory: Normal respiratory effort, no problems with respiration noted  Abdomen: Soft, gravid, appropriate for gestational age. Pain/Pressure: Absent     Pelvic:  Cervical exam deferred        Extremities: Normal range of motion.  Edema: None  Mental Status: Normal mood and affect. Normal behavior. Normal judgment and thought content.   Urinalysis:      Assessment and Plan:  Pregnancy: G6P5006 at [redacted]w[redacted]d  1. Supervision of high risk pregnancy, antepartum Prenatal labs and care reviewed with pt Genetic testing discussed - AMBULATORY REFERRAL TO [facility] - Culture, OB Urine - Genetic Screening - CBC/D/Plt+RPR+Rh+ABO+RubIgG... - Hemoglobin A1c - TSH - Protein / creatinine ratio, urine - Cervicovaginal  ancillary only( [place]) - Prenatal Vit-Fe Fumarate-FA (PREPLUS) 27-1 MG TABS; Take 1 tablet by mouth daily.  Dispense: 30 tablet; Refill: 13 - aspirin EC 81 MG tablet; Take 1 tablet (81 mg total) by mouth daily. Take after 12 weeks for prevention of preeclampsia later in pregnancy  Dispense: 300 tablet; Refill: 2 - ondansetron (ZOFRAN ODT) 4 MG disintegrating tablet; Take 1 tablet (4 mg total) by mouth every 6 (six) hours as needed for nausea.  Dispense: 20 tablet; Refill: 0  2. Multigravida of advanced maternal age in first trimester Genetic testing today  3. Food insecurity  - AMBULATORY REFERRAL TO [place] [facility]  4. PMDD (premenstrual dysphoric disorder) Stable - sertraline (ZOLOFT) 50 MG tablet; Take 1 tablet (50 mg total) by mouth daily.  Dispense: 90 tablet; Refill: 2  5. Anxiety Stable - sertraline (ZOLOFT) 50 MG tablet; Take 1 tablet (50 mg total) by mouth daily.  Dispense: 90 tablet; Refill: 2  Preterm labor symptoms and general obstetric precautions including but not limited to vaginal bleeding, contractions, leaking of fluid and fetal movement were reviewed in detail with the patient. Please refer to After Visit Summary for other counseling recommendations.  Return in about 4 weeks (around 08/08/2021) for OB visit, face to face, any provider.   Chancy Milroy, MD

## 2021-07-11 NOTE — Patient Instructions (Signed)
First Trimester of Pregnancy °The first trimester of pregnancy starts on the first day of your last menstrual period until the end of week 12. This is months 1 through 3 of pregnancy. A week after a sperm fertilizes an egg, the egg will implant into the wall of the uterus and begin to develop into a baby. By the end of 12 weeks, all the baby's organs will be formed and the baby will be 2-3 inches in size. °Body changes during your first trimester °Your body goes through many changes during pregnancy. The changes vary and generally return to normal after your baby is born. °Physical changes °You may gain or lose weight. °Your breasts may begin to grow larger and become tender. The tissue that surrounds your nipples (areola) may become darker. °Dark spots or blotches (chloasma or mask of pregnancy) may develop on your face. °You may have changes in your hair. These can include thickening or thinning of your hair or changes in texture. °Health changes °You may feel nauseous, and you may vomit. °You may have heartburn. °You may develop headaches. °You may develop constipation. °Your gums may bleed and may be sensitive to brushing and flossing. °Other changes °You may tire easily. °You may urinate more often. °Your menstrual periods will stop. °You may have a loss of appetite. °You may develop cravings for certain kinds of food. °You may have changes in your emotions from day to day. °You may have more vivid and strange dreams. °Follow these instructions at home: °Medicines °Follow your health care provider's instructions regarding medicine use. Specific medicines may be either safe or unsafe to take during pregnancy. Do not take any medicines unless told to by your health care provider. °Take a prenatal vitamin that contains at least 600 micrograms (mcg) of folic acid. °Eating and drinking °Eat a healthy diet that includes fresh fruits and vegetables, whole grains, good sources of protein such as meat, eggs, or tofu,  and low-fat dairy products. °Avoid raw meat and unpasteurized juice, milk, and cheese. These carry germs that can harm you and your baby. °If you feel nauseous or you vomit: °Eat 4 or 5 small meals a day instead of 3 large meals. °Try eating a few soda crackers. °Drink liquids between meals instead of during meals. °You may need to take these actions to prevent or treat constipation: °Drink enough fluid to keep your urine pale yellow. °Eat foods that are high in fiber, such as beans, whole grains, and fresh fruits and vegetables. °Limit foods that are high in fat and processed sugars, such as fried or sweet foods. °Activity °Exercise only as directed by your health care provider. Most people can continue their usual exercise routine during pregnancy. Try to exercise for 30 minutes at least 5 days a week. °Stop exercising if you develop pain or cramping in the lower abdomen or lower back. °Avoid exercising if it is very hot or humid or if you are at high altitude. °Avoid heavy lifting. °If you choose to, you may have sex unless your health care provider tells you not to. °Relieving pain and discomfort °Wear a good support bra to relieve breast tenderness. °Rest with your legs elevated if you have leg cramps or low back pain. °If you develop bulging veins (varicose veins) in your legs: °Wear support hose as told by your health care provider. °Elevate your feet for 15 minutes, 3-4 times a day. °Limit salt in your diet. °Safety °Wear your seat belt at all times when driving   or riding in a car. °Talk with your health care provider if someone is verbally or physically abusive to you. °Talk with your health care provider if you are feeling sad or have thoughts of hurting yourself. °Lifestyle °Do not use hot tubs, steam rooms, or saunas. °Do not douche. Do not use tampons or scented sanitary pads. °Do not use herbal remedies, alcohol, illegal drugs, or medicines that are not approved by your health care provider. Chemicals  in these products can harm your baby. °Do not use any products that contain nicotine or tobacco, such as cigarettes, e-cigarettes, and chewing tobacco. If you need help quitting, ask your health care provider. °Avoid cat litter boxes and soil used by cats. These carry germs that can cause birth defects in the baby and possibly loss of the unborn baby (fetus) by miscarriage or stillbirth. °General instructions °During routine prenatal visits in the first trimester, your health care provider will do a physical exam, perform necessary tests, and ask you how things are going. Keep all follow-up visits. This is important. °Ask for help if you have counseling or nutritional needs during pregnancy. Your health care provider can offer advice or refer you to specialists for help with various needs. °Schedule a dentist appointment. At home, brush your teeth with a soft toothbrush. Floss gently. °Write down your questions. Take them to your prenatal visits. °Where to find more information °American Pregnancy Association: americanpregnancy.org °American College of Obstetricians and Gynecologists: acog.org/en/Womens%20Health/Pregnancy °Office on Women's Health: womenshealth.gov/pregnancy °Contact a health care provider if you have: °Dizziness. °A fever. °Mild pelvic cramps, pelvic pressure, or nagging pain in the abdominal area. °Nausea, vomiting, or diarrhea that lasts for 24 hours or longer. °A bad-smelling vaginal discharge. °Pain when you urinate. °Known exposure to a contagious illness, such as chickenpox, measles, Zika virus, HIV, or hepatitis. °Get help right away if you have: °Spotting or bleeding from your vagina. °Severe abdominal cramping or pain. °Shortness of breath or chest pain. °Any kind of trauma, such as from a fall or a car crash. °New or increased pain, swelling, or redness in an arm or leg. °Summary °The first trimester of pregnancy starts on the first day of your last menstrual period until the end of week  12 (months 1 through 3). °Eating 4 or 5 small meals a day rather than 3 large meals may help to relieve nausea and vomiting. °Do not use any products that contain nicotine or tobacco, such as cigarettes, e-cigarettes, and chewing tobacco. If you need help quitting, ask your health care provider. °Keep all follow-up visits. This is important. °This information is not intended to replace advice given to you by your health care provider. Make sure you discuss any questions you have with your health care provider. °Document Revised: 03/10/2020 Document Reviewed: 01/15/2020 °Elsevier Patient Education © 2022 [employer]. ° °

## 2021-07-12 LAB — TSH: TSH: 0.803 u[IU]/mL (ref 0.450–4.500)

## 2021-07-12 LAB — CBC/D/PLT+RPR+RH+ABO+RUBIGG...
Antibody Screen: NEGATIVE
Basophils Absolute: 0 10*3/uL (ref 0.0–0.2)
Basos: 0 %
EOS (ABSOLUTE): 0.2 10*3/uL (ref 0.0–0.4)
Eos: 2 %
HCV Ab: <0.1 s/co ratio (ref 0.0–0.9)
HIV Screen 4th Generation wRfx: NONREACTIVE
Hematocrit: 33.4 % — ABNORMAL LOW (ref 34.0–46.6)
Hemoglobin: 10.3 g/dL — ABNORMAL LOW (ref 11.1–15.9)
Hepatitis B Surface Ag: NEGATIVE
Immature Grans (Abs): 0 10*3/uL (ref 0.0–0.1)
Immature Granulocytes: 0 %
Lymphocytes Absolute: 2.9 10*3/uL (ref 0.7–3.1)
Lymphs: 28 %
MCH: 22.4 pg — ABNORMAL LOW (ref 26.6–33.0)
MCHC: 30.8 g/dL — ABNORMAL LOW (ref 31.5–35.7)
MCV: 73 fL — ABNORMAL LOW (ref 79–97)
Monocytes Absolute: 0.6 10*3/uL (ref 0.1–0.9)
Monocytes: 6 %
Neutrophils Absolute: 6.6 10*3/uL (ref 1.4–7.0)
Neutrophils: 64 %
Platelets: 252 10*3/uL (ref 150–450)
RBC: 4.6 x10E6/uL (ref 3.77–5.28)
RDW: 15.8 % — ABNORMAL HIGH (ref 11.7–15.4)
RPR Ser Ql: NONREACTIVE
Rh Factor: POSITIVE
Rubella Antibodies, IGG: 7.69 index (ref >0.99)
WBC: 10.4 10*3/uL (ref 3.4–10.8)

## 2021-07-12 LAB — PROTEIN / CREATININE RATIO, URINE
Creatinine, Urine: 122 mg/dL
Protein, Ur: 140.7 mg/dL
Protein/Creat Ratio: 1153 mg/g creat — ABNORMAL HIGH (ref 0–200)

## 2021-07-12 LAB — HEMOGLOBIN A1C
Est. average glucose Bld gHb Est-mCnc: 117 mg/dL
Hgb A1c MFr Bld: 5.7 % — ABNORMAL HIGH (ref 4.8–5.6)

## 2021-07-12 LAB — HCV INTERPRETATION

## 2021-07-13 LAB — CULTURE, OB URINE

## 2021-07-13 LAB — URINE CULTURE, OB REFLEX

## 2021-07-15 ENCOUNTER — Telehealth: Payer: Self-pay | Admitting: *Deleted

## 2021-07-15 MED ORDER — FERROUS SULFATE 325 (65 FE) MG PO TABS: 325.0000 mg | ORAL_TABLET | ORAL | 5 refills | Status: AC

## 2021-07-15 NOTE — Addendum Note (Signed)
Addended by: Langston Reusing on: 07/15/2021 10:49 AM   Modules accepted: Orders

## 2021-07-15 NOTE — Addendum Note (Signed)
Addended by: Langston Reusing on: 07/15/2021 11:59 AM   Modules accepted: Orders

## 2021-07-15 NOTE — Telephone Encounter (Addendum)
-----   Message from Chancy Milroy, MD sent at 07/13/2021 10:00 AM EDT ----- Please add CMP to blood collected this past Monday. Please schedule pt for a 1 hr glucola and a repeat UPC in the next 1-2 weeks. Also send in Fe supplement as per protocol Pt is aware Thanks Legrand Como   9/30  0940  Called pt and discussed test results as well as plan of care. She confirmed understanding of need for iron supplement and office appt for 2hr GTT and urine specimen.  Lab appt scheduled on 1/7 @ 0820.

## 2021-07-18 LAB — COMPREHENSIVE METABOLIC PANEL
ALT: 9 IU/L (ref 0–32)
AST: 13 IU/L (ref 0–40)
Albumin/Globulin Ratio: 1.6 (ref 1.2–2.2)
Albumin: 4.1 g/dL (ref 3.8–4.8)
Alkaline Phosphatase: 58 IU/L (ref 44–121)
BUN/Creatinine Ratio: 14 (ref 9–23)
BUN: 8 mg/dL (ref 6–20)
Bilirubin Total: <0.2 mg/dL (ref 0.0–1.2)
CO2: 14 mmol/L — ABNORMAL LOW (ref 20–29)
Calcium: 9.3 mg/dL (ref 8.7–10.2)
Chloride: 102 mmol/L (ref 96–106)
Creatinine, Ser: 0.58 mg/dL (ref 0.57–1.00)
Globulin, Total: 2.5 g/dL (ref 1.5–4.5)
Glucose: 90 mg/dL (ref 70–99)
Potassium: 3.9 mmol/L (ref 3.5–5.2)
Sodium: 137 mmol/L (ref 134–144)
Total Protein: 6.6 g/dL (ref 6.0–8.5)
eGFR: 119 mL/min/{1.73_m2} (ref >59)

## 2021-07-18 LAB — SPECIMEN STATUS REPORT

## 2021-07-22 ENCOUNTER — Other Ambulatory Visit: Payer: Self-pay

## 2021-07-22 ENCOUNTER — Other Ambulatory Visit: Payer: Medicaid Other

## 2021-07-22 DIAGNOSIS — O0990 Supervision of high risk pregnancy, unspecified, unspecified trimester: Secondary | ICD-10-CM

## 2021-07-23 LAB — GLUCOSE TOLERANCE, 2 HOURS W/ 1HR
Glucose, 1 hour: 138 mg/dL (ref 65–179)
Glucose, 2 hour: 100 mg/dL (ref 65–152)
Glucose, Fasting: 82 mg/dL (ref 65–91)

## 2021-07-24 LAB — PROTEIN / CREATININE RATIO, URINE
Creatinine, Urine: 231.6 mg/dL
Protein, Ur: 18.4 mg/dL
Protein/Creat Ratio: 79 mg/g creat (ref 0–200)

## 2021-08-01 ENCOUNTER — Telehealth: Payer: Self-pay | Admitting: Lactation Services

## 2021-08-01 NOTE — Telephone Encounter (Signed)
Called patient with results of [facility] Carrier Screening. Results show that patient is carrier for Alpha Thal and increased carrier risk for SMA.   Patient did not answer. Will send My Chart Message.

## 2021-08-02 ENCOUNTER — Encounter: Payer: Self-pay | Admitting: General Practice

## 2021-08-05 ENCOUNTER — Encounter: Payer: Self-pay | Admitting: Obstetrics and Gynecology

## 2021-08-05 DIAGNOSIS — D563 Thalassemia minor: Secondary | ICD-10-CM | POA: Insufficient documentation

## 2021-08-08 ENCOUNTER — Other Ambulatory Visit: Payer: Self-pay

## 2021-08-08 ENCOUNTER — Ambulatory Visit (INDEPENDENT_AMBULATORY_CARE_PROVIDER_SITE_OTHER): Payer: Medicaid Other | Admitting: Obstetrics and Gynecology

## 2021-08-08 VITALS — BP 114/60 | HR 72 | Wt 154.0 lb

## 2021-08-08 DIAGNOSIS — O09522 Supervision of elderly multigravida, second trimester: Secondary | ICD-10-CM

## 2021-08-08 DIAGNOSIS — O0990 Supervision of high risk pregnancy, unspecified, unspecified trimester: Secondary | ICD-10-CM | POA: Diagnosis not present

## 2021-08-08 DIAGNOSIS — F1210 Cannabis abuse, uncomplicated: Secondary | ICD-10-CM | POA: Insufficient documentation

## 2021-08-08 DIAGNOSIS — D563 Thalassemia minor: Secondary | ICD-10-CM

## 2021-08-08 DIAGNOSIS — Z3A15 15 weeks gestation of pregnancy: Secondary | ICD-10-CM

## 2021-08-08 NOTE — Progress Notes (Deleted)
   PRENATAL VISIT NOTE  Subjective:  Holly Vazquez is a 40 y.o. G6P5006 at [redacted]w[redacted]d being seen today for ongoing prenatal care.  She is currently monitored for the following issues for this low-risk pregnancy and has Anemia; Allergic urticaria; Allergic rhinitis; Pelvic pain; Cervical polyp; PMDD (premenstrual dysphoric disorder); Anxiety; Supervision of high risk pregnancy, antepartum; AMA (advanced maternal age) multigravida 23+; Alpha thalassemia silent carrier; and [redacted] weeks gestation of pregnancy on their problem list.  Patient doing well with no acute concerns today. She reports no complaints.  Contractions: Not present. Vag. Bleeding: None.   . Denies leaking of fluid.   The following portions of the patient's history were reviewed and updated as appropriate: allergies, current medications, past family history, past medical history, past social history, past surgical history and problem list. Problem list updated.  Objective:   Vitals:   08/08/21 1122  BP: 114/60  Pulse: 72  Weight: 154 lb (69.9 kg)    Fetal Status: Fetal Heart Rate (bpm): 160         General:  Alert, oriented and cooperative. Patient is in no acute distress.  Skin: Skin is warm and dry. No rash noted.   Cardiovascular: Normal heart rate noted  Respiratory: Normal respiratory effort, no problems with respiration noted  Abdomen: Soft, gravid, appropriate for gestational age.  Pain/Pressure: Absent     Pelvic: Cervical exam deferred        Extremities: Normal range of motion.  Edema: None  Mental Status:  Normal mood and affect. Normal behavior. Normal judgment and thought content.   Assessment and Plan:  Pregnancy: G6P5006 at 101w3d  1. [redacted] weeks gestation of pregnancy   2. Supervision of high risk pregnancy, antepartum Continue routine care   - AFP, Serum, Open Spina Bifida  3. Multigravida of advanced maternal age in second trimester ***  4. Alpha thalassemia silent carrier ***  {Blank  single:19197::"Term","Preterm"} labor symptoms and general obstetric precautions including but not limited to vaginal bleeding, contractions, leaking of fluid and fetal movement were reviewed in detail with the patient.  Please refer to After Visit Summary for other counseling recommendations.   Return in about 4 weeks (around 09/05/2021) for ROB.   Lynnda Shields, MD Faculty Attending Center for [facility]

## 2021-08-08 NOTE — Progress Notes (Signed)
   PRENATAL VISIT NOTE  Subjective:  Holly Vazquez is a 40 y.o. G6P5006 at [redacted]w[redacted]d being seen today for ongoing prenatal care.  She is currently monitored for the following issues for this low-risk pregnancy and has Anemia; Allergic urticaria; Allergic rhinitis; Pelvic pain; Cervical polyp; PMDD (premenstrual dysphoric disorder); Anxiety; Supervision of high risk pregnancy, antepartum; AMA (advanced maternal age) multigravida 63+; Alpha thalassemia silent carrier; [redacted] weeks gestation of pregnancy; and Tetrahydrocannabinol (THC) use disorder, mild, abuse on their problem list.  Patient doing well with no acute concerns today. She reports no complaints.  Contractions: Not present. Vag. Bleeding: None.   . Denies leaking of fluid.   The following portions of the patient's history were reviewed and updated as appropriate: allergies, current medications, past family history, past medical history, past social history, past surgical history and problem list. Problem list updated.  Objective:   Vitals:   08/08/21 1122  BP: 114/60  Pulse: 72  Weight: 154 lb (69.9 kg)    Fetal Status: Fetal Heart Rate (bpm): 160         General:  Alert, oriented and cooperative. Patient is in no acute distress.  Skin: Skin is warm and dry. No rash noted.   Cardiovascular: Normal heart rate noted  Respiratory: Normal respiratory effort, no problems with respiration noted  Abdomen: Soft, gravid, appropriate for gestational age.  Pain/Pressure: Absent     Pelvic: Cervical exam deferred        Extremities: Normal range of motion.  Edema: None  Mental Status:  Normal mood and affect. Normal behavior. Normal judgment and thought content.   Assessment and Plan:  Pregnancy: G6P5006 at [redacted]w[redacted]d  1. [redacted] weeks gestation of pregnancy   2. Supervision of high risk pregnancy, antepartum Continue routine care, growth scan pending  - AFP, Serum, Open Spina Bifida  3. Multigravida of advanced maternal age in second  trimester   4. Alpha thalassemia silent carrier   5. Tetrahydrocannabinol (THC) use disorder, mild, abuse Pt smokes 6 "blunts" a day.  She is experiencing life stress due to numerous children and pending divorce.  Pt has already spoken to counselor, but may need more defined care through behavorial services.  Discussed compromise to decrease THC use.  Pt to be down to 3 THC cigs by thanksgiving, 1 cig/day by Christmas and completely cease marijuana use by New Year's Day.  Pt agrees to these goals.  Pt also concerned about PMDD.  Advise drospirinone OCP after delivery or continuous therapy with menses q 3 months to treat this issue.  Pt is aware she would not be advised to breast feed if she uses this option.  Preterm labor symptoms and general obstetric precautions including but not limited to vaginal bleeding, contractions, leaking of fluid and fetal movement were reviewed in detail with the patient.  Please refer to After Visit Summary for other counseling recommendations.   Return in about 4 weeks (around 09/05/2021) for ROB.   Lynnda Shields, MD Faculty Attending Center for [facility]

## 2021-08-10 LAB — AFP, SERUM, OPEN SPINA BIFIDA
AFP MoM: 0.98
AFP Value: 34.4 ng/mL
Gest. Age on Collection Date: 15.4 weeks
Maternal Age At EDD: 38.3 yr
OSBR Risk 1 IN: 10000
Test Results:: NEGATIVE
Weight: 154 [lb_av]

## 2021-08-16 ENCOUNTER — Other Ambulatory Visit: Payer: Self-pay | Admitting: Family Medicine

## 2021-08-16 DIAGNOSIS — F419 Anxiety disorder, unspecified: Secondary | ICD-10-CM

## 2021-09-02 ENCOUNTER — Ambulatory Visit: Payer: Medicaid Other | Attending: Obstetrics and Gynecology

## 2021-09-02 ENCOUNTER — Other Ambulatory Visit: Payer: Self-pay | Admitting: *Deleted

## 2021-09-02 ENCOUNTER — Other Ambulatory Visit: Payer: Self-pay

## 2021-09-02 ENCOUNTER — Ambulatory Visit: Payer: Medicaid Other | Admitting: *Deleted

## 2021-09-02 VITALS — BP 132/78 | HR 68

## 2021-09-02 DIAGNOSIS — O09522 Supervision of elderly multigravida, second trimester: Secondary | ICD-10-CM | POA: Diagnosis present

## 2021-09-02 DIAGNOSIS — O09521 Supervision of elderly multigravida, first trimester: Secondary | ICD-10-CM | POA: Diagnosis not present

## 2021-09-02 DIAGNOSIS — O0990 Supervision of high risk pregnancy, unspecified, unspecified trimester: Secondary | ICD-10-CM | POA: Insufficient documentation

## 2021-09-02 DIAGNOSIS — F1210 Cannabis abuse, uncomplicated: Secondary | ICD-10-CM

## 2021-09-02 DIAGNOSIS — D563 Thalassemia minor: Secondary | ICD-10-CM

## 2021-09-05 ENCOUNTER — Encounter (HOSPITAL_COMMUNITY): Payer: Self-pay | Admitting: Registered Nurse

## 2021-09-05 ENCOUNTER — Ambulatory Visit (HOSPITAL_COMMUNITY)
Admission: EM | Admit: 2021-09-05 | Discharge: 2021-09-05 | Disposition: A | Discharge status: Home / Self Care | Payer: Medicaid Other | Attending: Registered Nurse | Admitting: Registered Nurse

## 2021-09-05 DIAGNOSIS — Z3A00 Weeks of gestation of pregnancy not specified: Secondary | ICD-10-CM | POA: Insufficient documentation

## 2021-09-05 DIAGNOSIS — F411 Generalized anxiety disorder: Secondary | ICD-10-CM | POA: Diagnosis present

## 2021-09-05 DIAGNOSIS — F4323 Adjustment disorder with mixed anxiety and depressed mood: Secondary | ICD-10-CM | POA: Diagnosis present

## 2021-09-05 DIAGNOSIS — O99340 Other mental disorders complicating pregnancy, unspecified trimester: Secondary | ICD-10-CM | POA: Insufficient documentation

## 2021-09-05 DIAGNOSIS — O99320 Drug use complicating pregnancy, unspecified trimester: Secondary | ICD-10-CM | POA: Insufficient documentation

## 2021-09-05 DIAGNOSIS — F419 Anxiety disorder, unspecified: Secondary | ICD-10-CM | POA: Diagnosis present

## 2021-09-05 DIAGNOSIS — F1290 Cannabis use, unspecified, uncomplicated: Secondary | ICD-10-CM | POA: Diagnosis present

## 2021-09-05 DIAGNOSIS — Z635 Disruption of family by separation and divorce: Secondary | ICD-10-CM | POA: Insufficient documentation

## 2021-09-05 NOTE — Discharge Summary (Signed)
Holly Vazquez to be D/C'd Home per NP order. Discussed with the patient and all questions fully answered. An After Visit Summary was printed and given to the patient. Patient escorted out and D/C home via private auto.  [name]  09/05/2021 7:18 PM
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
## 2021-09-05 NOTE — Discharge Instructions (Addendum)
Follow-up with your OBGYN
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
## 2021-09-06 ENCOUNTER — Other Ambulatory Visit: Payer: Self-pay

## 2021-09-06 ENCOUNTER — Ambulatory Visit (HOSPITAL_COMMUNITY): Payer: Medicaid Other | Admitting: Licensed Clinical Social Worker

## 2021-09-06 ENCOUNTER — Encounter (HOSPITAL_COMMUNITY): Payer: Self-pay

## 2021-09-06 ENCOUNTER — Ambulatory Visit (INDEPENDENT_AMBULATORY_CARE_PROVIDER_SITE_OTHER): Payer: Medicaid Other | Admitting: Licensed Clinical Social Worker

## 2021-09-06 DIAGNOSIS — F411 Generalized anxiety disorder: Secondary | ICD-10-CM

## 2021-09-06 DIAGNOSIS — F332 Major depressive disorder, recurrent severe without psychotic features: Secondary | ICD-10-CM

## 2021-09-07 ENCOUNTER — Ambulatory Visit (INDEPENDENT_AMBULATORY_CARE_PROVIDER_SITE_OTHER): Payer: Medicaid Other | Admitting: Family Medicine

## 2021-09-07 ENCOUNTER — Telehealth (HOSPITAL_COMMUNITY): Payer: Self-pay

## 2021-09-07 ENCOUNTER — Encounter: Payer: Self-pay | Admitting: Family Medicine

## 2021-09-07 VITALS — BP 136/87 | HR 90 | Wt 138.6 lb

## 2021-09-07 DIAGNOSIS — O09529 Supervision of elderly multigravida, unspecified trimester: Secondary | ICD-10-CM

## 2021-09-07 DIAGNOSIS — R030 Elevated blood-pressure reading, without diagnosis of hypertension: Secondary | ICD-10-CM

## 2021-09-07 DIAGNOSIS — O0990 Supervision of high risk pregnancy, unspecified, unspecified trimester: Secondary | ICD-10-CM

## 2021-09-07 DIAGNOSIS — F1210 Cannabis abuse, uncomplicated: Secondary | ICD-10-CM

## 2021-09-07 DIAGNOSIS — Z3A19 19 weeks gestation of pregnancy: Secondary | ICD-10-CM

## 2021-09-07 DIAGNOSIS — F32A Depression, unspecified: Secondary | ICD-10-CM

## 2021-09-07 DIAGNOSIS — F419 Anxiety disorder, unspecified: Secondary | ICD-10-CM

## 2021-09-07 DIAGNOSIS — R634 Abnormal weight loss: Secondary | ICD-10-CM
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
## 2021-09-07 NOTE — Progress Notes (Signed)
PRENATAL VISIT NOTE  Subjective:  Holly Vazquez is a 40 y.o. G6P5006 at [redacted]w[redacted]d being seen today for ongoing prenatal care.  She is currently monitored for the following issues for this high-risk pregnancy and has Anemia; Allergic urticaria; Allergic rhinitis; Pelvic pain; Cervical polyp; PMDD (premenstrual dysphoric disorder); Anxiety; Supervision of high risk pregnancy, antepartum; AMA (advanced maternal age) multigravida 67+; Alpha thalassemia silent carrier; [redacted] weeks gestation of pregnancy; Tetrahydrocannabinol (THC) use disorder, mild, abuse; Adjustment disorder with mixed anxiety and depressed mood; and Cannabis use disorder on their problem list.  Patient reports decreased appetite since getting the flu a couple weekends ago. She is feeling better overall from her illness but her appetite has not returned to normal still. She is eating only a few bites of her meals and then gets full. She has started drinking smoothies more to help her get some nutrition in. She is also smoking much less marijuana but states she still smokes a little bit to help her eat. She is not nauseated or vomiting, but just feels like she gets full too quickly. Contractions: Not present. Vag. Bleeding: None.  Movement: Present. Denies leaking of fluid.   The following portions of the patient's history were reviewed and updated as appropriate: allergies, current medications, past family history, past medical history, past social history, past surgical history and problem list.   Objective:   Vitals:   09/07/21 1108 09/07/21 1135  BP: (!) 127/91 136/87  Pulse: 83 90  Weight: 138 lb 9.6 oz (62.9 kg)    Wt Readings from Last 3 Encounters:  09/07/21 138 lb 9.6 oz (62.9 kg)  08/08/21 154 lb (69.9 kg)  07/11/21 155 lb 9.6 oz (70.6 kg)   Fetal Status: Fetal Heart Rate (bpm): 146   Movement: Present  General:  Alert, oriented and cooperative. Patient is in no acute distress.  Skin: Skin is warm and dry. No rash  noted.   Cardiovascular: Normal heart rate noted.  Respiratory: Normal respiratory effort, no problems with respiration noted.  Abdomen: Soft, gravid, appropriate for gestational age.      Pelvic: Cervical exam deferred.  Extremities: Normal range of motion.  Edema: None.  Mental Status: Normal mood and affect. Normal behavior. Normal judgment and thought content. PHQ-9 23, GAD-7 21.   Assessment and Plan:  Pregnancy: G6P5006 at [redacted]w[redacted]d  1. Supervision of high risk pregnancy, antepartum 2. [redacted] weeks gestation of pregnancy Progressing well. Normal FHT. Recent anatomy US reviewed with normal growth. Follow up scheduled to complete full anatomy. Due to additional issues addressed today, will keep closer follow up in 2 weeks to monitor weight.   3. Antepartum multigravida of advanced maternal age Normal cell free DNA testing. No obvious anomalies on [place].  4. Tetrahydrocannabinol (THC) use disorder, mild, abuse Smoking less and congratulated patient in her efforts. Encouraged her to continue to limit her marijuana use with goal to completely discontinue. Discussed that we can manage weight loss in other ways with her diet that are better for her than marijuana use. Patient will continue to do her best with this. Plan to reassess next visit.   5. Weight loss Patient has had 14 lb weight loss since last visit after recent flu illness. Able to drink smoothies and have small bites of meals. Counseled today on eating small, more frequent meals/snacks for now over larger meals until her appetite returns to normal. Encouraged high protein foods. Discussed that smoothies are a great option to help get her vegetables and fruits in  with added protein. Nutrition referral placed and patient given bag from food pantry today. Normal recent fetal growth. Will maintain close follow up and reassess in 2 weeks.  - Ambulatory referral to Nutrition and Diabetic Education  6. Anxiety and depression Stable though  symptoms still poorly controlled due to life stressors including current divorce. GAD-7 and PHQ-9 high scoring today. Patient feels that her [place] visits are very helpful in teaching her ways to cope with her stress. Taking Zoloft and Hydroxyzine as needed. Patient would like to increase the frequency of her [facility] visits. Will try to see if we can schedule next appointment on the same day as her next prenatal visit. No safety concerns today. If symptoms worsen, would recommend increasing Zoloft dose. Will continue to monitor.   7. Elevated BP without diagnosis of hypertension Mildly elevated initial BP today. Borderline on repeat measurement. Recommended obtaining baseline CBC, CMP, UPC today. Patient would like to do this next visit instead which is reasonable. No other symptoms. Denies hx of HTN. Will monitor and reassess in 2 weeks with labs at that time.   Preterm labor symptoms and general obstetric precautions including but not limited to vaginal bleeding, contractions, leaking of fluid and fetal movement were reviewed in detail with the patient.  Please refer to After Visit Summary for other counseling recommendations.   Return in about 2 weeks (around 09/21/2021) for follow up HR OB visit and weight check.  Future Appointments  Date Time Provider [place]  09/21/2021  1:15 PM Griffin Basil, MD [facility]  09/22/2021  1:45 PM [facility]  09/30/2021  7:30 AM [facility] NURSE [facility]  09/30/2021  7:45 AM [facility] US5 [facility]  10/20/2021  2:00 PM Pashayan, Redgie Grayer, MD [facility] None  10/25/2021 11:00 AM Hermine Messick, LCSW [facility] None  11/14/2021  8:00 AM Hermine Messick, LCSW [facility] None  11/28/2021 10:00 AM Hermine Messick, LCSW [facility] None    Genia Del, MD

## 2021-09-07 NOTE — BH Assessment (Signed)
Care Management - Follow Up Discharges   Writer attempted to make contact with patient today and was unsuccessful.  Writer left a HIPPA compliant voice message.   Per chart review, patient had a completed a therapy appointment  with Gwyneth Revels, LCSW and has a follow up appointment on 10-20-2021.

## 2021-09-12 ENCOUNTER — Telehealth (HOSPITAL_COMMUNITY): Payer: Self-pay | Admitting: Professional
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
## 2021-09-21 ENCOUNTER — Other Ambulatory Visit: Payer: Self-pay

## 2021-09-21 ENCOUNTER — Ambulatory Visit (INDEPENDENT_AMBULATORY_CARE_PROVIDER_SITE_OTHER): Payer: Medicaid Other | Admitting: Obstetrics and Gynecology

## 2021-09-21 VITALS — BP 124/76 | HR 80 | Wt 141.8 lb

## 2021-09-21 DIAGNOSIS — O09522 Supervision of elderly multigravida, second trimester: Secondary | ICD-10-CM

## 2021-09-21 DIAGNOSIS — Z5941 Food insecurity: Secondary | ICD-10-CM | POA: Insufficient documentation

## 2021-09-21 DIAGNOSIS — F419 Anxiety disorder, unspecified: Secondary | ICD-10-CM

## 2021-09-21 DIAGNOSIS — D563 Thalassemia minor: Secondary | ICD-10-CM

## 2021-09-21 DIAGNOSIS — O0990 Supervision of high risk pregnancy, unspecified, unspecified trimester: Secondary | ICD-10-CM

## 2021-09-21 DIAGNOSIS — Z3A21 21 weeks gestation of pregnancy: Secondary | ICD-10-CM | POA: Insufficient documentation

## 2021-09-21 DIAGNOSIS — F1210 Cannabis abuse, uncomplicated: Secondary | ICD-10-CM

## 2021-09-21 DIAGNOSIS — F3281 Premenstrual dysphoric disorder: Secondary | ICD-10-CM

## 2021-09-21 NOTE — Patient Instructions (Signed)
Unisom for sleep  (doxylamine)  For colds and allergies  Any anti-histamine including benadryl, allegra, claritin, etc.  Sudafed but not phenylephrine  Mucinex  Robitussin  For Reflux/heartburn  Pepcid Zantac Tums Prilosec Prevacid  For yeast infections  Monistat  For constipation  Colace  For minor aches and pains  Tylenol-do not take more than 4000mg  in 24 hours. Therma-care or like heat packs

## 2021-09-22 ENCOUNTER — Ambulatory Visit (INDEPENDENT_AMBULATORY_CARE_PROVIDER_SITE_OTHER): Payer: Medicaid Other | Admitting: Clinical

## 2021-09-22 DIAGNOSIS — F4323 Adjustment disorder with mixed anxiety and depressed mood: Secondary | ICD-10-CM

## 2021-09-22 DIAGNOSIS — Z658 Other specified problems related to psychosocial circumstances: Secondary | ICD-10-CM

## 2021-09-22 NOTE — Patient Instructions (Signed)
[facility] [address] , [place] [address] [phone] (main office) [phone] (Asuncion Shibata's office)  [email or web address]       BRAINSTORMING  Develop a Plan Goals: Provide a way to start conversation about your new life with a baby Assist parents in recognizing and using resources within their reach Help pave the way before birth for an easier period of transition afterwards.  Make a list of the following information to keep in a central location: Full name of Mom and Partner: _____________________________________________ Baby's full name and Date of Birth: ___________________________________________ Home Address: ___________________________________________________________ ________________________________________________________________________ Home Phone: ____________________________________________________________ Parents' cell numbers: _____________________________________________________ ________________________________________________________________________ Name and contact info for OB: ______________________________________________ Name and contact info for Pediatrician:________________________________________ Contact info for Lactation Consultants: ________________________________________  REST and SLEEP *You each need at least 4-5 hours of uninterrupted sleep every day. Write specific names and contact information.* How are you going to rest in the postpartum period? While partner's home? When partner returns to work? When you both return to work? Where will your baby sleep? Who is available to help during the day? Evening? Night? Who could move in for a period to help support you? What are some ideas to help you get enough  sleep? __________________________________________________________________________________________________________________________________________________________________________________________________________________________________________ NUTRITIOUS FOOD AND DRINK *Plan for meals before your baby is born so you can have healthy food to eat during the immediate postpartum period.* Who will look after breakfast? Lunch? Dinner? List names and contact information. Brainstorm quick, healthy ideas for each meal. What can you do before baby is born to prepare meals for the postpartum period? How can others help you with meals? Which grocery stores provide online shopping and delivery? Which restaurants offer take-out or delivery options? ______________________________________________________________________________________________________________________________________________________________________________________________________________________________________________________________________________________________________________________________________________________________________________________________________  CARE FOR MOM *It's important that mom is cared for and pampered in the postpartum period. Remember, the most important ways new mothers need care are: sleep, nutrition, gentle exercise, and time off.* Who can come take care of mom during this period? Make a list of people with their contact information. List some activities that make you feel cared for, rested, and energized? Who can make sure you have opportunities to do these things? Does mom have a space of her very own within your home that's just for her? Make a "Mama Cave" where she can be comfortable, rest, and renew herself  daily. ______________________________________________________________________________________________________________________________________________________________________________________________________________________________________________________________________________________________________________________________________________________________________________________________________    CARE FOR AND FEEDING BABY *Knowledgeable and encouraging people will offer the best support with regard to feeding your baby.* Educate yourself and choose the best feeding option for your baby. Make a list of people who will guide, support, and be a resource for you as your care for and feed your baby. (Friends that have breastfed or are currently breastfeeding, lactation consultants, breastfeeding support groups, etc.) Consider a postpartum doula. (These websites can give you information: dona.org & padanc.org) Seek out local breastfeeding resources like the breastfeeding support group at [facility] or [employer]. ______________________________________________________________________________________________________________________________________________________________________________________________________________________________________________________________________________________________________________________________________________________________________________________________________  CHORES AND ERRANDS Who can help with a thorough cleaning before baby is born? Make a list of people who will help with housekeeping and chores, like laundry, light cleaning, dishes, bathrooms, etc. Who can run some errands for you? What can you do to make sure you are stocked with basic supplies before baby is born? Who is going to do the  shopping? ______________________________________________________________________________________________________________________________________________________________________________________________________________________________________________________________________________________________________________________________________________________________________________________________________     Family Adjustment *Nurture yourselves.it helps parents be more loving and allows for better bonding with their child.* What sorts of things do   doing together? Which activities help you to connect and strengthen your relationship? Make a list of those things. Make a list of people whom you trust to care for your baby so you can have some time together as a couple. What types of things help partner feel connected to Mom? Make a list. What needs will partner have in order to bond with baby? Other children? Who will care for them when you go into labor and while you are in the hospital? Think about what the needs of your older children might be. Who can help you meet those needs? In what ways are you helping them prepare for bringing baby home? List some specific strategies you have for family adjustment. _______________________________________________________________________________________________________________________________________________________________________________________________________________________________________________________________________________________________________________________________________________  SUPPORT *Someone who can empathize with experiences normalizes your problems and makes them more bearable.* Make a list of other friends, neighbors, and/or co-workers you know with infants (and small children, if applicable) with whom you can connect. Make a list of local or online support groups, mom groups, etc. in which you can be  involved. ______________________________________________________________________________________________________________________________________________________________________________________________________________________________________________________________________________________________________________________________________________________________________________________________________  Childcare Plans Investigate and plan for childcare if mom is returning to work. Talk about mom's concerns about her transition back to work. Talk about partner's concerns regarding this transition.  Mental Health *Your mental health is one of the highest priorities for a pregnant or postpartum mom.* 1 in 5 women experience anxiety and/or depression from the time of conception through the first year after birth. Postpartum Mood Disorders are the #1 complication of pregnancy and childbirth and the suffering experienced by these mothers is not necessary! These illnesses are temporary and respond well to treatment, which often includes self-care, social support, talk therapy, and medication when needed. Women experiencing anxiety and depression often say things like: "I'm supposed to be happy.why do I feel so sad?", "Why can't I snap out of it?", "I'm having thoughts that scare me." There is no need to be embarrassed if you are feeling these symptoms: Overwhelmed, anxious, angry, sad, guilty, irritable, hopeless, exhausted but can't sleep You are NOT alone. You are NOT to blame. With help, you WILL be well. Where can I find help? Medical professionals such as your OB, midwife, gynecologist, family practitioner, primary care provider, pediatrician, or mental health providers; [facility] support groups: Feelings After Birth, Breastfeeding Support Group, Baby and Me Group, and Fit 4 Two exercise classes. You have permission to ask for help. It will confirm your feelings, validate your experiences,  share/learn coping strategies, and gain support and encouragement as you heal. You are important! BRAINSTORM Make a list of local resources, including resources for mom and for partner. Identify support groups. Identify people to call late at night - include names and contact info. Talk with partner about perinatal mood and anxiety disorders. Talk with your OB, midwife, and doula about baby blues and about perinatal mood and anxiety disorders. Talk with your pediatrician about perinatal mood and anxiety disorders.   Support & Sanity Savers   What do you really need?  Basics In preparing for a new baby, many expectant parents spend hours shopping for baby clothes, decorating the nursery, and deciding which car seat to buy. Yet most don't think much about what the reality of parenting a newborn will be like, and what they need to make it through that. So, here is the advice of experienced parents. We know you'll read this, and think "they're exaggerating, I don't really need that." Just trust us on these, OK? Plan for all of   this, and if it turns out you don't need it, come back and teach us how you did it!  Must-Haves (Once baby's survival needs are met, make sure you attend to your own survival needs!) Sleep An average newborn sleeps 16-18 hours per day, over 6-7 sleep periods, rarely more than three hours at a time. It is normal and healthy for a newborn to wake throughout the night... but really hard on parents!! Naps. Prioritize sleep above any responsibilities like: cleaning house, visiting friends, running errands, etc.  Sleep whenever baby sleeps. If you can't nap, at least have restful times when baby eats. The more rest you get, the more patient you will be, the more emotionally stable, and better at solving problems.  Food You may not have realized it would be difficult to eat when you have a newborn. Yet, when we talk to countless new parents, they say things like "it may be 2:00 pm  when I realize I haven't had breakfast yet." Or "every time we sit down to dinner, baby needs to eat, and my food gets cold, so I don't bother to eat it." Finger food. Before your baby is born, stock up with one months' worth of food that: 1) you can eat with one hand while holding a baby, 2) doesn't need to be prepped, 3) is good hot or cold, 4) doesn't spoil when left out for a few hours, and 5) you like to eat. Think about: nuts, dried fruit, Clif bars, pretzels, jerky, gogurt, baby carrots, apples, bananas, crackers, cheez-n-crackers, string cheese, hot pockets or frozen burritos to microwave, garden burgers and breakfast pastries to put in the toaster, yogurt drinks, etc. Restaurant Menus. Make lists of your favorite restaurants & menu items. When family/friends want to help, you can give specific information without much thought. They can either bring you the food or send gift cards for just the right meals. Freezer Meals.  Take some time to make a few meals to put in the freezer ahead of time.  Easy to freeze meals can be anything such as soup, lasagna, chicken pie, or spaghetti sauce. Set up a Meal Schedule.  Ask friends and family to sign up to bring you meals during the first few weeks of being home. (It can be passed around at baby showers!) You have no idea how helpful this will be until you are in the throes of parenting.  www.takethemameal.com is a great website to check out. Emotional Support Know who to call when you're stressed out. Parenting a newborn is very challenging work. There are times when it totally overwhelms your normal coping abilities. EVERY NEW PARENT NEEDS TO HAVE A PLAN FOR WHO TO CALL WHEN THEY JUST CAN'T COPE ANY MORE. (And it has to be someone other than the baby's other parent!) Before your baby is born, come up with at least one person you can call for support - write their phone number down and post it on the refrigerator. Anxiety & Sadness. Baby blues are normal after  pregnancy; however, there are more severe types of anxiety & sadness which can occur and should not be ignored.  They are always treatable, but you have to take the first step by reaching out for help. [facility] offers a "Mom Talk" group which meets every Tuesday from 10 am - 11 am.  This group is for new moms who need support and connection after their babies are born.  Call [phone].  Really, Really Helpful (Plan for them!   Make sure these happen often!!) Physical Support with Taking Care of Yourselves Asking friends and family. Before your baby is born, set up a schedule of people who can come and visit and help out (or ask a friend to schedule for you). Any time someone says "let me know what I can do to help," sign them up for a day. When they get there, their job is not to take care of the baby (that's your job and your joy). Their job is to take care of you!  Postpartum doulas. If you don't have anyone you can call on for support, look into postpartum doulas:  professionals at helping parents with caring for baby, caring for themselves, getting breastfeeding started, and helping with household tasks. www.padanc.org is a helpful website for learning about doulas in our area. Peer Support / Parent Groups Why: One of the greatest ideas for new parents is to be around other new parents. Parent groups give you a chance to share and listen to others who are going through the same season of life, get a sense of what is normal infant development by watching several babies learn and grow, share your stories of triumph and struggles with empathetic ears, and forgive your own mistakes when you realize all parents are learning by trial and error. Where to find: There are many places you can meet other new parents throughout our community.  [facility] offers the following classes for new moms and their little ones:  Baby and Me (Birth to Crawling) and Breastfeeding Support Group. Go to  www.[facility].com or call [phone] for more information. Time for your Relationship It's easy to get so caught up in meeting baby's immediate needs that it's hard to find time to connect with your partner, and meet the needs of your relationship. It's also easy to forget what "quality time with your partner" actually looks like. If you take your baby on a date, you'd be amazed how much of your couple time is spent feeding the baby, diapering the baby, admiring the baby, and talking about the baby. Dating: Try to take time for just the two of you. Babysitter tip: Sometimes when moms are breastfeeding a newborn, they find it hard to figure out how to schedule outings around baby's unpredictable feeding schedules. Have the babysitter come for a three hour period. When she comes over, if baby has just eaten, you can leave right away, and come back in two hours. If baby hasn't fed recently, you start the date at home. Once baby gets hungry and gets a good feeding in, you can head out for the rest of your date time. Date Nights at Home: If you can't get out, at least set aside one evening a week to prioritize your relationship: whenever baby dozes off or doesn't have any immediate needs, spend a little time focusing on each other. Potential conflicts: The main relationship conflicts that come up for new parents are: issues related to sexuality, financial stresses, a feeling of an unfair division of household tasks, and conflicts in parenting styles. The more you can work on these issues before baby arrives, the better!  Fun and Frills (Don't forget these. and don't feel guilty for indulging in them!) Everyone has something in life that is a fun little treat that they do just for themselves. It may be: reading the morning paper, or going for a daily jog, or having coffee with a friend once a week, or going to a movie on Friday nights,   or fine chocolates, or bubble baths, or curling up with a good  book. Unless you do fun things for yourself every now and then, it's hard to have the energy for fun with your baby. Whatever your "special" treats are, make sure you find a way to continue to indulge in them after your baby is born. These special moments can recharge you, and allow you to return to baby with a new joy   PERINATAL MOOD DISORDERS: MATERNAL MENTAL HEALTH FROM CONCEPTION THROUGH THE POSTPARTUM PERIOD   _________________________________________Emergency and Crisis Resources If you are an imminent risk to self or others, are experiencing intense personal distress, and/or have noticed significant changes in activities of daily living, call:  911 [facility]: [phone]  [address], [place], [place], [address] Mobile Crisis: 877-626-1772 National Suicide Hotline: 988 Or visit the following crisis centers: Local Emergency Departments [facility]: [address], [place]  [phone]. Hours: 8:30AM-5PM. Insurance Accepted: Medicaid, Medicare, and Uninsured.  [facility]:  [address], [place]  Mon-Friday 8am-3pm, [phone]                                                                                  ___________ Non-Crisis Resources To identify specific providers that are covered by your insurance, contact your insurance company or local agencies:  [facility]: 1-800-256-2452 [facility]: 888-581-9988 [facility]: 1-800-939-5911 Postpartum Support International- Warm-line: 1-800-944-4773                                                      __Outpatient Therapy and Medication Management   Providers:  [facility]: [phone] Hours: 9AM-5PM  Insurance Accepted: AARP, Aetna, BCBS, Cigna, [employer], Humana, Medicare  [facility] [phone] Hours: 8AM-5:30PM  nsurance Accepted: All insurances EXCEPT AARP, Aetna,  [employer], and Humana [facility]: [phone] Hours: 8AM-8PM Insurance Accepted: Aetna, BCBS, Cigna, [employer], Medicaid, Medicare, Uninsured [facility]: [phone] [facility]: [phone] Hours: 8:30AM-7PM Insurance Accepted: Aetna, BCBS, Medicaid, Medicare, Tricare, United Healthcare [facility]:  [phone]   Hours:9AM-5PM Insurance Accepted:  Aetna, BCBS, [facility], Medicaid, United Health Care  [facility]: [phone] Hours: 9AM-5:30PM Insurance Accepted: AARP, Aetna, BCBS, Cigna, and Medicaid, Medicare, United Health Care [facility]:  [phone] Hours: 9am-5pm Insurance Accepted: BCBS; they do not accept Medicaid/Medicare [facility]: [phone] Hours: 9am-9pm Insurance Accepted: All major insurance including Medicaid and Medicare [facility]: [phone] Hours: 9AM- 5PM Insurance Accepted: All insurances EXCEPT Medicaid and Medicare. [facility]: [phone]   ____________                                                                       Parenting Support Groups [facility]: [phone] [facility]:  [phone] Family Support Network: (support for children in the NICU and/or with special needs), [phone]   ___________                                                                 Mental Health Support Groups Mental Health Association: [phone]    _____________                                                                                  Online Resources Postpartum Support International: http://www.postpartum.net/  800-944-4PPD 2Moms Supporting Moms:  www.momssupportingmoms.net    

## 2021-09-22 NOTE — Progress Notes (Signed)
   PRENATAL VISIT NOTE  Subjective:  Holly Vazquez is a 40 y.o. G6P5006 at [redacted]w[redacted]d being seen today for ongoing prenatal care.  She is currently monitored for the following issues for this high-risk pregnancy and has Anemia; Allergic urticaria; Allergic rhinitis; Pelvic pain; Cervical polyp; PMDD (premenstrual dysphoric disorder); Anxiety; Supervision of high risk pregnancy, antepartum; AMA (advanced maternal age) multigravida 36+; Alpha thalassemia silent carrier; [redacted] weeks gestation of pregnancy; Tetrahydrocannabinol (THC) use disorder, mild, abuse; Adjustment disorder with mixed anxiety and depressed mood; Cannabis use disorder; [redacted] weeks gestation of pregnancy; and Food insecurity on their problem list.  Patient doing well with no acute concerns today. She reports no complaints.  Contractions: Not present. Vag. Bleeding: None.  Movement: Present. Denies leaking of fluid.   The following portions of the patient's history were reviewed and updated as appropriate: allergies, current medications, past family history, past medical history, past social history, past surgical history and problem list. Problem list updated.  Objective:   Vitals:   09/21/21 1336  BP: 124/76  Pulse: 80  Weight: 141 lb 12.8 oz (64.3 kg)    Fetal Status: Fetal Heart Rate (bpm): 158   Movement: Present     General:  Alert, oriented and cooperative. Patient is in no acute distress.  Skin: Skin is warm and dry. No rash noted.   Cardiovascular: Normal heart rate noted  Respiratory: Normal respiratory effort, no problems with respiration noted  Abdomen: Soft, gravid, appropriate for gestational age.  Pain/Pressure: Absent     Pelvic: Cervical exam deferred        Extremities: Normal range of motion.  Edema: None  Mental Status:  Normal mood and affect. Normal behavior. Normal judgment and thought content.   Assessment and Plan:  Pregnancy: G6P5006 at [redacted]w[redacted]d  1. [redacted] weeks gestation of pregnancy   2. Multigravida  of advanced maternal age in second trimester   3. Supervision of high risk pregnancy, antepartum Continue routine care  4. Alpha thalassemia silent carrier   5. Tetrahydrocannabinol (THC) use disorder, mild, abuse Pt notes she has completely stopped THC use.  Pt reassured and complemented on the achievement  6. PMDD (premenstrual dysphoric disorder) N/a  7. Anxiety Pt very anxious about her separation/divorce proceedings, she has access to behavorial health  8. Food insecurity  - AMBULATORY REFERRAL TO [facility]  Preterm labor symptoms and general obstetric precautions including but not limited to vaginal bleeding, contractions, leaking of fluid and fetal movement were reviewed in detail with the patient.  Please refer to After Visit Summary for other counseling recommendations.   Return in about 4 weeks (around 10/19/2021) for [facility], in person.   Lynnda Shields, MD Faculty Attending Center for [facility]

## 2021-09-26 ENCOUNTER — Telehealth (HOSPITAL_COMMUNITY): Payer: Self-pay | Admitting: Professional

## 2021-09-26 ENCOUNTER — Ambulatory Visit (HOSPITAL_COMMUNITY): Payer: Medicaid Other

## 2021-09-27 ENCOUNTER — Other Ambulatory Visit: Payer: Medicaid Other

## 2021-09-29 ENCOUNTER — Telehealth (HOSPITAL_COMMUNITY): Payer: Self-pay | Admitting: Professional

## 2021-09-29 ENCOUNTER — Ambulatory Visit (HOSPITAL_COMMUNITY): Payer: Medicaid Other

## 2021-09-30 ENCOUNTER — Ambulatory Visit: Payer: Medicaid Other | Attending: Obstetrics

## 2021-09-30 ENCOUNTER — Ambulatory Visit: Payer: Medicaid Other | Admitting: *Deleted

## 2021-09-30 ENCOUNTER — Other Ambulatory Visit: Payer: Self-pay

## 2021-09-30 ENCOUNTER — Other Ambulatory Visit: Payer: Self-pay | Admitting: *Deleted

## 2021-09-30 VITALS — BP 122/72 | HR 69

## 2021-09-30 DIAGNOSIS — D563 Thalassemia minor: Secondary | ICD-10-CM | POA: Diagnosis present

## 2021-09-30 DIAGNOSIS — F1210 Cannabis abuse, uncomplicated: Secondary | ICD-10-CM | POA: Diagnosis present

## 2021-09-30 DIAGNOSIS — O3412 Maternal care for benign tumor of corpus uteri, second trimester: Secondary | ICD-10-CM

## 2021-09-30 DIAGNOSIS — O0990 Supervision of high risk pregnancy, unspecified, unspecified trimester: Secondary | ICD-10-CM | POA: Diagnosis present

## 2021-09-30 DIAGNOSIS — O09522 Supervision of elderly multigravida, second trimester: Secondary | ICD-10-CM | POA: Diagnosis present

## 2021-09-30 DIAGNOSIS — D259 Leiomyoma of uterus, unspecified: Secondary | ICD-10-CM

## 2021-09-30 DIAGNOSIS — O99322 Drug use complicating pregnancy, second trimester: Secondary | ICD-10-CM

## 2021-09-30 DIAGNOSIS — Z3A23 23 weeks gestation of pregnancy: Secondary | ICD-10-CM

## 2021-10-03 ENCOUNTER — Other Ambulatory Visit: Payer: Self-pay

## 2021-10-03 ENCOUNTER — Other Ambulatory Visit (HOSPITAL_COMMUNITY)
Admission: RE | Admit: 2021-10-03 | Discharge: 2021-10-03 | Disposition: A | Discharge status: Home / Self Care | Payer: Medicaid Other | Source: Ambulatory Visit | Attending: Family Medicine | Admitting: Family Medicine

## 2021-10-03 ENCOUNTER — Ambulatory Visit (INDEPENDENT_AMBULATORY_CARE_PROVIDER_SITE_OTHER): Payer: Medicaid Other | Admitting: *Deleted

## 2021-10-03 VITALS — BP 126/69 | HR 88 | Ht 64.5 in | Wt 141.7 lb

## 2021-10-03 DIAGNOSIS — N898 Other specified noninflammatory disorders of vagina: Secondary | ICD-10-CM

## 2021-10-03 DIAGNOSIS — O09529 Supervision of elderly multigravida, unspecified trimester: Secondary | ICD-10-CM

## 2021-10-03 DIAGNOSIS — O0990 Supervision of high risk pregnancy, unspecified, unspecified trimester: Secondary | ICD-10-CM

## 2021-10-03 NOTE — Progress Notes (Signed)
Here for nurse visit. C/o yellowish vaginal discharge with bad odor . Would like to do self swab. Informed will treat per results.- would like pills instead of cream if +.  Aldwin Micalizzi,RN

## 2021-10-03 NOTE — Addendum Note (Signed)
Addended by: Samuel Germany on: 10/03/2021 12:24 PM   Modules accepted: Orders

## 2021-10-04 ENCOUNTER — Other Ambulatory Visit: Payer: Self-pay | Admitting: Obstetrics and Gynecology

## 2021-10-04 LAB — CERVICOVAGINAL ANCILLARY ONLY
Bacterial Vaginitis (gardnerella): POSITIVE — AB
Candida Glabrata: NEGATIVE
Candida Vaginitis: POSITIVE — AB
Chlamydia: NEGATIVE
Comment: NEGATIVE
Comment: NEGATIVE
Comment: NEGATIVE
Comment: NEGATIVE
Comment: NEGATIVE
Comment: NORMAL
Neisseria Gonorrhea: NEGATIVE
Trichomonas: NEGATIVE

## 2021-10-11 ENCOUNTER — Other Ambulatory Visit: Payer: Self-pay | Admitting: Lactation Services

## 2021-10-11 ENCOUNTER — Encounter: Payer: Self-pay | Admitting: Obstetrics and Gynecology

## 2021-10-11 ENCOUNTER — Telehealth: Payer: Self-pay | Admitting: Family Medicine

## 2021-10-11 MED ORDER — CLINDAMYCIN HCL 300 MG PO CAPS: 300.0000 mg | ORAL_CAPSULE | Freq: Two times a day (BID) | ORAL | 0 refills | Status: DC

## 2021-10-11 MED ORDER — TERCONAZOLE 0.4 % VA CREA: 1.0000 | TOPICAL_CREAM | Freq: Every day | VAGINAL | 0 refills | Status: DC

## 2021-10-11 NOTE — Telephone Encounter (Signed)
My Chart message answered for patient. Medication has been prescribed for patient.   Called and spoke with patient. She is having tightening to the abdomen ocassionally,  mostly after using the restroom. She denies frequency, urgency or burning of urination.   Patient reports she is concerned with cost of Medications. Reviewed that she can use OTC Monistat for 7 days also, patient voiced understanding.   Patient requested Medications be sent to [facility] [place] and asked for prescriptions to be transferred to them.   [facility] requested medication be resent, so was resent. He will call and cancel [facility] prescription.

## 2021-10-11 NOTE — Telephone Encounter (Signed)
Patient called in regards to needing medication refilled, there has been some miscommunication within the way the refill needs to be done which has delayed the refill for the patient. She is in need of this medication asap.

## 2021-10-11 NOTE — Progress Notes (Signed)
Patient was assessed and managed by nursing staff during this encounter. I have reviewed the chart and agree with the documentation and plan. I have also made any necessary editorial changes.  Aletha Halim, MD 10/11/2021 11:54 AM

## 2021-10-11 NOTE — Progress Notes (Signed)
Clindamycin sent to Pharmacy for BV as patient allergic to Flagyl and Terazol for yeast infection.

## 2021-10-16 NOTE — L&D Delivery Note (Signed)
Delivery Note ?At 9:05 PM a viable female was delivered via Vaginal, Spontaneous (Presentation: Left Occiput Anterior).  APGAR: 8, 9; weight pending .   ?Placenta status: Spontaneous, Intact.  Cord: 3 vessels with the following complications: None.  Cord pH: NA ?After a short second stage, infant delivered over an intact perineum. Placenta delivered with controlled gentle traction; no complications with delivery. TXA was hung at delivery; cytotec was pulled but not given as EBL was 100 ml.  ? ?Anesthesia: Epidural ?Episiotomy: None ?Lacerations: None ?Suture Repair: NOne ?Est. Blood Loss (mL): 100 ? ?Mom to postpartum.  Baby to Couplet care / Skin to Skin. ? ?[place] ?01/21/2022, 9:34 PM ? ? ? ?

## 2021-10-18 ENCOUNTER — Other Ambulatory Visit: Payer: Medicaid Other

## 2021-10-19 ENCOUNTER — Other Ambulatory Visit: Payer: Self-pay

## 2021-10-19 ENCOUNTER — Ambulatory Visit (INDEPENDENT_AMBULATORY_CARE_PROVIDER_SITE_OTHER): Payer: Medicaid Other | Admitting: Obstetrics and Gynecology

## 2021-10-19 VITALS — BP 134/73 | HR 92 | Wt 144.7 lb

## 2021-10-19 DIAGNOSIS — F4323 Adjustment disorder with mixed anxiety and depressed mood: Secondary | ICD-10-CM

## 2021-10-19 DIAGNOSIS — O09522 Supervision of elderly multigravida, second trimester: Secondary | ICD-10-CM

## 2021-10-19 DIAGNOSIS — Z3A25 25 weeks gestation of pregnancy: Secondary | ICD-10-CM | POA: Insufficient documentation

## 2021-10-19 DIAGNOSIS — D563 Thalassemia minor: Secondary | ICD-10-CM

## 2021-10-19 DIAGNOSIS — O0990 Supervision of high risk pregnancy, unspecified, unspecified trimester: Secondary | ICD-10-CM

## 2021-10-19 NOTE — Progress Notes (Signed)
° °  PRENATAL VISIT NOTE  Subjective:  Liley Rake is a 41 y.o. G6P5006 at [redacted]w[redacted]d being seen today for ongoing prenatal care.  She is currently monitored for the following issues for this high-risk pregnancy and has Anemia; Allergic urticaria; Allergic rhinitis; Pelvic pain; Cervical polyp; PMDD (premenstrual dysphoric disorder); Anxiety; Supervision of high risk pregnancy, antepartum; AMA (advanced maternal age) multigravida 40+; Alpha thalassemia silent carrier; [redacted] weeks gestation of pregnancy; Tetrahydrocannabinol (THC) use disorder, mild, abuse; Adjustment disorder with mixed anxiety and depressed mood; Cannabis use disorder; [redacted] weeks gestation of pregnancy; Food insecurity; and [redacted] weeks gestation of pregnancy on their problem list.  Patient doing well with no acute concerns today. She reports  continued life stress .  Contractions: Irritability. Vag. Bleeding: None.  Movement: Present. Denies leaking of fluid.   The following portions of the patient's history were reviewed and updated as appropriate: allergies, current medications, past family history, past medical history, past social history, past surgical history and problem list. Problem list updated.  Objective:   Vitals:   10/19/21 1457  BP: 134/73  Pulse: 92  Weight: 144 lb 11.2 oz (65.6 kg)    Fetal Status: Fetal Heart Rate (bpm): 147 Fundal Height: 26 cm Movement: Present     General:  Alert, oriented and cooperative. Patient is in no acute distress.  Skin: Skin is warm and dry. No rash noted.   Cardiovascular: Normal heart rate noted  Respiratory: Normal respiratory effort, no problems with respiration noted  Abdomen: Soft, gravid, appropriate for gestational age.  Pain/Pressure: Absent     Pelvic: Cervical exam deferred        Extremities: Normal range of motion.  Edema: None  Mental Status:  Normal mood and affect. Normal behavior. Normal judgment and thought content.   Assessment and Plan:  Pregnancy: G6P5006 at  [redacted]w[redacted]d  1. [redacted] weeks gestation of pregnancy   2. Supervision of high risk pregnancy, antepartum Continue routine care 2 hour GTT next  visit  3. Multigravida of advanced maternal age in second trimester  4. Alpha thalassemia silent carrier   5. Adjustment disorder with mixed anxiety and depressed mood Pt to see behavorial health specialist (MD)on 10/20/21, she wants to increase her zoloft dose.  Discussed risks and benefits with fetal effects especially in third trimester, but she may need the assistance due to the social situation  Preterm labor symptoms and general obstetric precautions including but not limited to vaginal bleeding, contractions, leaking of fluid and fetal movement were reviewed in detail with the patient.  Please refer to After Visit Summary for other counseling recommendations.   Return in about 2 weeks (around 11/02/2021) for [facility], in person, 3rd trim labs, 2 hr GTT.   Lynnda Shields, MD Faculty Attending Center for [facility]

## 2021-10-20 ENCOUNTER — Ambulatory Visit (INDEPENDENT_AMBULATORY_CARE_PROVIDER_SITE_OTHER): Payer: Medicaid Other | Admitting: Student in an Organized Health Care Education/Training Program

## 2021-10-20 ENCOUNTER — Encounter (HOSPITAL_COMMUNITY): Payer: Self-pay | Admitting: Student in an Organized Health Care Education/Training Program

## 2021-10-20 VITALS — BP 131/71 | HR 76 | Ht 64.5 in | Wt 140.0 lb

## 2021-10-20 DIAGNOSIS — F419 Anxiety disorder, unspecified: Secondary | ICD-10-CM

## 2021-10-20 DIAGNOSIS — F4310 Post-traumatic stress disorder, unspecified: Secondary | ICD-10-CM | POA: Insufficient documentation

## 2021-10-20 DIAGNOSIS — F331 Major depressive disorder, recurrent, moderate: Secondary | ICD-10-CM | POA: Diagnosis not present

## 2021-10-20 MED ORDER — SERTRALINE HCL 100 MG PO TABS: 100.0000 mg | ORAL_TABLET | Freq: Every day | ORAL | 0 refills | Status: DC

## 2021-10-20 MED ORDER — HYDROXYZINE HCL 25 MG PO TABS: 25.0000 mg | ORAL_TABLET | Freq: Four times a day (QID) | ORAL | 0 refills | Status: DC
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
## 2021-10-25 ENCOUNTER — Ambulatory Visit (INDEPENDENT_AMBULATORY_CARE_PROVIDER_SITE_OTHER): Payer: Medicaid Other | Admitting: Licensed Clinical Social Worker

## 2021-10-25 ENCOUNTER — Other Ambulatory Visit: Payer: Self-pay

## 2021-10-25 DIAGNOSIS — F332 Major depressive disorder, recurrent severe without psychotic features: Secondary | ICD-10-CM | POA: Diagnosis not present

## 2021-10-27 ENCOUNTER — Other Ambulatory Visit: Payer: Medicaid Other

## 2021-10-27 NOTE — Progress Notes (Deleted)
Medical Nutrition Therapy  Appointment Start time:  ***  Appointment End time:  ***  Primary concerns today: ***  Referral diagnosis: *** Preferred learning style: *** (auditory, visual, hands on, no preference indicated) Learning readiness: *** (not ready, contemplating, ready, change in progress)   NUTRITION ASSESSMENT   Anthropometrics  Wt Readings from Last 3 Encounters:  10/19/21 144 lb 11.2 oz (65.6 kg)  10/03/21 141 lb 11.2 oz (64.3 kg)  09/21/21 141 lb 12.8 oz (64.3 kg)      Clinical Medical Hx: *** Medications: *** Labs: *** Notable Signs/Symptoms: ***  Lifestyle & Dietary Hx ***  Estimated daily fluid intake: *** oz Supplements: *** Sleep: *** Stress / self-care: *** Current average weekly physical activity: ***  24-Hr Dietary Recall First Meal: *** Snack: *** Second Meal: *** Snack: *** Third Meal: *** Snack: *** Beverages: ***  Estimated Energy Needs Calories: *** Carbohydrate: ***g Protein: ***g Fat: ***g   NUTRITION DIAGNOSIS  {CHL AMB NUTRITIONAL DIAGNOSIS:[id number]}   NUTRITION INTERVENTION  Nutrition education (E-1) on the following topics:  ***  Handouts Provided Include  ***  Learning Style & Readiness for Change Teaching method utilized: Visual & Auditory  Demonstrated degree of understanding via: Teach Back  Barriers to learning/adherence to lifestyle change: ***  Goals Established by Pt ***   MONITORING & EVALUATION Dietary intake, weekly physical activity, and *** in ***.  Next Steps  Patient is to ***.

## 2021-10-28 ENCOUNTER — Other Ambulatory Visit: Payer: Self-pay | Admitting: Obstetrics and Gynecology

## 2021-10-28 DIAGNOSIS — O0990 Supervision of high risk pregnancy, unspecified, unspecified trimester: Secondary | ICD-10-CM

## 2021-10-28 MED ORDER — ONDANSETRON 4 MG PO TBDP: 4.0000 mg | ORAL_TABLET | Freq: Four times a day (QID) | ORAL | 0 refills | Status: DC | PRN
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
## 2021-10-31 ENCOUNTER — Other Ambulatory Visit: Payer: Self-pay | Admitting: *Deleted

## 2021-10-31 DIAGNOSIS — O0990 Supervision of high risk pregnancy, unspecified, unspecified trimester: Secondary | ICD-10-CM

## 2021-11-02 ENCOUNTER — Encounter: Payer: Self-pay | Admitting: Obstetrics and Gynecology

## 2021-11-02 ENCOUNTER — Other Ambulatory Visit: Payer: Medicaid Other

## 2021-11-02 ENCOUNTER — Ambulatory Visit (INDEPENDENT_AMBULATORY_CARE_PROVIDER_SITE_OTHER): Payer: Medicaid Other | Admitting: Obstetrics and Gynecology

## 2021-11-02 ENCOUNTER — Other Ambulatory Visit: Payer: Self-pay

## 2021-11-02 VITALS — BP 115/74 | HR 80 | Wt 143.6 lb

## 2021-11-02 DIAGNOSIS — D259 Leiomyoma of uterus, unspecified: Secondary | ICD-10-CM

## 2021-11-02 DIAGNOSIS — Z641 Problems related to multiparity: Secondary | ICD-10-CM | POA: Insufficient documentation

## 2021-11-02 DIAGNOSIS — O3410 Maternal care for benign tumor of corpus uteri, unspecified trimester: Secondary | ICD-10-CM

## 2021-11-02 DIAGNOSIS — F331 Major depressive disorder, recurrent, moderate: Secondary | ICD-10-CM

## 2021-11-02 DIAGNOSIS — O0990 Supervision of high risk pregnancy, unspecified, unspecified trimester: Secondary | ICD-10-CM | POA: Diagnosis not present

## 2021-11-02 DIAGNOSIS — F419 Anxiety disorder, unspecified: Secondary | ICD-10-CM

## 2021-11-02 DIAGNOSIS — Z23 Encounter for immunization: Secondary | ICD-10-CM | POA: Diagnosis not present

## 2021-11-02 DIAGNOSIS — F3281 Premenstrual dysphoric disorder: Secondary | ICD-10-CM

## 2021-11-02 DIAGNOSIS — F4310 Post-traumatic stress disorder, unspecified: Secondary | ICD-10-CM

## 2021-11-02 DIAGNOSIS — O09522 Supervision of elderly multigravida, second trimester: Secondary | ICD-10-CM

## 2021-11-02 DIAGNOSIS — Z3A27 27 weeks gestation of pregnancy: Secondary | ICD-10-CM

## 2021-11-02 DIAGNOSIS — O0973 Supervision of high risk pregnancy due to social problems, third trimester: Secondary | ICD-10-CM | POA: Insufficient documentation

## 2021-11-02 DIAGNOSIS — F4323 Adjustment disorder with mixed anxiety and depressed mood: Secondary | ICD-10-CM

## 2021-11-02 NOTE — Progress Notes (Signed)
Patient has declined food market today. She is currently staying at a hotel until she has housing lined up. Patient states it would be more items she would have to move so she would like to go to our food market next visit. Patient has elevated PHQ9/GAD7, states she is already working with [facility] about this.   Holly Fusi, RN 11/02/21

## 2021-11-02 NOTE — Progress Notes (Signed)
PRENATAL VISIT NOTE  Subjective:  Holly Vazquez is a 41 y.o. G6P5006 at [redacted]w[redacted]d being seen today for ongoing prenatal care.  She is currently monitored for the following issues for this high-risk pregnancy and has Anemia; PMDD (premenstrual dysphoric disorder); Anxiety; Supervision of high risk pregnancy, antepartum; AMA (advanced maternal age) multigravida 61+; Alpha thalassemia silent carrier; Tetrahydrocannabinol (THC) use disorder, mild, abuse; Adjustment disorder with mixed anxiety and depressed mood; Cannabis use disorder; Food insecurity; PTSD (post-traumatic stress disorder); MDD (major depressive disorder), recurrent episode, moderate ([place]); [place] multipara; and Uterine fibroid complicating antenatal care, baby not yet delivered on their problem list.  Patient reports no complaints.  Contractions: Irritability. Vag. Bleeding: None.  Movement: Present. Denies leaking of fluid.   The following portions of the patient's history were reviewed and updated as appropriate: allergies, current medications, past family history, past medical history, past social history, past surgical history and problem list.   Objective:   Vitals:   11/02/21 0833  BP: 115/74  Pulse: 80  Weight: 143 lb 9.6 oz (65.1 kg)    Fetal Status: Fetal Heart Rate (bpm): 145   Movement: Present     General:  Alert, oriented and cooperative. Patient is in no acute distress.  Skin: Skin is warm and dry. No rash noted.   Cardiovascular: Normal heart rate noted  Respiratory: Normal respiratory effort, no problems with respiration noted  Abdomen: Soft, gravid, appropriate for gestational age.  Pain/Pressure: Absent     Pelvic: Cervical exam deferred        Extremities: Normal range of motion.  Edema: None  Mental Status: Normal mood and affect. Normal behavior. Normal judgment and thought content.   Assessment and Plan:  Pregnancy: G6P5006 at [redacted]w[redacted]d 1. [redacted] weeks gestation of pregnancy 28wk labs today. D/w her re:  birth control options. Pt intersted in [place] so paperwork signed today but f/u at subsequent visits re: BC. Pt has PMDD so [place] may also be a good option so for her  12/16: 51%, 569g, ac 77%>follow up 2/17 rpt surveillance growth u/s for ama and fibroids.  - Tdap vaccine greater than or equal to 7yo IM  2. Grand multipara Recommend [place] with delivery  3. Uterine fibroid complicating antenatal care, baby not yet delivered I told her that it shouldn't be an issue (2cm)  4. Supervision of high risk pregnancy, antepartum  5. Multigravida of advanced maternal age in second trimester  6. PTSD (post-traumatic stress disorder) Followed by pysch; currently doing well on zoloft and vistaril SW consult PP  7. Adjustment disorder with mixed anxiety and depressed mood  8. MDD (major depressive disorder), recurrent episode, moderate ([place])  9. PMDD (premenstrual dysphoric disorder)  10. Supervision of high risk pregnancy due to social problems Currently living in a hotel but has application in for housing. Will have pregnancy navigators touch base with her again today to make sure there's nothing else they can do for her  Preterm labor symptoms and general obstetric precautions including but not limited to vaginal bleeding, contractions, leaking of fluid and fetal movement were reviewed in detail with the patient. Please refer to After Visit Summary for other counseling recommendations.   Return in about 2 weeks (around 11/16/2021) for low risk ob, md or app, in person.  Future Appointments  Date Time Provider [place]  11/02/2021  9:30 AM [facility]  11/03/2021  3:30 PM Kai Levins Redgie Grayer, MD [facility] None  11/14/2021  8:00 AM Hermine Messick, LCSW [facility]  None  11/28/2021 10:00 AM Hermine Messick, LCSW [facility] None  12/02/2021  9:15 AM [facility] NURSE [facility]  12/02/2021  9:30 AM [facility] US3 [facility]  01/09/2022  8:00 AM Hatch, Jamesetta Geralds, LCSW  [facility] None    Aletha Halim, MD

## 2021-11-03 ENCOUNTER — Encounter (HOSPITAL_COMMUNITY): Payer: Self-pay

## 2021-11-03 ENCOUNTER — Encounter (HOSPITAL_COMMUNITY): Payer: Medicaid Other | Admitting: Student in an Organized Health Care Education/Training Program

## 2021-11-03 LAB — CBC
Hematocrit: 33.9 % — ABNORMAL LOW (ref 34.0–46.6)
Hemoglobin: 10.6 g/dL — ABNORMAL LOW (ref 11.1–15.9)
MCH: 22.9 pg — ABNORMAL LOW (ref 26.6–33.0)
MCHC: 31.3 g/dL — ABNORMAL LOW (ref 31.5–35.7)
MCV: 73 fL — ABNORMAL LOW (ref 79–97)
Platelets: 281 10*3/uL (ref 150–450)
RBC: 4.63 x10E6/uL (ref 3.77–5.28)
RDW: 14.4 % (ref 11.7–15.4)
WBC: 8.9 10*3/uL (ref 3.4–10.8)

## 2021-11-03 LAB — GLUCOSE TOLERANCE, 2 HOURS W/ 1HR
Glucose, 1 hour: 108 mg/dL (ref 70–179)
Glucose, 2 hour: 91 mg/dL (ref 70–152)
Glucose, Fasting: 87 mg/dL (ref 70–91)

## 2021-11-03 LAB — HIV ANTIBODY (ROUTINE TESTING W REFLEX): HIV Screen 4th Generation wRfx: NONREACTIVE

## 2021-11-03 LAB — RPR: RPR Ser Ql: NONREACTIVE

## 2021-11-08 ENCOUNTER — Other Ambulatory Visit: Payer: Self-pay | Admitting: Obstetrics and Gynecology

## 2021-11-08 DIAGNOSIS — O0990 Supervision of high risk pregnancy, unspecified, unspecified trimester: Secondary | ICD-10-CM

## 2021-11-09 ENCOUNTER — Other Ambulatory Visit: Payer: Self-pay

## 2021-11-09 DIAGNOSIS — O219 Vomiting of pregnancy, unspecified: Secondary | ICD-10-CM

## 2021-11-09 MED ORDER — PROMETHAZINE HCL 25 MG PO TABS: 25.0000 mg | ORAL_TABLET | Freq: Four times a day (QID) | ORAL | 0 refills | Status: DC | PRN

## 2021-11-09 NOTE — Progress Notes (Signed)
Pt sent message requesting refill of Zofran. Reports using 20 tab in 5 days. Would like to try different medication. Phenergan sent per protocol. Pt to follow up for Zofran refill if desired.

## 2021-11-10 ENCOUNTER — Other Ambulatory Visit: Payer: Self-pay

## 2021-11-10 ENCOUNTER — Ambulatory Visit (INDEPENDENT_AMBULATORY_CARE_PROVIDER_SITE_OTHER): Payer: Medicaid Other | Admitting: Student in an Organized Health Care Education/Training Program

## 2021-11-10 ENCOUNTER — Encounter (HOSPITAL_COMMUNITY): Payer: Self-pay | Admitting: Student in an Organized Health Care Education/Training Program

## 2021-11-10 VITALS — BP 129/73 | HR 76 | Ht 64.5 in | Wt 145.0 lb

## 2021-11-10 DIAGNOSIS — F331 Major depressive disorder, recurrent, moderate: Secondary | ICD-10-CM | POA: Diagnosis not present

## 2021-11-10 DIAGNOSIS — F4310 Post-traumatic stress disorder, unspecified: Secondary | ICD-10-CM | POA: Diagnosis not present

## 2021-11-10 DIAGNOSIS — F419 Anxiety disorder, unspecified: Secondary | ICD-10-CM

## 2021-11-10 MED ORDER — SERTRALINE HCL 100 MG PO TABS: 100.0000 mg | ORAL_TABLET | Freq: Every day | ORAL | 0 refills | Status: DC

## 2021-11-10 MED ORDER — HYDROXYZINE HCL 25 MG PO TABS: 25.0000 mg | ORAL_TABLET | Freq: Four times a day (QID) | ORAL | 0 refills | Status: DC

## 2021-11-10 MED ORDER — OLANZAPINE 2.5 MG PO TABS: 2.5000 mg | ORAL_TABLET | Freq: Every day | ORAL | 0 refills | Status: DC
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
## 2021-11-14 ENCOUNTER — Other Ambulatory Visit: Payer: Self-pay

## 2021-11-14 ENCOUNTER — Ambulatory Visit (INDEPENDENT_AMBULATORY_CARE_PROVIDER_SITE_OTHER): Payer: Medicaid Other | Admitting: Licensed Clinical Social Worker

## 2021-11-14 DIAGNOSIS — F331 Major depressive disorder, recurrent, moderate: Secondary | ICD-10-CM | POA: Diagnosis not present

## 2021-11-14 NOTE — Progress Notes (Signed)
° °  THERAPIST PROGRESS NOTE  Session Time: 32 min  Participation Level: Active  Behavioral Response: CasualAlertDepressed  Type of Therapy: Individual Therapy  Treatment Goals addressed: Communication: dep/coping  Interventions: CBT, Solution Focused, Supportive, and Reframing  Summary: Holly Vazquez is a 41 y.o. female who presents with hx of MDD.  Today patient returns for in person session.  LCSW assessed for overall status.  Patient reports she is in a much better place than she was last session.  She states she and her children all moved from the hotel into a 2 bedroom trailer at [facility].  She provides new address which was also given to Systems analyst.  Patient provides details on how she continued to get help with fundraising, got help from the father of her children and from the family [place].  Patient reports they just moved last Friday.  She states she got a call from a stranger on Saturday regarding help with donated furniture and this person also provided 5 beds.  Patient reports they are still crowded but it is far better than the hotel room they were trying to share.  Patient continues to struggle with poor sleep and irritability.  She states she is taking her medication as prescribed but has not yet picked up the new medication prescribed last Thursday because she did not have the money.  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  She is hopeful this will help her with sleep in addition to her overall circumstances being improved.  Patient reports she is doing well with her pregnancy with the exception of fatigue and shortness of breath in her third trimester.  Today LCSW addressed self talk with patient and provided self talk literature for her to take with her.  Again reviewed deep breathing.  Patient reports with her circumstances being more calm she feels like she can focus on  these items between now and next session. LCSW reviewed poc including scheduling prior to close of session. Pt states appreciation for care.   Suicidal/Homicidal: Nowithout intent/plan  Therapist Response: Pt receptive to care.  Plan: Return again in ~4 weeks.  Diagnosis: Axis I:  MDD, moderate  Hermine Messick, LCSW 11/14/2021

## 2021-11-17 ENCOUNTER — Other Ambulatory Visit: Payer: Self-pay

## 2021-11-17 ENCOUNTER — Ambulatory Visit (INDEPENDENT_AMBULATORY_CARE_PROVIDER_SITE_OTHER): Payer: Medicaid Other

## 2021-11-17 VITALS — BP 114/62 | HR 76 | Wt 152.0 lb

## 2021-11-17 DIAGNOSIS — Z3A29 29 weeks gestation of pregnancy: Secondary | ICD-10-CM

## 2021-11-17 DIAGNOSIS — O99013 Anemia complicating pregnancy, third trimester: Secondary | ICD-10-CM

## 2021-11-17 DIAGNOSIS — F4310 Post-traumatic stress disorder, unspecified: Secondary | ICD-10-CM

## 2021-11-17 DIAGNOSIS — Z5941 Food insecurity: Secondary | ICD-10-CM

## 2021-11-17 DIAGNOSIS — O0973 Supervision of high risk pregnancy due to social problems, third trimester: Secondary | ICD-10-CM

## 2021-11-17 DIAGNOSIS — Z641 Problems related to multiparity: Secondary | ICD-10-CM

## 2021-11-17 NOTE — Progress Notes (Signed)
° °  PRENATAL VISIT NOTE  Subjective:  Holly Vazquez is a 41 y.o. G6P5006 at [redacted]w[redacted]d being seen today for ongoing prenatal care.  She is currently monitored for the following issues for this high-risk pregnancy and has Anemia; PMDD (premenstrual dysphoric disorder); Anxiety; Supervision of high risk pregnancy, antepartum; AMA (advanced maternal age) multigravida 45+; Alpha thalassemia silent carrier; Tetrahydrocannabinol (THC) use disorder, mild, abuse; Adjustment disorder with mixed anxiety and depressed mood; Cannabis use disorder; Food insecurity; PTSD (post-traumatic stress disorder); MDD (major depressive disorder), recurrent episode, moderate ([place]); [place] multipara; Uterine fibroid complicating antenatal care, baby not yet delivered; and Supervision of high risk pregnancy due to social problems, third trimester on their problem list.  Patient reports no complaints.  Contractions: Irritability. Vag. Bleeding: None.  Movement: Present. Denies leaking of fluid.   The following portions of the patient's history were reviewed and updated as appropriate: allergies, current medications, past family history, past medical history, past social history, past surgical history and problem list.   Objective:   Vitals:   11/17/21 1032  BP: 114/62  Pulse: 76  Weight: 152 lb (68.9 kg)    Fetal Status: Fetal Heart Rate (bpm): 132 Fundal Height: 30 cm Movement: Present     General:  Alert, oriented and cooperative. Patient is in no acute distress.  Skin: Skin is warm and dry. No rash noted.   Cardiovascular: Normal heart rate noted  Respiratory: Normal respiratory effort, no problems with respiration noted  Abdomen: Soft, gravid, appropriate for gestational age.  Pain/Pressure: Absent     Pelvic: Cervical exam deferred        Extremities: Normal range of motion.  Edema: None  Mental Status: Normal mood and affect. Normal behavior. Normal judgment and thought content.   Assessment and Plan:   Pregnancy: G6P5006 at [redacted]w[redacted]d 1. Food insecurity  - AMBULATORY REFERRAL TO [place] [facility]  2. Supervision of high risk pregnancy due to social problems, third trimester - Routine OB. Doing well. No concerns. - Moved into house with children last week. No longer living in hotel. Finally feels safe and stable. - Anticipatory guidance for upcoming appointments provided  3. [redacted] weeks gestation of pregnancy   4. Anemia affecting pregnancy in third trimester - Reports feeling having more energy now that she is on iron supplement  5. PTSD (post-traumatic stress disorder) - Has psychiatrist. Recently started on Olanzapine and doing well. Feels less irritable and sad and enjoying life more. Finally feels happy.   6. Grand multipara   Preterm labor symptoms and general obstetric precautions including but not limited to vaginal bleeding, contractions, leaking of fluid and fetal movement were reviewed in detail with the patient. Please refer to After Visit Summary for other counseling recommendations.   Return in about 2 weeks (around 12/01/2021).  Future Appointments  Date Time Provider [place]  11/24/2021  3:30 PM Briant Cedar, MD [facility] None  11/28/2021 10:00 AM Hermine Messick, LCSW [facility] None  12/02/2021  9:15 AM [facility] NURSE [facility]  12/02/2021  9:30 AM [facility] US3 [facility]  12/05/2021  3:35 PM Chancy Milroy, MD [facility]  01/09/2022  8:00 AM Hermine Messick, LCSW [facility] None  02/01/2022 10:00 AM Hermine Messick, LCSW [facility] None    Renee Harder, CNM

## 2021-11-24 ENCOUNTER — Ambulatory Visit (INDEPENDENT_AMBULATORY_CARE_PROVIDER_SITE_OTHER): Payer: Medicaid Other | Admitting: Student in an Organized Health Care Education/Training Program

## 2021-11-24 ENCOUNTER — Other Ambulatory Visit: Payer: Self-pay

## 2021-11-24 ENCOUNTER — Encounter (HOSPITAL_COMMUNITY): Payer: Self-pay | Admitting: Student in an Organized Health Care Education/Training Program

## 2021-11-24 VITALS — BP 131/64 | HR 84 | Ht 64.5 in | Wt 154.0 lb

## 2021-11-24 DIAGNOSIS — F4310 Post-traumatic stress disorder, unspecified: Secondary | ICD-10-CM | POA: Diagnosis not present

## 2021-11-24 DIAGNOSIS — F419 Anxiety disorder, unspecified: Secondary | ICD-10-CM | POA: Diagnosis not present

## 2021-11-24 DIAGNOSIS — F331 Major depressive disorder, recurrent, moderate: Secondary | ICD-10-CM

## 2021-11-24 MED ORDER — HYDROXYZINE HCL 25 MG PO TABS: 25.0000 mg | ORAL_TABLET | Freq: Four times a day (QID) | ORAL | 0 refills | Status: DC

## 2021-11-24 MED ORDER — SERTRALINE HCL 100 MG PO TABS: 100.0000 mg | ORAL_TABLET | Freq: Every day | ORAL | 0 refills | Status: DC

## 2021-11-24 MED ORDER — OLANZAPINE 5 MG PO TABS: 5.0000 mg | ORAL_TABLET | Freq: Every day | ORAL | 0 refills | Status: DC

## 2021-11-24 NOTE — Progress Notes (Signed)
BH MD/PA/NP OP Progress Note  11/24/2021 4:28 PM Holly Vazquez  MRN:  [id number]  Chief Complaint:  Chief Complaint   Medication Management    HPI:  Holly Vazquez is a 41 yr old female [redacted]W[redacted]D G6P5006 who presents to establish care with worsening depression and anxiety.  PPHx is significant for MDD, Recurrent, Moderate, PMDD, Adjustment Disorder, Anxiety, PTSD.  No hospitalizations or suicide attempt.  She reports that her sleep is better.  She reports that she still has some issues with her mood in that one bad thing happens and that will "ruin" her day.  Started cooking again, cooked 4 times last week had not cooked for about a year prior.  She reports that she has moved into the apartment.  She reports that she is having some issues getting payments from the [facility] but that her financial situation has been more stable.   Discussed with her making a further increase in the Zyprexa to obtain better mood control.  She was agreeable to this.  Discussed with her should a crisis arise she can return here 24/7, go to [facility], go to the nearest ED, or call 911 or [phone].  She reports no SI, HI, or AVH.  She reports that her sleep has improved some.  She reports her appetite is doing good.  She reports understanding of the plan and has no other concerns at present.   Visit Diagnosis:    ICD-10-CM   1. MDD (major depressive disorder), recurrent episode, moderate (HCC)  F33.1 sertraline (ZOLOFT) 100 MG tablet    hydrOXYzine (ATARAX) 25 MG tablet    OLANZapine (ZYPREXA) 5 MG tablet    2. PTSD (post-traumatic stress disorder)  F43.10     3. Anxiety  F41.9       Past Psychiatric History: MDD, Recurrent, Moderate, PMDD, Adjustment Disorder, Anxiety, PTSD.  No hospitalizations or suicide attempt.  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   Anemia    due to heavy menses, on  iron   Anxiety    Cervical polyp 03/20/2018   Removed 03-20-18   Contact dermatitis and other eczema, due to unspecified cause    Depression    Fibroids    Heart murmur    States always told it was from anemia   Restless legs syndrome (RLS)    Urinary tract infection, site not specified     Past Surgical History:  Procedure Laterality Date   WISDOM TOOTH EXTRACTION      Family Psychiatric History: Father - Depression, Anxiety, Polysubstance abuse (EtOH, Crack, PCP) Mother - Depression, Anxiety, Crack abuse Maternal Grandmother - Anxiety Adopted Cousin - Suicide at age 27  Family History:  Family History  Problem Relation Age of Onset   Hypertension Father    Heart disease Maternal Grandmother    Stroke Maternal Grandmother    Hypertension Maternal Grandmother    Eczema Son    Eczema Son    Eczema Son    Lung cancer Mother     Social History:  Social History   Socioeconomic History   Marital status: Married    Spouse name: Not on file   Number of children: Not on file   Years of education: Not on file   Highest education level: Not on file  Occupational History   Not on file  Tobacco Use   Smoking status: Never  Smokeless tobacco: Never  Substance and Sexual Activity   Alcohol use: No   Drug use: Yes    Types: Marijuana   Sexual activity: Yes    Birth control/protection: None    Comment: Husband - vasectomy  Other Topics Concern   Not on file  Social History Narrative   Not on file   Social Determinants of Health   Financial Resource Strain: Not on file  Food Insecurity: Food Insecurity Present   Worried About Rew in the Last Year: Sometimes true   Ran Out of Food in the Last Year: Sometimes true  Transportation Needs: No Transportation Needs   Lack of Transportation (Medical): No   Lack of Transportation (Non-Medical): No  Physical Activity: Not on file  Stress: Not on file  Social Connections: Not on file    Allergies:   Allergies  Allergen Reactions   Flagyl [Metronidazole] Hives   Latex Hives   Nickel Dermatitis    trigger   Penicillins Rash    Metabolic Disorder Labs: Lab Results  Component Value Date   HGBA1C 5.7 (H) 07/11/2021   No results found for: PROLACTIN No results found for: CHOL, TRIG, HDL, CHOLHDL, VLDL, LDLCALC Lab Results  Component Value Date   TSH 0.803 07/11/2021   TSH 1.000 09/27/2017    Therapeutic Level Labs: No results found for: LITHIUM No results found for: VALPROATE No components found for:  CBMZ  Current Medications: Current Outpatient Medications  Medication Sig Dispense Refill   aspirin EC 81 MG tablet Take 1 tablet (81 mg total) by mouth daily. Take after 12 weeks for prevention of preeclampsia later in pregnancy 300 tablet 2   Blood Pressure Monitoring DEVI 1 each by Does not apply route once a week. 1 each 0   clindamycin (CLEOCIN) 300 MG capsule Take 1 capsule (300 mg total) by mouth 2 (two) times daily. 14 capsule 0   ferrous sulfate 325 (65 FE) MG tablet Take 1 tablet (325 mg total) by mouth every other day. 30 tablet 5   OLANZapine (ZYPREXA) 5 MG tablet Take 1 tablet (5 mg total) by mouth at bedtime. 30 tablet 0   ondansetron (ZOFRAN ODT) 4 MG disintegrating tablet Take 1 tablet (4 mg total) by mouth every 6 (six) hours as needed for nausea. 20 tablet 0   Prenatal Vit-Fe Fumarate-FA (PREPLUS) 27-1 MG TABS Take 1 tablet by mouth daily. 30 tablet 13   promethazine (PHENERGAN) 25 MG tablet Take 1 tablet (25 mg total) by mouth every 6 (six) hours as needed for nausea or vomiting. 30 tablet 0   terconazole (TERAZOL 7) 0.4 % vaginal cream Place 1 applicator vaginally at bedtime. 45 g 0   hydrOXYzine (ATARAX) 25 MG tablet Take 1 tablet (25 mg total) by mouth 4 (four) times daily. Take 1 tablet (25 mg) at morning and noon. Take 2 tablets (50 mg) at Night 120 tablet 0   sertraline (ZOLOFT) 100 MG tablet Take 1 tablet (100 mg total) by mouth daily. 30 tablet 0    No current facility-administered medications for this visit.     Musculoskeletal: Strength & Muscle Tone: within normal limits Gait & Station: normal Patient leans: N/A  Psychiatric Specialty Exam: Review of Systems  Respiratory:  Negative for shortness of breath.   Cardiovascular:  Negative for chest pain and palpitations.  Gastrointestinal:  Negative for abdominal pain, constipation, diarrhea and vomiting.  Neurological:  Negative for dizziness and weakness.  Psychiatric/Behavioral:  Negative for agitation and  suicidal ideas. The patient is not nervous/anxious.    Blood pressure 131/64, pulse 84, height 5' 4.5" (1.638 m), weight 154 lb (69.9 kg), last menstrual period 04/22/2021.Body mass index is 26.03 kg/m.  General Appearance: Casual and Fairly Groomed  Eye Contact:  Good  Speech:  Clear and Coherent and Normal Rate  Volume:  Normal  Mood:  Euthymic  Affect:  Appropriate and Congruent  Thought Process:  Coherent and Goal Directed  Orientation:  Full (Time, Place, and Person)  Thought Content: Logical   Suicidal Thoughts:  No  Homicidal Thoughts:  No  Memory:  Immediate;   Good Recent;   Good  Judgement:  Good  Insight:  Good  Psychomotor Activity:  Normal  Concentration:  Concentration: Good and Attention Span: Good  Recall:  Good  Fund of Knowledge: Good  Language: Good  Akathisia:  Negative  Handed:  Right  AIMS (if indicated): done AIMS=0 No cogwheeling or rigidity present  Assets:  Communication Skills Desire for Improvement Housing Physical Health Resilience  ADL's:  Intact  Cognition: WNL  Sleep:  Good   Screenings: GAD-7    Flowsheet Row Routine Prenatal from [facility]17/2021 in [place] [facility] Routine Prenatal from 11/02/2021 in [place] [facility] [employer] [facility] [employer] [facility] for [facility] Routine Prenatal from 10/19/2021 in [place] [facility] [employer] [facility] Routine Prenatal  from 09/07/2021 in [facility] Initial Prenatal from 07/11/2021 in [facility] for [place] [facility]  Total GAD-7 Score 7 15 21 21 10       PHQ2-9    Flowsheet Row Routine Prenatal from 11/02/2021 in [facility] [place] [facility] Women Routine Prenatal from 10/19/2021 in [place] for [place] [facility] for Women Routine Prenatal from 09/07/2021 in [facility] for [facility] Counselor from 09/06/2021 in [facility] Initial Prenatal from 07/11/2021 in [facility] for [place] at [facility] for Women  PHQ-2 Total Score 4 6 6 6 4   PHQ-9 Total Score 18 23 23 24 11       Flowsheet Row Counselor from 09/06/2021 in [place] No Risk        Assessment and Plan:  Holly Vazquez has had some improvement with starting Zyprexa.  She has improvement in sleep with no side effects so we will increase Zyprexa to 5 mg.  I will see her back in 2 to 3 weeks to reassess.  Discussed with her again if she finds her self in a crisis she can return to [facility], go to [facility], go to the nearest ED, or call 911 or [phone].   MDD, Recurrent, Mild   PTSD: -Continue Zoloft 100 mg daily -Continue Hydroxyzine 25 mg AM, 25 mg Noon, and 100 mg QHS -Increase Zyprexa 5 mg QHS.   Briant Cedar, MD 11/24/2021, 4:28 PM

## 2021-11-28 ENCOUNTER — Ambulatory Visit (HOSPITAL_COMMUNITY): Payer: Medicaid Other | Admitting: Licensed Clinical Social Worker

## 2021-11-28 ENCOUNTER — Encounter (HOSPITAL_COMMUNITY): Payer: Self-pay

## 2021-12-02 ENCOUNTER — Ambulatory Visit: Payer: Medicaid Other

## 2021-12-02 ENCOUNTER — Ambulatory Visit: Payer: Medicaid Other | Attending: Obstetrics and Gynecology

## 2021-12-02 ENCOUNTER — Ambulatory Visit: Payer: Medicaid Other | Admitting: *Deleted

## 2021-12-02 ENCOUNTER — Encounter: Payer: Self-pay | Admitting: *Deleted

## 2021-12-02 ENCOUNTER — Other Ambulatory Visit: Payer: Self-pay | Admitting: *Deleted

## 2021-12-02 ENCOUNTER — Other Ambulatory Visit: Payer: Self-pay

## 2021-12-02 VITALS — BP 140/73 | HR 84

## 2021-12-02 DIAGNOSIS — D560 Alpha thalassemia: Secondary | ICD-10-CM

## 2021-12-02 DIAGNOSIS — O09522 Supervision of elderly multigravida, second trimester: Secondary | ICD-10-CM | POA: Diagnosis not present

## 2021-12-02 DIAGNOSIS — O09523 Supervision of elderly multigravida, third trimester: Secondary | ICD-10-CM

## 2021-12-02 DIAGNOSIS — O0990 Supervision of high risk pregnancy, unspecified, unspecified trimester: Secondary | ICD-10-CM

## 2021-12-02 DIAGNOSIS — Z3A32 32 weeks gestation of pregnancy: Secondary | ICD-10-CM | POA: Diagnosis not present

## 2021-12-02 DIAGNOSIS — G129 Spinal muscular atrophy, unspecified: Secondary | ICD-10-CM

## 2021-12-02 DIAGNOSIS — O403XX0 Polyhydramnios, third trimester, not applicable or unspecified: Secondary | ICD-10-CM | POA: Diagnosis not present

## 2021-12-02 DIAGNOSIS — D259 Leiomyoma of uterus, unspecified: Secondary | ICD-10-CM

## 2021-12-05 ENCOUNTER — Encounter: Payer: Medicaid Other | Admitting: Obstetrics and Gynecology

## 2021-12-09 ENCOUNTER — Telehealth: Payer: Medicaid Other | Admitting: Nurse Practitioner

## 2021-12-09 ENCOUNTER — Other Ambulatory Visit: Payer: Self-pay

## 2021-12-09 ENCOUNTER — Other Ambulatory Visit: Payer: Self-pay | Admitting: Obstetrics and Gynecology

## 2021-12-09 ENCOUNTER — Ambulatory Visit: Payer: Medicaid Other | Admitting: *Deleted

## 2021-12-09 ENCOUNTER — Ambulatory Visit: Payer: Medicaid Other | Attending: Obstetrics

## 2021-12-09 ENCOUNTER — Other Ambulatory Visit: Payer: Self-pay | Admitting: *Deleted

## 2021-12-09 VITALS — BP 128/67 | HR 82

## 2021-12-09 DIAGNOSIS — D259 Leiomyoma of uterus, unspecified: Secondary | ICD-10-CM | POA: Insufficient documentation

## 2021-12-09 DIAGNOSIS — O3413 Maternal care for benign tumor of corpus uteri, third trimester: Secondary | ICD-10-CM | POA: Insufficient documentation

## 2021-12-09 DIAGNOSIS — O99320 Drug use complicating pregnancy, unspecified trimester: Secondary | ICD-10-CM

## 2021-12-09 DIAGNOSIS — O0943 Supervision of pregnancy with grand multiparity, third trimester: Secondary | ICD-10-CM

## 2021-12-09 DIAGNOSIS — O0990 Supervision of high risk pregnancy, unspecified, unspecified trimester: Secondary | ICD-10-CM | POA: Diagnosis present

## 2021-12-09 DIAGNOSIS — O09523 Supervision of elderly multigravida, third trimester: Secondary | ICD-10-CM | POA: Diagnosis not present

## 2021-12-09 DIAGNOSIS — O409XX0 Polyhydramnios, unspecified trimester, not applicable or unspecified: Secondary | ICD-10-CM

## 2021-12-09 DIAGNOSIS — B3731 Acute candidiasis of vulva and vagina: Secondary | ICD-10-CM

## 2021-12-09 DIAGNOSIS — Z3A33 33 weeks gestation of pregnancy: Secondary | ICD-10-CM

## 2021-12-09 NOTE — Progress Notes (Signed)
Virtual Visit Consent   Holly Vazquez, you are scheduled for a virtual visit with a [place] provider today.     Just as with appointments in the office, your consent must be obtained to participate.  Your consent will be active for this visit and any virtual visit you may have with one of our providers in the next 365 days.     If you have a MyChart account, a copy of this consent can be sent to you electronically.  All virtual visits are billed to your insurance company just like a traditional visit in the office.    As this is a virtual visit, video technology does not allow for your provider to perform a traditional examination.  This may limit your provider's ability to fully assess your condition.  If your provider identifies any concerns that need to be evaluated in person or the need to arrange testing (such as labs, EKG, etc.), we will make arrangements to do so.     Although advances in technology are sophisticated, we cannot ensure that it will always work on either your end or our end.  If the connection with a video visit is poor, the visit may have to be switched to a telephone visit.  With either a video or telephone visit, we are not always able to ensure that we have a secure connection.     I need to obtain your verbal consent now.   Are you willing to proceed with your visit today?    Holly Vazquez has provided verbal consent on 12/09/2021 for a virtual visit (video or telephone).   Holly Schneiders, FNP   Date: 12/09/2021 4:26 PM   Virtual Visit via Video Note   I, Holly Vazquez, connected with  Holly Vazquez  ([id number], Oct 28, 1980) on 12/09/21 at  5:00 PM EST by a video-enabled telemedicine application and verified that I am speaking with the correct person using two identifiers.  Location: Patient: Virtual Visit Location Patient: Home Provider: Virtual Visit Location Provider: Home Office   I discussed the limitations of evaluation and management by  telemedicine and the availability of in person appointments. The patient expressed understanding and agreed to proceed.    History of Present Illness: Holly Vazquez is a 41 y.o. who identifies as a female who was assigned female at birth, and is being seen today with complaints of vaginal itching and yellow discharge without any odor. The discharge is thick. She is [redacted] weeks pregnant. She was treated for BV 7-8 weeks ago. This is her 6th pregnancy.   She was recently sexually active- this seems to always instigate either BV or yeast. With BV infections she has been able to identify the odor in the past that is not present with current symptoms  Denies vaginal bleeding or other changes in pregnancy status  Reviews most recent OB note from 11/17/21  She will be seeing OB next week    Problems:  Patient Active Problem List   Diagnosis Date Noted   [place] multipara 11/02/2021   Uterine fibroid complicating antenatal care, baby not yet delivered 11/02/2021   Supervision of high risk pregnancy due to social problems, third trimester 11/02/2021   PTSD (post-traumatic stress disorder) 10/20/2021   MDD (major depressive disorder), recurrent episode, moderate ([place]) 10/20/2021   Food insecurity 09/21/2021   Adjustment disorder with mixed anxiety and depressed mood 09/05/2021   Cannabis use disorder 09/05/2021   Tetrahydrocannabinol (THC) use disorder, mild, abuse 08/08/2021   Alpha thalassemia silent  carrier 08/05/2021   Supervision of high risk pregnancy, antepartum 05/23/2021   AMA (advanced maternal age) multigravida 35+ 05/23/2021   Anxiety 09/22/2020   PMDD (premenstrual dysphoric disorder) 07/28/2020   Anemia 07/06/2011    Allergies:  Allergies  Allergen Reactions   Flagyl [Metronidazole] Hives   Latex Hives   Nickel Dermatitis    trigger   Penicillins Rash   Medications:  Current Outpatient Medications:    aspirin EC 81 MG tablet, Take 1 tablet (81 mg total) by mouth daily. Take  after 12 weeks for prevention of preeclampsia later in pregnancy, Disp: 300 tablet, Rfl: 2   Blood Pressure Monitoring DEVI, 1 each by Does not apply route once a week., Disp: 1 each, Rfl: 0   clindamycin (CLEOCIN) 300 MG capsule, Take 1 capsule (300 mg total) by mouth 2 (two) times daily., Disp: 14 capsule, Rfl: 0   ferrous sulfate 325 (65 FE) MG tablet, Take 1 tablet (325 mg total) by mouth every other day., Disp: 30 tablet, Rfl: 5   hydrOXYzine (ATARAX) 25 MG tablet, Take 1 tablet (25 mg total) by mouth 4 (four) times daily. Take 1 tablet (25 mg) at morning and noon. Take 2 tablets (50 mg) at Night, Disp: 120 tablet, Rfl: 0   OLANZapine (ZYPREXA) 5 MG tablet, Take 1 tablet (5 mg total) by mouth at bedtime., Disp: 30 tablet, Rfl: 0   ondansetron (ZOFRAN ODT) 4 MG disintegrating tablet, Take 1 tablet (4 mg total) by mouth every 6 (six) hours as needed for nausea., Disp: 20 tablet, Rfl: 0   Prenatal Vit-Fe Fumarate-FA (PREPLUS) 27-1 MG TABS, Take 1 tablet by mouth daily., Disp: 30 tablet, Rfl: 13   promethazine (PHENERGAN) 25 MG tablet, Take 1 tablet (25 mg total) by mouth every 6 (six) hours as needed for nausea or vomiting., Disp: 30 tablet, Rfl: 0   sertraline (ZOLOFT) 100 MG tablet, Take 1 tablet (100 mg total) by mouth daily., Disp: 30 tablet, Rfl: 0   terconazole (TERAZOL 7) 0.4 % vaginal cream, Place 1 applicator vaginally at bedtime., Disp: 45 g, Rfl: 0  Observations/Objective: Patient is well-developed, well-nourished in no acute distress.  Resting comfortably  at home.  Head is normocephalic, atraumatic.  No labored breathing.  Speech is clear and coherent with logical content.  Patient is alert and oriented at baseline.    Assessment and Plan: 1. Vaginal yeast infection Given recent BV treatment and based on current symptoms most likely yeast infection.   Patient advised to get immediate care with any vaginal bleeding or acute changes to pregnancy status as discussed- patient well  aware  She will use Monistat OTC for current symptoms, this is safest option given uncertainly of diagnosis via tele helath, pregnancy status. If no improvement or with worsening symptoms she should be seen at UC over the weekend and by OB next week as planned.   Patient is aware and able to purchase Monistat OTC      Follow Up Instructions: I discussed the assessment and treatment plan with the patient. The patient was provided an opportunity to ask questions and all were answered. The patient agreed with the plan and demonstrated an understanding of the instructions.  A copy of instructions were sent to the patient via MyChart unless otherwise noted below.    The patient was advised to call back or seek an in-person evaluation if the symptoms worsen or if the condition fails to improve as anticipated.  Time:  I spent 10 minutes with  the patient via telehealth technology discussing the above problems/concerns.    Holly Schneiders, FNP

## 2021-12-13 NOTE — Telephone Encounter (Signed)
Pt needs to have nurse appt for swab. Pt agreeable to plan of care.  Colletta Maryland, RN

## 2021-12-14 ENCOUNTER — Other Ambulatory Visit: Payer: Self-pay

## 2021-12-14 ENCOUNTER — Ambulatory Visit (INDEPENDENT_AMBULATORY_CARE_PROVIDER_SITE_OTHER): Payer: Medicaid Other | Admitting: Family Medicine

## 2021-12-14 ENCOUNTER — Encounter: Payer: Self-pay | Admitting: Family Medicine

## 2021-12-14 VITALS — BP 125/76 | HR 98 | Wt 169.2 lb

## 2021-12-14 DIAGNOSIS — N898 Other specified noninflammatory disorders of vagina: Secondary | ICD-10-CM

## 2021-12-14 DIAGNOSIS — Z641 Problems related to multiparity: Secondary | ICD-10-CM

## 2021-12-14 DIAGNOSIS — O0990 Supervision of high risk pregnancy, unspecified, unspecified trimester: Secondary | ICD-10-CM

## 2021-12-14 DIAGNOSIS — Z3009 Encounter for other general counseling and advice on contraception: Secondary | ICD-10-CM | POA: Insufficient documentation

## 2021-12-14 DIAGNOSIS — D259 Leiomyoma of uterus, unspecified: Secondary | ICD-10-CM

## 2021-12-14 DIAGNOSIS — F419 Anxiety disorder, unspecified: Secondary | ICD-10-CM

## 2021-12-14 DIAGNOSIS — O09523 Supervision of elderly multigravida, third trimester: Secondary | ICD-10-CM

## 2021-12-14 DIAGNOSIS — D649 Anemia, unspecified: Secondary | ICD-10-CM

## 2021-12-14 DIAGNOSIS — O3410 Maternal care for benign tumor of corpus uteri, unspecified trimester: Secondary | ICD-10-CM

## 2021-12-14 NOTE — Progress Notes (Signed)
? ?  Subjective:  ?Holly Vazquez is a 41 y.o. G6P5006 at [redacted]w[redacted]d being seen today for ongoing prenatal care.  She is currently monitored for the following issues for this high-risk pregnancy and has Anemia; PMDD (premenstrual dysphoric disorder); Anxiety; Supervision of high risk pregnancy, antepartum; AMA (advanced maternal age) multigravida 42+; Alpha thalassemia silent carrier; Tetrahydrocannabinol (THC) use disorder, mild, abuse; Adjustment disorder with mixed anxiety and depressed mood; Cannabis use disorder; Food insecurity; PTSD (post-traumatic stress disorder); MDD (major depressive disorder), recurrent episode, moderate ([place]); [place] multipara; Uterine fibroid complicating antenatal care, baby not yet delivered; and Supervision of high risk pregnancy due to social problems, third trimester on their problem list. ? ?Patient reports  concern about weight gain. Has been compliant with Olanzapine .  Contractions: Irritability. Vag. Bleeding: None.  Movement: Present. Denies leaking of fluid.  ? ?The following portions of the patient's history were reviewed and updated as appropriate: allergies, current medications, past family history, past medical history, past social history, past surgical history and problem list. Problem list updated. ? ?Objective:  ? ?Vitals:  ? 12/14/21 1139  ?BP: 125/76  ?Pulse: 98  ?Weight: 169 lb 3.2 oz (76.7 kg)  ? ? ?Fetal Status: Fetal Heart Rate (bpm): 146   Movement: Present    ? ?General:  Alert, oriented and cooperative. Patient is in no acute distress.  ?Skin: Skin is warm and dry. No rash noted.   ?Cardiovascular: Normal heart rate noted  ?Respiratory: Normal respiratory effort, no problems with respiration noted  ?Abdomen: Soft, gravid, appropriate for gestational age. Pain/Pressure: Present     ?Pelvic: Vag. Bleeding: None Vag D/C Character: Mucous   ?Cervical exam deferred        ?Extremities: Normal range of motion.  Edema: None  ?Mental Status: Normal mood and affect.  Normal behavior. Normal judgment and thought content.  ? ?Urinalysis:     ? ?Assessment and Plan:  ?Pregnancy: [id number] at [redacted]w[redacted]d ? ?1. Vaginal discharge ?Self swab ?- Cervicovaginal ancillary only( [place]) ? ?2. Supervision of high risk pregnancy, antepartum ?BP and FHR normal ?Long discussion around post partum BTL, how procedure is done, as well as benefits of a salpingectomy ?Reviewed anatomy diagrams ?After discussion she is planning to go through with postpartum salpingectomy ? ?3. Uterine fibroid complicating antenatal care, baby not yet delivered ?Anterior, 2.9 cm ? ?4. Anemia, unspecified type ?Lab Results  ?Component Value Date  ? HGB 10.6 (L) 11/02/2021  ? ? ?5. Anxiety ?stable ? ?6. Multigravida of advanced maternal age in third trimester ? ? ?7. [place] multipara ?TXA at delivery ? ?Preterm labor symptoms and general obstetric precautions including but not limited to vaginal bleeding, contractions, leaking of fluid and fetal movement were reviewed in detail with the patient. ?Please refer to After Visit Summary for other counseling recommendations.  ?Return in 2 weeks (on 12/28/2021) for [facility], ob visit. ? ? ?Clarnce Flock, MD ? ?

## 2021-12-14 NOTE — Patient Instructions (Signed)

## 2021-12-15 ENCOUNTER — Encounter (HOSPITAL_COMMUNITY): Payer: Self-pay | Admitting: Student in an Organized Health Care Education/Training Program

## 2021-12-15 ENCOUNTER — Ambulatory Visit (INDEPENDENT_AMBULATORY_CARE_PROVIDER_SITE_OTHER): Payer: Medicaid Other | Admitting: Student in an Organized Health Care Education/Training Program

## 2021-12-15 VITALS — BP 121/70 | HR 83 | Ht 64.5 in | Wt 166.0 lb

## 2021-12-15 DIAGNOSIS — F4310 Post-traumatic stress disorder, unspecified: Secondary | ICD-10-CM

## 2021-12-15 DIAGNOSIS — F419 Anxiety disorder, unspecified: Secondary | ICD-10-CM

## 2021-12-15 DIAGNOSIS — F331 Major depressive disorder, recurrent, moderate: Secondary | ICD-10-CM

## 2021-12-15 MED ORDER — SERTRALINE HCL 100 MG PO TABS: 100.0000 mg | ORAL_TABLET | Freq: Every day | ORAL | 0 refills | Status: DC

## 2021-12-15 MED ORDER — OLANZAPINE 5 MG PO TABS: 5.0000 mg | ORAL_TABLET | Freq: Every day | ORAL | 0 refills | Status: DC

## 2021-12-15 MED ORDER — HYDROXYZINE HCL 25 MG PO TABS: 25.0000 mg | ORAL_TABLET | Freq: Four times a day (QID) | ORAL | 0 refills | Status: DC

## 2021-12-15 NOTE — Progress Notes (Signed)
BH MD/PA/NP OP Progress Note  12/15/2021 1:37 PM Holly Vazquez  MRN:  [id number]  Chief Complaint:  Chief Complaint  Patient presents with   Medication Management   HPI: Holly Vazquez is a 41 yr old female [redacted]W[redacted]D [phone] who presents for continued medication management for her depression and anxiety.  PPHx is significant for MDD, Recurrent, Moderate, PMDD, Adjustment Disorder, Anxiety, PTSD.  No hospitalizations or suicide attempt.  She reports she has had some improvements since our last meeting.  She reports that her mood has had overall improvement, however, she reports when she has something bad happen it still "deflates" her.  She explains if a debt collector calls it still can ruin a good portion of her day. She does report that these "episodes of defeat" are happening less overall.    She reports her sleep is ok.  She reports that she still can have trouble falling asleep and some times it takes an hour plus.  She does report drinking soda often.  Discussed with her about only drinking caffeine free soda after noon to see if this helps her fall asleep sooner.  Discussed with her what to do in the event of a future crisis.  Discussed that she can return to [facility], go to the [facility], go to the nearest ED, or call [phone] or [phone].   She reported understanding and had no concerns.  She reports no SI, HI, or AVH.    Visit Diagnosis:    ICD-10-CM   1. MDD (major depressive disorder), recurrent episode, moderate (HCC)  F33.1 sertraline (ZOLOFT) 100 MG tablet    OLANZapine (ZYPREXA) 5 MG tablet    hydrOXYzine (ATARAX) 25 MG tablet    2. PTSD (post-traumatic stress disorder)  F43.10     3. Anxiety  F41.9       Past Psychiatric History:  MDD, Recurrent, Moderate, PMDD, Adjustment Disorder, Anxiety, PTSD.  No hospitalizations or suicide attempt.  Past Medical History:  Past Medical History:  Diagnosis Date   Absence of menstruation    Allergic rhinitis 07/26/2015   Allergic urticaria  07/26/2015   Anemia    due to heavy menses, on iron   Anxiety    Cervical polyp 03/20/2018   Removed 03-20-18   Contact dermatitis and other eczema, due to unspecified cause    Depression    Fibroids    Heart murmur    States always told it was from anemia   Restless legs syndrome (RLS)    Urinary tract infection, site not specified     Past Surgical History:  Procedure Laterality Date   WISDOM TOOTH EXTRACTION      Family Psychiatric History: Father - Depression, Anxiety, Polysubstance abuse (EtOH, Crack, PCP) Mother - Depression, Anxiety, Crack abuse Maternal Grandmother - Anxiety Adopted Cousin - Suicide at age 39  Family History:  Family History  Problem Relation Age of Onset   Hypertension Father    Heart disease Maternal Grandmother    Stroke Maternal Grandmother    Hypertension Maternal Grandmother    Eczema Son    Eczema Son    Eczema Son    Lung cancer Mother     Social History:  Social History   Socioeconomic History   Marital status: Married    Spouse name: Not on file   Number of children: Not on file   Years of education: Not on file   Highest education level: Not on file  Occupational History   Not on file  Tobacco Use  Smoking status: Never   Smokeless tobacco: Never  Substance and Sexual Activity   Alcohol use: No   Drug use: Yes    Types: Marijuana   Sexual activity: Yes    Birth control/protection: None    Comment: Husband - vasectomy  Other Topics Concern   Not on file  Social History Narrative   Not on file   Social Determinants of Health   Financial Resource Strain: Not on file  Food Insecurity: Food Insecurity Present   Worried About [place] in the Last Year: Sometimes true   Ran Out of Food in the Last Year: Sometimes true  Transportation Needs: No Transportation Needs   Lack of Transportation (Medical): No   Lack of Transportation (Non-Medical): No  Physical Activity: Not on file  Stress: Not on file  Social  Connections: Not on file    Allergies:  Allergies  Allergen Reactions   Flagyl [Metronidazole] Hives   Latex Hives   Nickel Dermatitis    trigger   Penicillins Rash    Metabolic Disorder Labs: Lab Results  Component Value Date   HGBA1C 5.7 (H) 07/11/2021   No results found for: PROLACTIN No results found for: CHOL, TRIG, HDL, CHOLHDL, VLDL, LDLCALC Lab Results  Component Value Date   TSH 0.803 07/11/2021   TSH 1.000 09/27/2017    Therapeutic Level Labs: No results found for: LITHIUM No results found for: VALPROATE No components found for:  CBMZ  Current Medications: Current Outpatient Medications  Medication Sig Dispense Refill   aspirin EC 81 MG tablet Take 1 tablet (81 mg total) by mouth daily. Take after 12 weeks for prevention of preeclampsia later in pregnancy 300 tablet 2   Blood Pressure Monitoring DEVI 1 each by Does not apply route once a week. 1 each 0   clindamycin (CLEOCIN) 300 MG capsule Take 1 capsule (300 mg total) by mouth 2 (two) times daily. 14 capsule 0   ferrous sulfate 325 (65 FE) MG tablet Take 1 tablet (325 mg total) by mouth every other day. 30 tablet 5   hydrOXYzine (ATARAX) 25 MG tablet Take 1 tablet (25 mg total) by mouth 4 (four) times daily. Take 1 tablet (25 mg) at morning and noon. Take 2 tablets (50 mg) at Night 120 tablet 0   OLANZapine (ZYPREXA) 5 MG tablet Take 1 tablet (5 mg total) by mouth at bedtime. 30 tablet 0   ondansetron (ZOFRAN ODT) 4 MG disintegrating tablet Take 1 tablet (4 mg total) by mouth every 6 (six) hours as needed for nausea. 20 tablet 0   Prenatal Vit-Fe Fumarate-FA (PREPLUS) 27-1 MG TABS Take 1 tablet by mouth daily. 30 tablet 13   promethazine (PHENERGAN) 25 MG tablet Take 1 tablet (25 mg total) by mouth every 6 (six) hours as needed for nausea or vomiting. 30 tablet 0   sertraline (ZOLOFT) 100 MG tablet Take 1 tablet (100 mg total) by mouth daily. 30 tablet 0   terconazole (TERAZOL 7) 0.4 % vaginal cream Place 1  applicator vaginally at bedtime. 45 g 0   No current facility-administered medications for this visit.     Musculoskeletal: Strength & Muscle Tone: within normal limits Gait & Station: normal Patient leans: N/A  Psychiatric Specialty Exam: Review of Systems  Neurological:  Negative for dizziness, weakness and headaches.  Psychiatric/Behavioral:  Positive for sleep disturbance (mild). Negative for agitation, behavioral problems, hallucinations and suicidal ideas. The patient is not nervous/anxious.    Blood pressure 121/70, pulse 83,  height 5' 4.5" (1.638 m), weight 166 lb (75.3 kg), last menstrual period 04/22/2021.Body mass index is 28.05 kg/m.  General Appearance: Casual and Fairly Groomed  Eye Contact:  Good  Speech:  Clear and Coherent and Normal Rate  Volume:  Normal  Mood:  Euthymic  Affect:  Appropriate and Congruent  Thought Process:  Coherent and Goal Directed  Orientation:  Full (Time, Place, and Person)  Thought Content: Logical   Suicidal Thoughts:  No  Homicidal Thoughts:  No  Memory:  Immediate;   Good Recent;   Good  Judgement:  Good  Insight:  Good  Psychomotor Activity:  Normal  Concentration:  Concentration: Good and Attention Span: Good  Recall:  Good  Fund of Knowledge: Good  Language: Good  Akathisia:  Negative  Handed:  Right  AIMS (if indicated): done AIMS=0 no cogwheeling or rigidity present  Assets:  Communication Skills Desire for Improvement Financial Resources/Insurance Housing Resilience Social Support  ADL's:  Intact  Cognition: WNL  Sleep:  Fair   Screenings: GAD-7    Flowsheet Row Routine Prenatal from 12/14/2021 in [facility] for [facility] Routine Prenatal from 11/17/2021 in [place] for [facility] at [facility] Routine Prenatal from 11/02/2021 in [place] for [facility] at [facility] Routine Prenatal from 10/19/2021 in [facility] for [place] at [facility] Routine Prenatal from 09/07/2021 in [facility] [place] [facility]  Total GAD-7 Score 5 7 15 21 21       PHQ2-9    Flowsheet Row Routine Prenatal from 12/14/2021 in [facility] [place] at [facility] Routine Prenatal from 11/02/2021 in [place] for [place] at [facility] for Women Routine Prenatal from 10/19/2021 in [facility] [place] at [facility] for [facility] Routine Prenatal from 09/07/2021 in [facility] for [facility] at [facility] Counselor from 09/06/2021 in [place]  PHQ-2 Total Score 2 4 6 6 6   PHQ-9 Total Score 5 18 23 23 24       Flowsheet Row Counselor from 09/06/2021 in [place] No Risk        Assessment and Plan:  Colby is making steady positive progress.  Given she is at ~34 weeks her sleep will be disrupted already but modification of her diet may prove helpful and she will trial switching to caffeine free sodas in the afternoon/evening.  We will not make any medication changes at this time.  I will see her back in 4 weeks.   MDD, Recurrent, Mild   PTSD: -Continue Zoloft 100 mg daily -Continue Hydroxyzine 25 mg AM, 25 mg Noon, and 100 mg QHS -Continue Zyprexa 5 mg QHS   Collaboration of Care: Collaboration of Care: Discussed with Supervising Physician Dr. Serafina Mitchell Patient/Guardian was advised Release of Information must be obtained prior to any record release in order to collaborate their care with an outside provider. Patient/Guardian was advised if they have not already done so to contact the registration department to sign all necessary forms in order for [place] to release information regarding their care.   Consent: Patient/Guardian gives verbal consent for treatment and assignment of benefits for services provided  during this visit. Patient/Guardian expressed understanding and agreed to proceed.    Briant Cedar, MD 12/15/2021, 1:37 PM

## 2021-12-16 ENCOUNTER — Ambulatory Visit: Payer: Medicaid Other | Attending: Obstetrics | Admitting: *Deleted

## 2021-12-16 ENCOUNTER — Other Ambulatory Visit: Payer: Self-pay

## 2021-12-16 ENCOUNTER — Encounter: Payer: Self-pay | Admitting: *Deleted

## 2021-12-16 ENCOUNTER — Ambulatory Visit (HOSPITAL_BASED_OUTPATIENT_CLINIC_OR_DEPARTMENT_OTHER): Payer: Medicaid Other | Admitting: *Deleted

## 2021-12-16 ENCOUNTER — Ambulatory Visit: Payer: Medicaid Other

## 2021-12-16 VITALS — BP 122/63 | HR 84

## 2021-12-16 DIAGNOSIS — O09523 Supervision of elderly multigravida, third trimester: Secondary | ICD-10-CM | POA: Insufficient documentation

## 2021-12-16 DIAGNOSIS — Z3A34 34 weeks gestation of pregnancy: Secondary | ICD-10-CM | POA: Diagnosis not present

## 2021-12-16 DIAGNOSIS — Z3689 Encounter for other specified antenatal screening: Secondary | ICD-10-CM | POA: Diagnosis not present

## 2021-12-16 DIAGNOSIS — O0990 Supervision of high risk pregnancy, unspecified, unspecified trimester: Secondary | ICD-10-CM

## 2021-12-16 NOTE — Procedures (Signed)
Yaslin Vi ?August 16, 1981 ?[redacted]w[redacted]d ? ?Fetus A Non-Stress Test Interpretation for 12/16/21 ? ?Indication: Advanced Maternal Age >40 years ? ?Fetal Heart Rate A ?Mode: External ?Baseline Rate (A): 140 bpm ?Variability: Moderate ?Accelerations: 15 x 15 ?Decelerations: None ? ?Uterine Activity ?Mode: Toco ?Contraction Frequency (min): rare (1 u/c in 46 min) ?Contraction Duration (sec): 50 ?Contraction Quality: Mild ?Resting Tone Palpated: Relaxed ? ?Interpretation (Fetal Testing) ?Nonstress Test Interpretation: Reactive ?Overall Impression: Reassuring for gestational age ?Comments: tracing reviewed by Dr. Annamaria Boots ? ? ?

## 2021-12-21 ENCOUNTER — Encounter: Payer: Self-pay | Admitting: Obstetrics and Gynecology

## 2021-12-22 ENCOUNTER — Encounter (HOSPITAL_COMMUNITY): Payer: Medicaid Other | Admitting: Student in an Organized Health Care Education/Training Program

## 2021-12-23 ENCOUNTER — Ambulatory Visit: Payer: Medicaid Other | Admitting: *Deleted

## 2021-12-23 ENCOUNTER — Other Ambulatory Visit: Payer: Self-pay | Admitting: Obstetrics

## 2021-12-23 ENCOUNTER — Ambulatory Visit: Payer: Medicaid Other | Attending: Obstetrics

## 2021-12-23 ENCOUNTER — Other Ambulatory Visit: Payer: Self-pay

## 2021-12-23 VITALS — BP 123/69 | HR 88

## 2021-12-23 DIAGNOSIS — D259 Leiomyoma of uterus, unspecified: Secondary | ICD-10-CM

## 2021-12-23 DIAGNOSIS — O0990 Supervision of high risk pregnancy, unspecified, unspecified trimester: Secondary | ICD-10-CM

## 2021-12-23 DIAGNOSIS — O09523 Supervision of elderly multigravida, third trimester: Secondary | ICD-10-CM

## 2021-12-23 DIAGNOSIS — O3413 Maternal care for benign tumor of corpus uteri, third trimester: Secondary | ICD-10-CM | POA: Diagnosis not present

## 2021-12-23 DIAGNOSIS — F1290 Cannabis use, unspecified, uncomplicated: Secondary | ICD-10-CM

## 2021-12-23 DIAGNOSIS — Z3A35 35 weeks gestation of pregnancy: Secondary | ICD-10-CM

## 2021-12-23 DIAGNOSIS — O99322 Drug use complicating pregnancy, second trimester: Secondary | ICD-10-CM | POA: Diagnosis not present

## 2021-12-23 DIAGNOSIS — O0943 Supervision of pregnancy with grand multiparity, third trimester: Secondary | ICD-10-CM | POA: Diagnosis not present

## 2021-12-23 NOTE — Procedures (Signed)
Holly Vazquez ?11/02/1980 ?[redacted]w[redacted]d? ?Fetus A Non-Stress Test Interpretation for 12/23/21 ? ?Indication: Unsatisfactory BPP ? ?Fetal Heart Rate A ?Mode: External ?Baseline Rate (A): 130 bpm ?Variability: Moderate ?Accelerations: 15 x 15 ?Decelerations: None ?Multiple birth?: No ? ?Uterine Activity ?Mode: Toco ?Contraction Frequency (min): i u/c in 20 min ?Contraction Duration (sec): 70 ?Contraction Quality: Mild ?Resting Tone Palpated: Relaxed ?Resting Time: Adequate ? ?Interpretation (Fetal Testing) ?Nonstress Test Interpretation: Reactive ?Overall Impression: Reassuring for gestational age ?Comments: tracing reviewed by Dr. sDonalee Citrin? ? ?

## 2021-12-30 ENCOUNTER — Other Ambulatory Visit (HOSPITAL_COMMUNITY)
Admission: RE | Admit: 2021-12-30 | Discharge: 2021-12-30 | Disposition: A | Discharge status: Home / Self Care | Payer: Medicaid Other | Source: Ambulatory Visit | Attending: Obstetrics and Gynecology | Admitting: Obstetrics and Gynecology

## 2021-12-30 ENCOUNTER — Other Ambulatory Visit: Payer: Self-pay

## 2021-12-30 ENCOUNTER — Ambulatory Visit (INDEPENDENT_AMBULATORY_CARE_PROVIDER_SITE_OTHER): Payer: Medicaid Other | Admitting: Obstetrics and Gynecology

## 2021-12-30 ENCOUNTER — Encounter: Payer: Self-pay | Admitting: *Deleted

## 2021-12-30 ENCOUNTER — Ambulatory Visit: Payer: Medicaid Other | Admitting: *Deleted

## 2021-12-30 ENCOUNTER — Ambulatory Visit: Payer: Medicaid Other | Attending: Obstetrics

## 2021-12-30 VITALS — BP 127/79 | HR 91 | Wt 175.7 lb

## 2021-12-30 VITALS — BP 139/66 | HR 88

## 2021-12-30 DIAGNOSIS — O285 Abnormal chromosomal and genetic finding on antenatal screening of mother: Secondary | ICD-10-CM | POA: Diagnosis not present

## 2021-12-30 DIAGNOSIS — O3413 Maternal care for benign tumor of corpus uteri, third trimester: Secondary | ICD-10-CM

## 2021-12-30 DIAGNOSIS — Z3A36 36 weeks gestation of pregnancy: Secondary | ICD-10-CM

## 2021-12-30 DIAGNOSIS — O09523 Supervision of elderly multigravida, third trimester: Secondary | ICD-10-CM

## 2021-12-30 DIAGNOSIS — O3410 Maternal care for benign tumor of corpus uteri, unspecified trimester: Secondary | ICD-10-CM

## 2021-12-30 DIAGNOSIS — O0990 Supervision of high risk pregnancy, unspecified, unspecified trimester: Secondary | ICD-10-CM | POA: Insufficient documentation

## 2021-12-30 DIAGNOSIS — O0943 Supervision of pregnancy with grand multiparity, third trimester: Secondary | ICD-10-CM

## 2021-12-30 DIAGNOSIS — D563 Thalassemia minor: Secondary | ICD-10-CM

## 2021-12-30 DIAGNOSIS — D259 Leiomyoma of uterus, unspecified: Secondary | ICD-10-CM

## 2021-12-30 DIAGNOSIS — Z148 Genetic carrier of other disease: Secondary | ICD-10-CM

## 2021-12-30 NOTE — Progress Notes (Signed)
? ?  PRENATAL VISIT NOTE ? ?Subjective:  ?Holly Vazquez is a 41 y.o. G6P5006 at 73w0dbeing seen today for ongoing prenatal care.  She is currently monitored for the following issues for this high-risk pregnancy and has Anemia; PMDD (premenstrual dysphoric disorder); Anxiety; Supervision of high risk pregnancy, antepartum; AMA (advanced maternal age) multigravida 326+ Alpha thalassemia silent carrier; Tetrahydrocannabinol (THC) use disorder, mild, abuse; Adjustment disorder with mixed anxiety and depressed mood; Cannabis use disorder; Food insecurity; PTSD (post-traumatic stress disorder); MDD (major depressive disorder), recurrent episode, moderate ([place]; G[place]multipara; Uterine fibroid complicating antenatal care, baby not yet delivered; Supervision of high risk pregnancy due to social problems, third trimester; and Unwanted fertility on their problem list. ? ?Patient doing well with no acute concerns today. She reports  pelvic pressure .  Contractions: Irritability. Vag. Bleeding: None.  Movement: Present. Denies leaking of fluid.  ? ?The following portions of the patient's history were reviewed and updated as appropriate: allergies, current medications, past family history, past medical history, past social history, past surgical history and problem list. Problem list updated. ? ?Objective:  ? ?Vitals:  ? 12/30/21 0824  ?BP: 127/79  ?Pulse: 91  ?Weight: 175 lb 11.2 oz (79.7 kg)  ? ? ?Fetal Status: Fetal Heart Rate (bpm): 140 Fundal Height: 37 cm Movement: Present    ? ?General:  Alert, oriented and cooperative. Patient is in no acute distress.  ?Skin: Skin is warm and dry. No rash noted.   ?Cardiovascular: Normal heart rate noted  ?Respiratory: Normal respiratory effort, no problems with respiration noted  ?Abdomen: Soft, gravid, appropriate for gestational age.  Pain/Pressure: Present     ?Pelvic: Cervical exam deferred        ?Extremities: Normal range of motion.  Edema: Trace  ?Mental Status:  Normal mood  and affect. Normal behavior. Normal judgment and thought content.  ? ?Assessment and Plan:  ?Pregnancy: G6P5006 at 368w0d ?1. [redacted] weeks gestation of pregnancy ? ? ?2. Supervision of high risk pregnancy, antepartum ?Continue routine care, pt is ambivalent about her tubal ligation, not sure she wants another procedure, discussed IUD as an alternative if needed ? ?- [facility]+Rflx ? ?3. Multigravida of advanced maternal age in third trimester ?BPP today ? ?4. Alpha thalassemia silent carrier ? ? ?5. Uterine fibroid complicating antenatal care, baby not yet delivered ? ? ?Preterm labor symptoms and general obstetric precautions including but not limited to vaginal bleeding, contractions, leaking of fluid and fetal movement were reviewed in detail with the patient. ? ?Please refer to After Visit Summary for other counseling recommendations.  ? ?[facility] 1 [facility] (around 01/06/2022[facility] ? ? ?LaLynnda ShieldsMD ?Faculty Attending ?[facility]  ?

## 2022-01-02 ENCOUNTER — Telehealth (HOSPITAL_COMMUNITY): Payer: Self-pay

## 2022-01-02 LAB — GC/CHLAMYDIA PROBE AMP (~~LOC~~) NOT AT ARMC
Chlamydia: NEGATIVE
Comment: NEGATIVE
Comment: NORMAL
Neisseria Gonorrhea: NEGATIVE

## 2022-01-02 NOTE — Telephone Encounter (Signed)
RECEIVED FAX FROM [facility] ON [address] IN [place] REQUESTING A PA ON PT'S  ?OLANZAPINE '5MG'$  TABLET ?PRESCRIPTION COVERAGE: [employer] ?S/W PATRICIA ? ?SENT FOR REVIEW. WAITING ON DETERMINATION ?

## 2022-01-03 LAB — STREP GP B CULTURE+RFLX: Strep Gp B Culture+Rflx: NEGATIVE

## 2022-01-04 ENCOUNTER — Telehealth (HOSPITAL_COMMUNITY): Payer: Self-pay

## 2022-01-04 NOTE — Telephone Encounter (Signed)
[place] PRESCRIPTION COVERAGE ?APPROVED  OLANZAPINE '5MG'$  TABLET ?CASE ID # [id number] ?EFFECTIVE 01/02/2022 TO 01/03/2023 ? ?NOTIFIED PHARMACY & PT ? ? ?

## 2022-01-05 ENCOUNTER — Encounter (HOSPITAL_COMMUNITY): Payer: Medicaid Other | Admitting: Student in an Organized Health Care Education/Training Program

## 2022-01-06 ENCOUNTER — Ambulatory Visit: Payer: Medicaid Other | Attending: Obstetrics and Gynecology

## 2022-01-06 ENCOUNTER — Other Ambulatory Visit: Payer: Self-pay | Admitting: *Deleted

## 2022-01-06 ENCOUNTER — Ambulatory Visit: Payer: Medicaid Other | Admitting: *Deleted

## 2022-01-06 ENCOUNTER — Other Ambulatory Visit: Payer: Self-pay

## 2022-01-06 ENCOUNTER — Encounter: Payer: Self-pay | Admitting: *Deleted

## 2022-01-06 VITALS — BP 126/68 | HR 78

## 2022-01-06 DIAGNOSIS — O0943 Supervision of pregnancy with grand multiparity, third trimester: Secondary | ICD-10-CM

## 2022-01-06 DIAGNOSIS — O99320 Drug use complicating pregnancy, unspecified trimester: Secondary | ICD-10-CM | POA: Diagnosis present

## 2022-01-06 DIAGNOSIS — O09523 Supervision of elderly multigravida, third trimester: Secondary | ICD-10-CM

## 2022-01-06 DIAGNOSIS — O409XX0 Polyhydramnios, unspecified trimester, not applicable or unspecified: Secondary | ICD-10-CM | POA: Insufficient documentation

## 2022-01-06 DIAGNOSIS — O0990 Supervision of high risk pregnancy, unspecified, unspecified trimester: Secondary | ICD-10-CM | POA: Insufficient documentation

## 2022-01-06 DIAGNOSIS — O3413 Maternal care for benign tumor of corpus uteri, third trimester: Secondary | ICD-10-CM

## 2022-01-06 DIAGNOSIS — D259 Leiomyoma of uterus, unspecified: Secondary | ICD-10-CM

## 2022-01-06 DIAGNOSIS — O99323 Drug use complicating pregnancy, third trimester: Secondary | ICD-10-CM | POA: Diagnosis not present

## 2022-01-06 DIAGNOSIS — F1290 Cannabis use, unspecified, uncomplicated: Secondary | ICD-10-CM

## 2022-01-06 DIAGNOSIS — Z3A37 37 weeks gestation of pregnancy: Secondary | ICD-10-CM

## 2022-01-06 DIAGNOSIS — O403XX0 Polyhydramnios, third trimester, not applicable or unspecified: Secondary | ICD-10-CM

## 2022-01-09 ENCOUNTER — Ambulatory Visit (INDEPENDENT_AMBULATORY_CARE_PROVIDER_SITE_OTHER): Payer: Medicaid Other | Admitting: Obstetrics and Gynecology

## 2022-01-09 ENCOUNTER — Ambulatory Visit (HOSPITAL_COMMUNITY): Payer: Medicaid Other | Admitting: Licensed Clinical Social Worker

## 2022-01-09 ENCOUNTER — Other Ambulatory Visit: Payer: Self-pay

## 2022-01-09 VITALS — BP 135/73 | HR 82 | Wt 178.0 lb

## 2022-01-09 DIAGNOSIS — F1210 Cannabis abuse, uncomplicated: Secondary | ICD-10-CM

## 2022-01-09 DIAGNOSIS — O99613 Diseases of the digestive system complicating pregnancy, third trimester: Secondary | ICD-10-CM

## 2022-01-09 DIAGNOSIS — K219 Gastro-esophageal reflux disease without esophagitis: Secondary | ICD-10-CM

## 2022-01-09 DIAGNOSIS — O0990 Supervision of high risk pregnancy, unspecified, unspecified trimester: Secondary | ICD-10-CM

## 2022-01-09 DIAGNOSIS — D563 Thalassemia minor: Secondary | ICD-10-CM

## 2022-01-09 DIAGNOSIS — D259 Leiomyoma of uterus, unspecified: Secondary | ICD-10-CM

## 2022-01-09 DIAGNOSIS — Z3A37 37 weeks gestation of pregnancy: Secondary | ICD-10-CM

## 2022-01-09 DIAGNOSIS — O3410 Maternal care for benign tumor of corpus uteri, unspecified trimester: Secondary | ICD-10-CM

## 2022-01-09 DIAGNOSIS — O09523 Supervision of elderly multigravida, third trimester: Secondary | ICD-10-CM

## 2022-01-09 MED ORDER — FAMOTIDINE 20 MG PO TABS: 20.0000 mg | ORAL_TABLET | Freq: Two times a day (BID) | ORAL | 1 refills | Status: DC

## 2022-01-09 NOTE — Progress Notes (Signed)
? ?  PRENATAL VISIT NOTE ? ?Subjective:  ?Holly Vazquez is a 41 y.o. G6P5006 at 44w3dbeing seen today for ongoing prenatal care.  She is currently monitored for the following issues for this high-risk pregnancy and has Anemia; PMDD (premenstrual dysphoric disorder); Anxiety; Supervision of high risk pregnancy, antepartum; AMA (advanced maternal age) multigravida 380+ Alpha thalassemia silent carrier; Tetrahydrocannabinol (THC) use disorder, mild, abuse; Adjustment disorder with mixed anxiety and depressed mood; Cannabis use disorder; Food insecurity; PTSD (post-traumatic stress disorder); MDD (major depressive disorder), recurrent episode, moderate ([place]; [place]leymultipara; Uterine fibroid complicating antenatal care, baby not yet delivered; Supervision of high risk pregnancy due to social problems, third trimester; and Unwanted fertility on their problem list. ? ?Patient doing well with no acute concerns today. She reports no complaints.  Contractions: Irritability. Vag. Bleeding: None.  Movement: Present. Denies leaking of fluid.  ? ?The following portions of the patient's history were reviewed and updated as appropriate: allergies, current medications, past family history, past medical history, past social history, past surgical history and problem list. Problem list updated. ? ?Objective:  ? ?Vitals:  ? 01/09/22 1107  ?BP: 135/73  ?Pulse: 82  ?Weight: 178 lb (80.7 kg)  ? ? ?Fetal Status: Fetal Heart Rate (bpm): 146 Fundal Height: 39 cm Movement: Present    ? ?General:  Alert, oriented and cooperative. Patient is in no acute distress.  ?Skin: Skin is warm and dry. No rash noted.   ?Cardiovascular: Normal heart rate noted  ?Respiratory: Normal respiratory effort, no problems with respiration noted  ?Abdomen: Soft, gravid, appropriate for gestational age.  Pain/Pressure: Present     ?Pelvic: Cervical exam deferred        ?Extremities: Normal range of motion.  Edema: Trace  ?Mental Status:  Normal mood and affect.  Normal behavior. Normal judgment and thought content.  ? ?Assessment and Plan:  ?Pregnancy: GS3P5945at 346w3d ?1. [redacted] weeks gestation of pregnancy ? ? ?2. Uterine fibroid complicating antenatal care, baby not yet delivered ?Continue BPP per MFM ? ?3. Supervision of high risk pregnancy, antepartum ?Continue routine care ? ?4. Multigravida of advanced maternal age in third trimester ? ? ?5. Alpha thalassemia silent carrier ? ? ?6. Tetrahydrocannabinol (THC) use disorder, mild, abuse ?No report of continued use ? ?7. Gastroesophageal reflux during pregnancy in third trimester, antepartum ?Rx for pepcid due to reflux ?- famotidine (PEPCID) 20 MG tablet; Take 1 tablet (20 mg total) by mouth 2 (two) times daily.  Dispense: 60 tablet; Refill: 1 ? ?Term labor symptoms and general obstetric precautions including but not limited to vaginal bleeding, contractions, leaking of fluid and fetal movement were reviewed in detail with the patient. ? ?Please refer to After Visit Summary for other counseling recommendations.  ? ?Return in about 1 week (around 01/16/2022) [facility] ? ?LaLynnda ShieldsMD ?Faculty Attending ?[facility]  ?

## 2022-01-10 ENCOUNTER — Ambulatory Visit: Payer: Medicaid Other | Admitting: *Deleted

## 2022-01-10 ENCOUNTER — Ambulatory Visit: Payer: Medicaid Other | Attending: Obstetrics and Gynecology | Admitting: *Deleted

## 2022-01-10 ENCOUNTER — Encounter: Payer: Self-pay | Admitting: *Deleted

## 2022-01-10 VITALS — BP 137/72 | HR 87

## 2022-01-10 DIAGNOSIS — D259 Leiomyoma of uterus, unspecified: Secondary | ICD-10-CM

## 2022-01-10 DIAGNOSIS — Z3A37 37 weeks gestation of pregnancy: Secondary | ICD-10-CM | POA: Insufficient documentation

## 2022-01-10 DIAGNOSIS — O09523 Supervision of elderly multigravida, third trimester: Secondary | ICD-10-CM

## 2022-01-10 DIAGNOSIS — O0990 Supervision of high risk pregnancy, unspecified, unspecified trimester: Secondary | ICD-10-CM

## 2022-01-10 DIAGNOSIS — O09513 Supervision of elderly primigravida, third trimester: Secondary | ICD-10-CM | POA: Diagnosis not present

## 2022-01-10 DIAGNOSIS — O3413 Maternal care for benign tumor of corpus uteri, third trimester: Secondary | ICD-10-CM | POA: Insufficient documentation

## 2022-01-10 NOTE — Procedures (Signed)
Analisse Galgano ?1981/08/18 ?[redacted]w[redacted]d? ?Fetus A Non-Stress Test Interpretation for 01/10/22 ? ?Indication: Advanced Maternal Age >40 years ? ?Fetal Heart Rate A ?Mode: External ?Baseline Rate (A): 150 bpm ?Variability: Moderate ?Accelerations: 15 x 15 ?Decelerations: None ?Multiple birth?: No ? ?Uterine Activity ?Mode: Toco ?Contraction Frequency (min): 1 u/c in 20 min ?Contraction Duration (sec): 90 ?Contraction Quality: Mild ?Resting Tone Palpated: Relaxed ? ?Interpretation (Fetal Testing) ?Nonstress Test Interpretation: Reactive ?Overall Impression: Reassuring for gestational age ?Comments: tracing reviewed by Dr. FAnnamaria Boots? ? ?

## 2022-01-12 ENCOUNTER — Ambulatory Visit (INDEPENDENT_AMBULATORY_CARE_PROVIDER_SITE_OTHER): Payer: Medicaid Other | Admitting: Student in an Organized Health Care Education/Training Program

## 2022-01-12 ENCOUNTER — Encounter (HOSPITAL_COMMUNITY): Payer: Self-pay | Admitting: Student in an Organized Health Care Education/Training Program

## 2022-01-12 VITALS — BP 130/76 | HR 63 | Ht 64.5 in | Wt 178.0 lb

## 2022-01-12 DIAGNOSIS — F419 Anxiety disorder, unspecified: Secondary | ICD-10-CM | POA: Diagnosis not present

## 2022-01-12 DIAGNOSIS — F4310 Post-traumatic stress disorder, unspecified: Secondary | ICD-10-CM | POA: Diagnosis not present

## 2022-01-12 DIAGNOSIS — F331 Major depressive disorder, recurrent, moderate: Secondary | ICD-10-CM

## 2022-01-12 MED ORDER — SERTRALINE HCL 50 MG PO TABS: 50.0000 mg | ORAL_TABLET | Freq: Every day | ORAL | 0 refills | Status: DC

## 2022-01-12 MED ORDER — OLANZAPINE 5 MG PO TABS: 5.0000 mg | ORAL_TABLET | Freq: Every day | ORAL | 0 refills | Status: DC

## 2022-01-12 MED ORDER — HYDROXYZINE HCL 25 MG PO TABS: 25.0000 mg | ORAL_TABLET | Freq: Four times a day (QID) | ORAL | 0 refills | Status: DC

## 2022-01-12 NOTE — Progress Notes (Signed)
BH MD/PA/NP OP Progress Note ? ?01/12/2022 3:48 PM ?Holly Vazquez  ?MRN:  [id number] ? ?Chief Complaint:  ?Chief Complaint  ?Patient presents with  ? Medication Management  ? ?HPI:  ?Holly Vazquez is a 41 yr old female [redacted]W[redacted]D [phone] who presents for continued medication management for her depression and anxiety.  PPHx is significant for MDD, Recurrent, Moderate, PMDD, Adjustment Disorder, Anxiety, PTSD.  No hospitalizations or suicide attempts. ? ?She reports that she has been doing ok recently.  She does report that she has had to put her mother into a nursing home and that it is hard watching her be so sick.  She also reports she is becoming a little anxious about delivery.  She reports that her mood despite these things has been more stable and that she is feeling more like herself.  She reports anticipating getting back to work.  She reports no issues with her medications. ? ?Discussed with her that babies born to mothers taking Zoloft can sometimes have withdrawal where they are jittery and cry.  Discussed that to reduce the risk of this we would reduce her Zoloft.  She was agreeable to this.   ? ?Discussed that we would make a follow up appointment for 2 weeks, however, if she has her child to call and reschedule the appointment for 4 weeks after delivery.  Discussed with her what to do in the event she begins to have thoughts of hurting herself or anyone else specifically her children.  Discussed that she must immediately return to the [facility], go to [facility], go to the nearest ED, or call [phone] or [phone].   She reported understanding and had no concerns.  She reports no SI, HI, or AVH.  She reports her sleep has been good.  She reports her appetite is good.  She reports no other concerns at present. ? ? ? ?Visit Diagnosis:  ?  ICD-10-CM   ?1. MDD (major depressive disorder), recurrent episode, moderate (HCC)  F33.1   ?  ?2. PTSD (post-traumatic stress disorder)  F43.10   ?  ?3. Anxiety  F41.9   ?  ? ? ?Past  Psychiatric History: MDD, Recurrent, Moderate, PMDD, Adjustment Disorder, Anxiety, PTSD.  No hospitalizations or suicide attempt. ? ?Past Medical History:  ?Past Medical History:  ?Diagnosis Date  ? Absence of menstruation   ? Allergic rhinitis 07/26/2015  ? 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 Anemia   ? due to heavy menses, on iron  ? Anxiety   ? Cervical polyp 03/20/2018  ? Removed 03-20-18  ? Contact dermatitis and other eczema, due to unspecified cause   ? Depression   ? Fibroids   ? Heart murmur   ? States always told it was from anemia  ? Restless legs syndrome (RLS)   ? Urinary tract infection, site not specified   ?  ?Past Surgical History:  ?Procedure Laterality Date  ? WISDOM TOOTH EXTRACTION    ? ? ?Family Psychiatric History: Father - Depression, Anxiety, Polysubstance abuse (EtOH, Crack, PCP) ?Mother - Depression, Anxiety, Crack abuse ?Maternal Grandmother - Anxiety ?Adopted Cousin - Suicide at age 24 ? ?Family History:  ?Family History  ?Problem Relation Age of Onset  ? Hypertension Father   ? Heart disease Maternal Grandmother   ? Stroke Maternal Grandmother   ? Hypertension Maternal Grandmother   ? Eczema Son   ? Eczema Son   ? Eczema Son   ? Lung cancer Mother   ? ? ?Social History:  ?Social History  ? ?  Socioeconomic History  ? Marital status: Married  ?  Spouse name: Not on file  ? Number of children: Not on file  ? Years of education: Not on file  ? Highest education level: Not on file  ?Occupational History  ? Not on file  ?Tobacco Use  ? Smoking status: Never  ? Smokeless tobacco: Never  ?Vaping Use  ? Vaping Use: Never used  ?Substance and Sexual Activity  ? Alcohol use: No  ? Drug use: Yes  ?  Types: Marijuana  ? Sexual activity: Yes  ?  Birth control/protection: None  ?  Comment: Husband - vasectomy  ?Other Topics Concern  ? Not on file  ?Social History Narrative  ? Not on file  ? ?Social Determinants of Health  ? ?Financial Resource Strain: Not on file  ?Food Insecurity: Food Insecurity Present  ?  Worried About Charity fundraiser in the Last Year: Sometimes true  ? Ran Out of Food in the Last Year: Sometimes true  ?Transportation Needs: No Transportation Needs  ? Lack of Transportation (Medical): No  ? Lack of Transportation (Non-Medical): No  ?Physical Activity: Not on file  ?Stress: Not on file  ?Social Connections: Not on file  ? ? ?Allergies:  ?Allergies  ?Allergen Reactions  ? Flagyl [Metronidazole] Hives  ? Latex Hives  ? Nickel Dermatitis  ?  trigger  ? Penicillins Rash  ? ? ?Metabolic Disorder Labs: ?Lab Results  ?Component Value Date  ? HGBA1C 5.7 (H) 07/11/2021  ? ?No results found for: PROLACTIN ?No results found for: CHOL, TRIG, HDL, CHOLHDL, VLDL, LDLCALC ?Lab Results  ?Component Value Date  ? TSH 0.803 07/11/2021  ? TSH 1.000 09/27/2017  ? ? ?Therapeutic Level Labs: ?No results found for: LITHIUM ?No results found for: VALPROATE ?No components found for:  CBMZ ? ?Current Medications: ?Current Outpatient Medications  ?Medication Sig Dispense Refill  ? aspirin EC 81 MG tablet Take 1 tablet (81 mg total) by mouth daily. Take after 12 weeks for prevention of preeclampsia later in pregnancy 300 tablet 2  ? Blood Pressure Monitoring DEVI 1 each by Does not apply route once a week. 1 each 0  ? famotidine (PEPCID) 20 MG tablet Take 1 tablet (20 mg total) by mouth 2 (two) times daily. 60 tablet 1  ? ferrous sulfate 325 (65 FE) MG tablet Take 1 tablet (325 mg total) by mouth every other day. 30 tablet 5  ? hydrOXYzine (ATARAX) 25 MG tablet Take 1 tablet (25 mg total) by mouth 4 (four) times daily. Take 1 tablet (25 mg) at morning and noon. Take 2 tablets (50 mg) at Night 120 tablet 0  ? OLANZapine (ZYPREXA) 5 MG tablet Take 1 tablet (5 mg total) by mouth at bedtime. 30 tablet 0  ? ondansetron (ZOFRAN ODT) 4 MG disintegrating tablet Take 1 tablet (4 mg total) by mouth every 6 (six) hours as needed for nausea. 20 tablet 0  ? Prenatal Vit-Fe Fumarate-FA (PREPLUS) 27-1 MG TABS Take 1 tablet by mouth daily.  30 tablet 13  ? promethazine (PHENERGAN) 25 MG tablet Take 1 tablet (25 mg total) by mouth every 6 (six) hours as needed for nausea or vomiting. 30 tablet 0  ? sertraline (ZOLOFT) 100 MG tablet Take 1 tablet (100 mg total) by mouth daily. 30 tablet 0  ? terconazole (TERAZOL 7) 0.4 % vaginal cream Place 1 applicator vaginally at bedtime. 45 g 0  ? ?No current facility-administered medications for this visit.  ? ? ? ?  Musculoskeletal: ?Strength & Muscle Tone: within normal limits ?Gait & Station: normal ?Patient leans: N/A ? ?Psychiatric Specialty Exam: ?Review of Systems  ?Psychiatric/Behavioral:  Negative for dysphoric mood, hallucinations, sleep disturbance and suicidal ideas. The patient is nervous/anxious (mild, about pending birth of her child).    ?Blood pressure 130/76, pulse 63, height 5' 4.5" (1.638 m), weight 178 lb (80.7 kg), last menstrual period 04/22/2021, SpO2 99 %.Body mass index is 30.08 kg/m?.  ?General Appearance: Casual and Fairly Groomed  ?Eye Contact:  Good  ?Speech:  Clear and Coherent and Normal Rate  ?Volume:  Normal  ?Mood:  Euthymic  ?Affect:  Appropriate and Congruent  ?Thought Process:  Coherent and Goal Directed  ?Orientation:  Full (Time, Place, and Person)  ?Thought Content: Logical   ?Suicidal Thoughts:  No  ?Homicidal Thoughts:  No  ?Memory:  Immediate;   Good ?Recent;   Good  ?Judgement:  Good  ?Insight:  Good  ?Psychomotor Activity:  Normal  ?Concentration:  Concentration: Good and Attention Span: Good  ?Recall:  Good  ?Fund of Knowledge: Good  ?Language: Good  ?Akathisia:  Negative  ?Handed:  Right  ?AIMS (if indicated): done  AIMS= 0   ?Assets:  Communication Skills ?Desire for Improvement ?Financial Resources/Insurance ?Housing ?Physical Health ?Resilience ?Social Support  ?ADL's:  Intact  ?Cognition: WNL  ?Sleep:  Fair  ? ?Screenings: ?GAD-7   ? ?Flowsheet Row Routine Prenatal from 01/09/2022 in [facility] for [place] at [facility] Routine Prenatal  from 12/30/2021 in [facility] [place] at [facility] Routine Prenatal from 12/14/2021 in [place] for [place] [facility] Routine Prenatal from 11/17/2021

## 2022-01-16 ENCOUNTER — Ambulatory Visit (INDEPENDENT_AMBULATORY_CARE_PROVIDER_SITE_OTHER): Payer: Medicaid Other | Admitting: Obstetrics and Gynecology

## 2022-01-16 VITALS — BP 132/84 | HR 106 | Wt 179.1 lb

## 2022-01-16 DIAGNOSIS — O3410 Maternal care for benign tumor of corpus uteri, unspecified trimester: Secondary | ICD-10-CM

## 2022-01-16 DIAGNOSIS — F4323 Adjustment disorder with mixed anxiety and depressed mood: Secondary | ICD-10-CM

## 2022-01-16 DIAGNOSIS — O0990 Supervision of high risk pregnancy, unspecified, unspecified trimester: Secondary | ICD-10-CM

## 2022-01-16 DIAGNOSIS — Z3009 Encounter for other general counseling and advice on contraception: Secondary | ICD-10-CM

## 2022-01-16 DIAGNOSIS — D259 Leiomyoma of uterus, unspecified: Secondary | ICD-10-CM

## 2022-01-16 DIAGNOSIS — Z3A38 38 weeks gestation of pregnancy: Secondary | ICD-10-CM

## 2022-01-16 DIAGNOSIS — O09523 Supervision of elderly multigravida, third trimester: Secondary | ICD-10-CM

## 2022-01-16 DIAGNOSIS — O403XX0 Polyhydramnios, third trimester, not applicable or unspecified: Secondary | ICD-10-CM | POA: Insufficient documentation

## 2022-01-16 NOTE — Patient Instructions (Signed)
Saturday night, April 8 at 1145 PM ?

## 2022-01-16 NOTE — Progress Notes (Signed)
? ? ?  PRENATAL VISIT NOTE ? ?Subjective:  ?Holly Vazquez is a 41 y.o. G6P5006 at 55w3dbeing seen today for ongoing prenatal care.  She is currently monitored for the following issues for this high-risk pregnancy and has Anemia; PMDD (premenstrual dysphoric disorder); Anxiety; Supervision of high risk pregnancy, antepartum; AMA (advanced maternal age) multigravida 326+ Alpha thalassemia silent carrier; Tetrahydrocannabinol (THC) use disorder, mild, abuse; Adjustment disorder with mixed anxiety and depressed mood; Cannabis use disorder; Food insecurity; PTSD (post-traumatic stress disorder); MDD (major depressive disorder), recurrent episode, moderate ([place]; [place]ip[place]; Uterine fibroid complicating antenatal care, baby not yet delivered; Supervision of high risk pregnancy due to social problems, third trimester; and Unwanted fertility on their problem list. ? ?Patient reports no complaints.  Contractions: Irritability. Vag. Bleeding: None.  Movement: Present. Denies leaking of fluid.  ? ?The following portions of the patient's history were reviewed and updated as appropriate: allergies, current medications, past family history, past medical history, past social history, past surgical history and problem list.  ? ?Objective:  ? ?Vitals:  ? 01/16/22 1554  ?BP: 132/84  ?Pulse: (!) 106  ?Weight: 179 lb 1.6 oz (81.2 kg)  ? ? ?Fetal Status: Fetal Heart Rate (bpm): 144   Movement: Present    ? ?General:  Alert, oriented and cooperative. Patient is in no acute distress.  ?Skin: Skin is warm and dry. No rash noted.   ?Cardiovascular: Normal heart rate noted  ?Respiratory: Normal respiratory effort, no problems with respiration noted  ?Abdomen: Soft, gravid, appropriate for gestational age.  Pain/Pressure: Present     ?Pelvic: Cervical exam deferred        ?Extremities: Normal range of motion.  Edema: Trace  ?Mental Status: Normal mood and affect. Normal behavior. Normal judgment and thought content.  ? ?Assessment and  Plan:  ?Pregnancy: G6P5006 at 326w3d1. [redacted] weeks gestation of pregnancy ?3/1/76cephalic, bpp 8/8, afi 2316.0ith mvp 9.3>>has repeat on 4/8 ?3/17: 55%, 2862gm, ac 88%, bpp 8/8, afi 22 with normal mvp ?GBS neg ? ?2. Uterine fibroid complicating antenatal care, baby not yet delivered ?Last described on 12/16 and was 3cm for largest one.  ? ?3. Unwanted fertility ?BTL papers UTD ? ?4. Supervision of high risk pregnancy, antepartum ? ?5. Multigravida of advanced maternal age in third trimester ?No issues ? ?6. Adjustment disorder with mixed anxiety and depressed mood ?Continue zyprexa, PRN atarax.  ? ?7. Polyhydramnios ?Pt requesting IOL. I told her this is reasonable and she was set up for 4/8 at 2345 ?2h GTT wnl ?Term labor symptoms and general obstetric precautions including but not limited to vaginal bleeding, contractions, leaking of fluid and fetal movement were reviewed in detail with the patient. ?Please refer to After Visit Summary for other counseling recommendations.  ? ?Return if symptoms worsen or fail to improve. ? ?Future Appointments  ?Date Time Provider [place]?01/17/2022  7:45 AM [facility] NURSE [facility]  ?01/17/2022  8:00 AM [facility] US7 [facility]  ?01/21/2022 12:00 AM MC-LD SCHED ROOM [facility]INDC None  ?01/25/2022 10:00 AM HoMoses Manners, LCSWA [facility] None  ?02/02/2022  4:00 PM Pashayan, AlRedgie GrayerMD [facility] None  ? ? ?ChAletha HalimMD ? ?

## 2022-01-17 ENCOUNTER — Ambulatory Visit: Payer: Medicaid Other | Admitting: *Deleted

## 2022-01-17 ENCOUNTER — Telehealth (HOSPITAL_COMMUNITY): Payer: Self-pay | Admitting: *Deleted

## 2022-01-17 ENCOUNTER — Ambulatory Visit: Payer: Medicaid Other | Attending: Obstetrics and Gynecology

## 2022-01-17 ENCOUNTER — Encounter: Payer: Self-pay | Admitting: *Deleted

## 2022-01-17 VITALS — BP 131/82 | HR 86

## 2022-01-17 DIAGNOSIS — Z148 Genetic carrier of other disease: Secondary | ICD-10-CM | POA: Diagnosis not present

## 2022-01-17 DIAGNOSIS — O403XX0 Polyhydramnios, third trimester, not applicable or unspecified: Secondary | ICD-10-CM | POA: Diagnosis not present

## 2022-01-17 DIAGNOSIS — O285 Abnormal chromosomal and genetic finding on antenatal screening of mother: Secondary | ICD-10-CM

## 2022-01-17 DIAGNOSIS — Z3A38 38 weeks gestation of pregnancy: Secondary | ICD-10-CM | POA: Diagnosis not present

## 2022-01-17 DIAGNOSIS — D563 Thalassemia minor: Secondary | ICD-10-CM

## 2022-01-17 DIAGNOSIS — D259 Leiomyoma of uterus, unspecified: Secondary | ICD-10-CM | POA: Insufficient documentation

## 2022-01-17 DIAGNOSIS — O0943 Supervision of pregnancy with grand multiparity, third trimester: Secondary | ICD-10-CM

## 2022-01-17 DIAGNOSIS — O09523 Supervision of elderly multigravida, third trimester: Secondary | ICD-10-CM

## 2022-01-17 DIAGNOSIS — O3413 Maternal care for benign tumor of corpus uteri, third trimester: Secondary | ICD-10-CM | POA: Insufficient documentation

## 2022-01-17 DIAGNOSIS — O0990 Supervision of high risk pregnancy, unspecified, unspecified trimester: Secondary | ICD-10-CM | POA: Diagnosis present

## 2022-01-17 NOTE — Telephone Encounter (Signed)
Preadmission screen  

## 2022-01-18 ENCOUNTER — Other Ambulatory Visit: Payer: Self-pay | Admitting: Advanced Practice Midwife

## 2022-01-18 ENCOUNTER — Telehealth (HOSPITAL_COMMUNITY): Payer: Self-pay | Admitting: *Deleted

## 2022-01-18 ENCOUNTER — Encounter (HOSPITAL_COMMUNITY): Payer: Self-pay | Admitting: *Deleted

## 2022-01-18 NOTE — Telephone Encounter (Signed)
Preadmission screen  

## 2022-01-21 ENCOUNTER — Encounter (HOSPITAL_COMMUNITY): Payer: Self-pay | Admitting: Obstetrics and Gynecology

## 2022-01-21 ENCOUNTER — Inpatient Hospital Stay (HOSPITAL_COMMUNITY): Payer: Medicaid Other

## 2022-01-21 ENCOUNTER — Inpatient Hospital Stay (HOSPITAL_COMMUNITY): Payer: Medicaid Other | Admitting: Anesthesiology

## 2022-01-21 ENCOUNTER — Other Ambulatory Visit: Payer: Self-pay

## 2022-01-21 ENCOUNTER — Inpatient Hospital Stay (HOSPITAL_COMMUNITY)
Admission: AD | Admit: 2022-01-21 | Discharge: 2022-01-23 | DRG: 806 | Disposition: A | Discharge status: Home / Self Care | Payer: Medicaid Other | Attending: Obstetrics and Gynecology | Admitting: Obstetrics and Gynecology

## 2022-01-21 DIAGNOSIS — B3731 Acute candidiasis of vulva and vagina: Secondary | ICD-10-CM | POA: Diagnosis not present

## 2022-01-21 DIAGNOSIS — O99344 Other mental disorders complicating childbirth: Secondary | ICD-10-CM | POA: Diagnosis present

## 2022-01-21 DIAGNOSIS — O9882 Other maternal infectious and parasitic diseases complicating childbirth: Secondary | ICD-10-CM | POA: Diagnosis present

## 2022-01-21 DIAGNOSIS — D259 Leiomyoma of uterus, unspecified: Secondary | ICD-10-CM | POA: Diagnosis present

## 2022-01-21 DIAGNOSIS — O3413 Maternal care for benign tumor of corpus uteri, third trimester: Secondary | ICD-10-CM | POA: Diagnosis not present

## 2022-01-21 DIAGNOSIS — F4310 Post-traumatic stress disorder, unspecified: Secondary | ICD-10-CM | POA: Diagnosis present

## 2022-01-21 DIAGNOSIS — O09529 Supervision of elderly multigravida, unspecified trimester: Secondary | ICD-10-CM

## 2022-01-21 DIAGNOSIS — Z3A39 39 weeks gestation of pregnancy: Secondary | ICD-10-CM

## 2022-01-21 DIAGNOSIS — F331 Major depressive disorder, recurrent, moderate: Secondary | ICD-10-CM | POA: Diagnosis not present

## 2022-01-21 DIAGNOSIS — O0990 Supervision of high risk pregnancy, unspecified, unspecified trimester: Secondary | ICD-10-CM

## 2022-01-21 DIAGNOSIS — D563 Thalassemia minor: Secondary | ICD-10-CM | POA: Diagnosis present

## 2022-01-21 DIAGNOSIS — O403XX0 Polyhydramnios, third trimester, not applicable or unspecified: Secondary | ICD-10-CM | POA: Diagnosis not present

## 2022-01-21 LAB — CBC
HCT: 32.1 % — ABNORMAL LOW (ref 36.0–46.0)
Hemoglobin: 9.9 g/dL — ABNORMAL LOW (ref 12.0–15.0)
MCH: 23.1 pg — ABNORMAL LOW (ref 26.0–34.0)
MCHC: 30.8 g/dL (ref 30.0–36.0)
MCV: 75 fL — ABNORMAL LOW (ref 80.0–100.0)
Platelets: 224 10*3/uL (ref 150–400)
RBC: 4.28 MIL/uL (ref 3.87–5.11)
RDW: 16.8 % — ABNORMAL HIGH (ref 11.5–15.5)
WBC: 10.2 10*3/uL (ref 4.0–10.5)
nRBC: 0 % (ref 0.0–0.2)

## 2022-01-21 LAB — RPR: RPR Ser Ql: NONREACTIVE

## 2022-01-21 LAB — WET PREP, GENITAL
Clue Cells Wet Prep HPF POC: NONE SEEN
Sperm: NONE SEEN
Trich, Wet Prep: NONE SEEN
WBC, Wet Prep HPF POC: >=10 — AB (ref <10)

## 2022-01-21 LAB — TYPE AND SCREEN
ABO/RH(D): B POS
Antibody Screen: NEGATIVE

## 2022-01-21 MED ORDER — OXYTOCIN BOLUS FROM INFUSION
333.0000 mL | Freq: Once | INTRAVENOUS | Status: AC
Start: 2022-01-21 — End: 2022-01-21
  Administered 2022-01-21: 333 mL via INTRAVENOUS

## 2022-01-21 MED ORDER — FAMOTIDINE IN NACL 20-0.9 MG/50ML-% IV SOLN
20.0000 mg | Freq: Two times a day (BID) | INTRAVENOUS | Status: DC
  Administered 2022-01-21 (×3): 20 mg via INTRAVENOUS
  Filled 2022-01-21 (×4): qty 50

## 2022-01-21 MED ORDER — VITAMINS A & D EX OINT
TOPICAL_OINTMENT | CUTANEOUS | Status: DC | PRN
  Administered 2022-01-21 – 2022-01-22 (×2): 113 via TOPICAL
  Filled 2022-01-21: qty 56.7

## 2022-01-21 MED ORDER — OLANZAPINE 5 MG PO TABS
5.0000 mg | ORAL_TABLET | Freq: Every day | ORAL | Status: DC
  Administered 2022-01-22: 5 mg via ORAL
  Filled 2022-01-21 (×3): qty 1

## 2022-01-21 MED ORDER — COCONUT OIL OIL
1.0000 "application " | TOPICAL_OIL | Status: DC | PRN
  Administered 2022-01-23: 1 via TOPICAL

## 2022-01-21 MED ORDER — LIDOCAINE HCL URETHRAL/MUCOSAL 2 % EX GEL
1.0000 "application " | Freq: Once | CUTANEOUS | Status: AC
  Administered 2022-01-21: 1 via TOPICAL
  Filled 2022-01-21: qty 6

## 2022-01-21 MED ORDER — ACETAMINOPHEN 325 MG PO TABS
650.0000 mg | ORAL_TABLET | ORAL | Status: DC | PRN
  Administered 2022-01-22 – 2022-01-23 (×3): 650 mg via ORAL
  Filled 2022-01-21 (×3): qty 2

## 2022-01-21 MED ORDER — ACETAMINOPHEN 325 MG PO TABS: 650.0000 mg | ORAL_TABLET | ORAL | Status: DC | PRN

## 2022-01-21 MED ORDER — LACTATED RINGERS IV SOLN
500.0000 mL | Freq: Once | INTRAVENOUS | Status: AC
  Administered 2022-01-21: 500 mL via INTRAVENOUS

## 2022-01-21 MED ORDER — TRANEXAMIC ACID-NACL 1000-0.7 MG/100ML-% IV SOLN: 1000.0000 mg | INTRAVENOUS | Status: AC

## 2022-01-21 MED ORDER — BENZOCAINE-MENTHOL 20-0.5 % EX AERO
20.0000 "application " | INHALATION_SPRAY | CUTANEOUS | Status: DC | PRN
  Filled 2022-01-21: qty 56

## 2022-01-21 MED ORDER — OXYTOCIN-SODIUM CHLORIDE 30-0.9 UT/500ML-% IV SOLN
2.5000 [IU]/h | INTRAVENOUS | Status: DC
  Filled 2022-01-21: qty 500

## 2022-01-21 MED ORDER — PRENATAL MULTIVITAMIN CH
1.0000 | ORAL_TABLET | Freq: Every day | ORAL | Status: DC
  Administered 2022-01-22 – 2022-01-23 (×2): 1 via ORAL
  Filled 2022-01-21 (×2): qty 1

## 2022-01-21 MED ORDER — LIDOCAINE HCL (PF) 1 % IJ SOLN
INTRAMUSCULAR | Status: DC | PRN
  Administered 2022-01-21: 8 mL via EPIDURAL

## 2022-01-21 MED ORDER — TRANEXAMIC ACID-NACL 1000-0.7 MG/100ML-% IV SOLN
INTRAVENOUS | Status: AC
  Administered 2022-01-21: 1000 mg via INTRAVENOUS
  Filled 2022-01-21: qty 100

## 2022-01-21 MED ORDER — HYDROXYZINE HCL 25 MG PO TABS
25.0000 mg | ORAL_TABLET | Freq: Three times a day (TID) | ORAL | Status: DC
  Administered 2022-01-21 – 2022-01-23 (×7): 25 mg via ORAL
  Filled 2022-01-21 (×7): qty 1

## 2022-01-21 MED ORDER — TETANUS-DIPHTH-ACELL PERTUSSIS 5-2.5-18.5 LF-MCG/0.5 IM SUSY: 0.5000 mL | PREFILLED_SYRINGE | Freq: Once | INTRAMUSCULAR | Status: DC

## 2022-01-21 MED ORDER — ONDANSETRON HCL 4 MG PO TABS: 4.0000 mg | ORAL_TABLET | ORAL | Status: DC | PRN

## 2022-01-21 MED ORDER — EPHEDRINE 5 MG/ML INJ: 10.0000 mg | INTRAVENOUS | Status: DC | PRN

## 2022-01-21 MED ORDER — PHENYLEPHRINE 40 MCG/ML (10ML) SYRINGE FOR IV PUSH (FOR BLOOD PRESSURE SUPPORT): 80.0000 ug | PREFILLED_SYRINGE | INTRAVENOUS | Status: DC | PRN

## 2022-01-21 MED ORDER — ONDANSETRON HCL 4 MG/2ML IJ SOLN: 4.0000 mg | Freq: Four times a day (QID) | INTRAMUSCULAR | Status: DC | PRN

## 2022-01-21 MED ORDER — WITCH HAZEL-GLYCERIN EX PADS: 1.0000 "application " | MEDICATED_PAD | CUTANEOUS | Status: DC | PRN

## 2022-01-21 MED ORDER — DIPHENHYDRAMINE HCL 50 MG/ML IJ SOLN: 12.5000 mg | INTRAMUSCULAR | Status: DC | PRN

## 2022-01-21 MED ORDER — BENZOCAINE-MENTHOL 20-0.5 % EX AERO: 1.0000 "application " | INHALATION_SPRAY | CUTANEOUS | Status: DC | PRN

## 2022-01-21 MED ORDER — LACTATED RINGERS IV SOLN: 500.0000 mL | INTRAVENOUS | Status: DC | PRN

## 2022-01-21 MED ORDER — FENTANYL-BUPIVACAINE-NACL 0.5-0.125-0.9 MG/250ML-% EP SOLN: 12.0000 mL/h | EPIDURAL | Status: DC | PRN

## 2022-01-21 MED ORDER — ZOLPIDEM TARTRATE 5 MG PO TABS: 5.0000 mg | ORAL_TABLET | Freq: Every evening | ORAL | Status: DC | PRN

## 2022-01-21 MED ORDER — ONDANSETRON HCL 4 MG/2ML IJ SOLN: 4.0000 mg | INTRAMUSCULAR | Status: DC | PRN

## 2022-01-21 MED ORDER — MISOPROSTOL 200 MCG PO TABS
ORAL_TABLET | ORAL | Status: AC
  Filled 2022-01-21: qty 5

## 2022-01-21 MED ORDER — FENTANYL-BUPIVACAINE-NACL 0.5-0.125-0.9 MG/250ML-% EP SOLN
12.0000 mL/h | EPIDURAL | Status: DC | PRN
  Administered 2022-01-21: 12 mL/h via EPIDURAL
  Filled 2022-01-21: qty 250

## 2022-01-21 MED ORDER — LACTATED RINGERS IV SOLN
INTRAVENOUS | Status: DC
Start: 2022-01-21 — End: 2022-01-21

## 2022-01-21 MED ORDER — MISOPROSTOL 200 MCG PO TABS: 1000.0000 ug | ORAL_TABLET | Freq: Once | ORAL | Status: DC

## 2022-01-21 MED ORDER — SERTRALINE HCL 50 MG PO TABS
50.0000 mg | ORAL_TABLET | Freq: Every day | ORAL | Status: DC
  Administered 2022-01-21 – 2022-01-23 (×3): 50 mg via ORAL
  Filled 2022-01-21 (×3): qty 1

## 2022-01-21 MED ORDER — SENNOSIDES-DOCUSATE SODIUM 8.6-50 MG PO TABS
2.0000 | ORAL_TABLET | Freq: Every day | ORAL | Status: DC
  Administered 2022-01-22 – 2022-01-23 (×2): 2 via ORAL
  Filled 2022-01-21 (×2): qty 2

## 2022-01-21 MED ORDER — TERBUTALINE SULFATE 1 MG/ML IJ SOLN: 0.2500 mg | Freq: Once | INTRAMUSCULAR | Status: DC | PRN

## 2022-01-21 MED ORDER — DIPHENHYDRAMINE HCL 25 MG PO CAPS: 25.0000 mg | ORAL_CAPSULE | Freq: Four times a day (QID) | ORAL | Status: DC | PRN

## 2022-01-21 MED ORDER — DIBUCAINE (PERIANAL) 1 % EX OINT
1.0000 | TOPICAL_OINTMENT | CUTANEOUS | Status: DC | PRN
Start: 2022-01-21 — End: 2022-01-23

## 2022-01-21 MED ORDER — SIMETHICONE 80 MG PO CHEW: 80.0000 mg | CHEWABLE_TABLET | ORAL | Status: DC | PRN

## 2022-01-21 MED ORDER — LIDOCAINE HCL (PF) 1 % IJ SOLN: 30.0000 mL | INTRAMUSCULAR | Status: DC | PRN

## 2022-01-21 MED ORDER — MISOPROSTOL 50MCG HALF TABLET
50.0000 ug | ORAL_TABLET | ORAL | Status: DC | PRN
  Administered 2022-01-21: 50 ug via BUCCAL
  Filled 2022-01-21: qty 1

## 2022-01-21 MED ORDER — SOD CITRATE-CITRIC ACID 500-334 MG/5ML PO SOLN: 30.0000 mL | ORAL | Status: DC | PRN

## 2022-01-21 MED ORDER — MISOPROSTOL 25 MCG QUARTER TABLET: 25.0000 ug | ORAL_TABLET | ORAL | Status: DC | PRN

## 2022-01-21 MED ORDER — OXYTOCIN-SODIUM CHLORIDE 30-0.9 UT/500ML-% IV SOLN
1.0000 m[IU]/min | INTRAVENOUS | Status: DC
  Administered 2022-01-21: 2 m[IU]/min via INTRAVENOUS

## 2022-01-21 MED ORDER — FLUCONAZOLE 150 MG PO TABS
150.0000 mg | ORAL_TABLET | Freq: Once | ORAL | Status: AC
  Administered 2022-01-21: 150 mg via ORAL
  Filled 2022-01-21: qty 1

## 2022-01-21 MED ORDER — IBUPROFEN 600 MG PO TABS
600.0000 mg | ORAL_TABLET | Freq: Four times a day (QID) | ORAL | Status: DC
  Administered 2022-01-22 – 2022-01-23 (×7): 600 mg via ORAL
  Filled 2022-01-21 (×7): qty 1

## 2022-01-21 MED ORDER — FENTANYL CITRATE (PF) 100 MCG/2ML IJ SOLN
50.0000 ug | INTRAMUSCULAR | Status: AC | PRN
  Administered 2022-01-21 (×2): 50 ug via INTRAVENOUS
  Filled 2022-01-21 (×2): qty 2

## 2022-01-21 NOTE — Anesthesia Preprocedure Evaluation (Signed)
Anesthesia Evaluation  ?Patient identified by MRN, date of birth, ID band ?Patient awake ? ? ? ?Reviewed: ?Allergy & Precautions, H&P , NPO status , Patient's Chart, lab work & pertinent test results, reviewed documented beta blocker date and time  ? ?Airway ?Mallampati: II ? ?TM Distance: >3 FB ?Neck ROM: full ? ? ? Dental ?no notable dental hx. ? ?  ?Pulmonary ?neg pulmonary ROS,  ?  ?Pulmonary exam normal ?breath sounds clear to auscultation ? ? ? ? ? ? Cardiovascular ?negative cardio ROS ?Normal cardiovascular exam ?Rhythm:regular Rate:Normal ? ? ?  ?Neuro/Psych ?negative neurological ROS ? negative psych ROS  ? GI/Hepatic ?negative GI ROS, Neg liver ROS,   ?Endo/Other  ?negative endocrine ROS ? Renal/GU ?negative Renal ROS  ?negative genitourinary ?  ?Musculoskeletal ? ? Abdominal ?  ?Peds ? Hematology ?negative hematology ROS ?(+)   ?Anesthesia Other Findings ? ? Reproductive/Obstetrics ?(+) Pregnancy ? ?  ? ? ? ? ? ? ? ? ? ? ? ? ? ?  ?  ? ? ? ? ? ? ? ? ?Anesthesia Physical ?Anesthesia Plan ? ?ASA: 2 ? ?Anesthesia Plan: Epidural  ? ?Post-op Pain Management: Minimal or no pain anticipated  ? ?Induction:  ? ?PONV Risk Score and Plan: 2 and Treatment may vary due to age or medical condition ? ?Airway Management Planned: Natural Airway ? ?Additional Equipment: None ? ?Intra-op Plan:  ? ?Post-operative Plan:  ? ?Informed Consent: I have reviewed the patients History and Physical, chart, labs and discussed the procedure including the risks, benefits and alternatives for the proposed anesthesia with the patient or authorized representative who has indicated his/her understanding and acceptance.  ? ? ? ?Dental Advisory Given ? ?Plan Discussed with: Anesthesiologist and CRNA ? ?Anesthesia Plan Comments: (Labs checked- platelets confirmed with RN in room. Fetal heart tracing, per RN, reported to be stable enough for sitting procedure. ?Discussed epidural, and patient consents to the  procedure:  included risk of possible headache,backache, failed block, allergic reaction, and nerve injury. This patient was asked if she had any questions or concerns before the procedure started.)  ? ? ? ? ? ? ?Anesthesia Quick Evaluation ? ?

## 2022-01-21 NOTE — H&P (Addendum)
OBSTETRIC ADMISSION HISTORY AND PHYSICAL ? ?Holly Vazquez is a 41 y.o. female G6P5006 with IUP at 24w1dby LMP presenting for eIOL and previous polyhydramnios (now resolved at 36w [place]. She reports +FMs, No LOF, no VB, no blurry vision, no headaches, no peripheral edema, and no RUQ pain.  She plans on breast feeding. She is considering BTL for birth control and has signed the consent, she is also considering hormonal options that will help with PMDD. She has questions regarding the healing after BTL ?[facility] ?Dating: By LMP --->  Estimated Date of Delivery: 01/27/22 ? ?Sono:   ? ?'@[redacted]w[redacted]d'$ , CWD, normal anatomy, transverse presentation, posterior placental lie, 2862g, 55% EFW ? ? ?Prenatal History/Complications:  ?Polyhydramnios (resolved '@36wk'$  scan) ?AMA ?Alpha thalassemia silent carrier ?PTSD ?MDD ?Anxiety ?Uterine Fibroid ?GPeotonemultiparity  ? ? ?Past Medical History: ?Past Medical History:  ?Diagnosis Date  ? Absence of menstruation   ? Allergic rhinitis 07/26/2015  ? 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 Anemia   ? due to heavy menses, on iron  ? Anxiety   ? Cervical polyp 03/20/2018  ? Removed 03-20-18  ? Contact dermatitis and other eczema, due to unspecified cause   ? Depression   ? Fibroids   ? Heart murmur   ? States always told it was from anemia  ? Restless legs syndrome (RLS)   ? Urinary tract infection, site not specified   ? ? ?Past Surgical History: ?Past Surgical History:  ?Procedure Laterality Date  ? WISDOM TOOTH EXTRACTION    ? ? ?Obstetrical History: ?OB History   ? ? Gravida  ?6  ? Para  ?5  ? Term  ?5  ? Preterm  ?0  ? AB  ?0  ? Living  ?6  ?  ? ? SAB  ?0  ? IAB  ?0  ? Ectopic  ?0  ? Multiple  ?1  ? Live Births  ?4  ?   ?  ?  ? ? ?Social History ?Social History  ? ?Socioeconomic History  ? Marital status: Married  ?  Spouse name: Not on file  ? Number of children: Not on file  ? Years of education: Not on file  ? Highest education level: Not on file  ?Occupational  History  ? Not on file  ?Tobacco Use  ? Smoking status: Never  ? Smokeless tobacco: Never  ?Vaping Use  ? Vaping Use: Never used  ?Substance and Sexual Activity  ? Alcohol use: No  ? Drug use: Yes  ?  Types: Marijuana  ? Sexual activity: Yes  ?  Birth control/protection: None  ?  Comment: Husband - vasectomy  ?Other Topics Concern  ? Not on file  ?Social History Narrative  ? Not on file  ? ?Social Determinants of Health  ? ?Financial Resource Strain: Not on file  ?Food Insecurity: Food Insecurity Present  ? Worried About RCharity fundraiserin the Last Year: Sometimes true  ? Ran Out of Food in the Last Year: Sometimes true  ?Transportation Needs: No Transportation Needs  ? Lack of Transportation (Medical): No  ? Lack of Transportation (Non-Medical): No  ?Physical Activity: Not on file  ?Stress: Not on file  ?Social Connections: Not on file  ? ? ?Family History: ?Family History  ?Problem Relation Age of Onset  ? Hypertension Father   ? Heart disease Maternal Grandmother   ? Stroke Maternal Grandmother   ? Hypertension Maternal Grandmother   ?  Eczema Son   ? Eczema Son   ? Eczema Son   ? Lung cancer Mother   ? ? ?Allergies: ?Allergies  ?Allergen Reactions  ? Flagyl [Metronidazole] Hives  ? Latex Hives  ? Nickel Dermatitis  ?  trigger  ? Penicillins Rash  ? ? ?Medications Prior to Admission  ?Medication Sig Dispense Refill Last Dose  ? aspirin EC 81 MG tablet Take 1 tablet (81 mg total) by mouth daily. Take after 12 weeks for prevention of preeclampsia later in pregnancy 300 tablet 2 01/20/2022  ? famotidine (PEPCID) 20 MG tablet Take 1 tablet (20 mg total) by mouth 2 (two) times daily. 60 tablet 1 01/20/2022  ? ferrous sulfate 325 (65 FE) MG tablet Take 1 tablet (325 mg total) by mouth every other day. 30 tablet 5 01/20/2022  ? hydrOXYzine (ATARAX) 25 MG tablet Take 1 tablet (25 mg total) by mouth 4 (four) times daily. Take 1 tablet (25 mg) at morning and noon. Take 2 tablets (50 mg) at Night 120 tablet 0 01/21/2022  ?  OLANZapine (ZYPREXA) 5 MG tablet Take 1 tablet (5 mg total) by mouth at bedtime. 30 tablet 0 01/21/2022  ? Prenatal Vit-Fe Fumarate-FA (PREPLUS) 27-1 MG TABS Take 1 tablet by mouth daily. 30 tablet 13 01/21/2022  ? sertraline (ZOLOFT) 50 MG tablet Take 1 tablet (50 mg total) by mouth daily. 30 tablet 0 01/21/2022  ? Blood Pressure Monitoring DEVI 1 each by Does not apply route once a week. 1 each 0   ? ondansetron (ZOFRAN ODT) 4 MG disintegrating tablet Take 1 tablet (4 mg total) by mouth every 6 (six) hours as needed for nausea. 20 tablet 0 More than a month  ? promethazine (PHENERGAN) 25 MG tablet Take 1 tablet (25 mg total) by mouth every 6 (six) hours as needed for nausea or vomiting. 30 tablet 0 More than a month  ? ? ? ?Review of Systems  ? ?All systems reviewed and negative except as stated in HPI ? ?Blood pressure (!) 111/49, pulse 81, temperature 98.6 ?F (37 ?C), temperature source Oral, resp. rate 19, height 5' 4.5" (1.638 m), weight 84.4 kg, last menstrual period 04/22/2021. ?General appearance: alert, cooperative, and appears stated age ?Lungs: normal work of breathing on room air ?Heart: normal rate, warm extremities ?Abdomen: soft, non-tender; gravid ?Extremities: no lower extremity edema ?Presentation: cephalic, confirmed via bedside ultrasound ?Fetal monitoring: Baseline: 130 bpm, Variability: Good {> 6 bpm), Accelerations: Reactive, and Decelerations: Absent ?Uterine activity: Frequency: Every 3-5 minutes ?Dilation: Fingertip ?Effacement (%): Thick ?Station: -3 ?Exam by:: Dr. Eulas Post ? ? ?Prenatal labs: ?ABO, Rh: --/--/B POS (04/08 9678) ?Antibody: NEG (04/08 0052) ?Rubella: 7.69 (09/26 1456) ?RPR: Non Reactive (01/18 0849)  ?HBsAg: Negative (09/26 1456)  ?HIV: Non Reactive (01/18 0849)  ?GBS: Negative/-- (03/17 1015)  ?2 hr Glucola normal ?Genetic screening  NIPS: low risk, AFP normal, [place]: silent alpha thal carrier ?Anatomy US normal, polyhydramnios noted at follow up US which resolved by 36wk  [place] ? ?Prenatal Transfer Tool  ?Maternal Diabetes: No ?Genetic Screening: Normal ?Maternal Ultrasounds/Referrals: Other:Polyhydramnios (resolved at [place]) ?Fetal Ultrasounds or other Referrals:  None ?Maternal Substance Abuse:  No ?Significant Maternal Medications:  Meds include: Zoloft Other: Pepcid, Hydroxyzine, Olanzapine  ?Significant Maternal Lab Results: Group B Strep negative ? ?Results for orders placed or performed during the hospital encounter of 01/21/22 (from the past 24 hour(s))  ?CBC  ? Collection Time: 01/21/22 12:52 AM  ?Result Value Ref Range  ? WBC 10.2 4.0 -  10.5 K/uL  ? RBC 4.28 3.87 - 5.11 MIL/uL  ? Hemoglobin 9.9 (L) 12.0 - 15.0 g/dL  ? HCT 32.1 (L) 36.0 - 46.0 %  ? MCV 75.0 (L) 80.0 - 100.0 fL  ? MCH 23.1 (L) 26.0 - 34.0 pg  ? MCHC 30.8 30.0 - 36.0 g/dL  ? RDW 16.8 (H) 11.5 - 15.5 %  ? Platelets 224 150 - 400 K/uL  ? nRBC 0.0 0.0 - 0.2 %  ?Type and screen  ? Collection Time: 01/21/22 12:52 AM  ?Result Value Ref Range  ? ABO/RH(D) B POS   ? Antibody Screen NEG   ? Sample Expiration    ?  01/24/2022,2359 ?Performed at [facility], [place] [address], [place], [place] [address] ?  ? ? ?Patient Active Problem List  ? Diagnosis Date Noted  ? Polyhydramnios affecting pregnancy in third trimester 01/16/2022  ? Unwanted fertility 12/14/2021  ? [place] multipara 11/02/2021  ? Uterine fibroid complicating antenatal care, baby not yet delivered 11/02/2021  ? Supervision of high risk pregnancy due to social problems, third trimester 11/02/2021  ? PTSD (post-traumatic stress disorder) 10/20/2021  ? MDD (major depressive disorder), recurrent episode, moderate ([place]) 10/20/2021  ? Food insecurity 09/21/2021  ? Adjustment disorder with mixed anxiety and depressed mood 09/05/2021  ? Cannabis use disorder 09/05/2021  ? Tetrahydrocannabinol (THC) use disorder, mild, abuse 08/08/2021  ? Alpha thalassemia silent carrier 08/05/2021  ? Supervision of high risk pregnancy, antepartum 05/23/2021  ? AMA (advanced  maternal age) multigravida 35+ 05/23/2021  ? Anxiety 09/22/2020  ? PMDD (premenstrual dysphoric disorder) 07/28/2020  ? Anemia 07/06/2011  ? ? ?Assessment/Plan:  ?Holly Vazquez is a 41 y.o. G6P5006 at 27w1dhere for I

## 2022-01-21 NOTE — Progress Notes (Signed)
Checked patient's cervix and at introitus rash noted. Patient reports sore and tender to touch and has been there for 1 week.  ? ?Denies hx of HSV. Reports no pain on the inside just at skin edge/labia majora area. ? ?On exam, appears to be red and irritated. Does not appear to be herpetic (no vesicles, ulceration, no pustules, no erosions). Also with thick white vaginal discharge. ? ?Dr. Harolyn Rutherford called to assess rash and pictures placed in media tab of patient's chart. Dr. Arther Abbott assessment does not appear herpectic. Especially given patient has no prior hx of HSV and lesions do not resemble primary HSV lesions.   ? ?Will proceed with IOL and anticipate SVD. Also do wet mount and GC/CT swab. ? ?Renard Matter, MD, MPH ?OB Fellow, Faculty Practice ? ?

## 2022-01-21 NOTE — Anesthesia Procedure Notes (Signed)
Epidural ?Patient location during procedure: OB ?Start time: 01/21/2022 10:27 AM ?End time: 01/21/2022 10:32 AM ? ?Staffing ?Anesthesiologist: Janeece Riggers, MD ? ?Preanesthetic Checklist ?Completed: patient identified, IV checked, site marked, risks and benefits discussed, surgical consent, monitors and equipment checked, pre-op evaluation and timeout performed ? ?Epidural ?Patient position: sitting ?Prep: DuraPrep and site prepped and draped ?Patient monitoring: continuous pulse ox and blood pressure ?Approach: midline ?Location: L3-L4 ?Injection technique: LOR air ? ?Needle:  ?Needle type: Tuohy  ?Needle gauge: 17 G ?Needle length: 9 cm and 9 ?Needle insertion depth: 6 cm ?Catheter type: closed end flexible ?Catheter size: 19 Gauge ?Catheter at skin depth: 12 cm ?Test dose: negative ? ?Assessment ?Events: blood not aspirated, injection not painful, no injection resistance, no paresthesia and negative IV test ? ? ? ?

## 2022-01-21 NOTE — Progress Notes (Signed)
Labor Progress Note ?Holly Vazquez is a 41 y.o. G6P5006 at 68w1dpresented for IOL, elective ?S: Has FB and feeling cramping and contractions.  ? ?O:  ?BP 139/71   Pulse 87   Temp 98.8 ?F (37.1 ?C) (Oral)   Resp 16   Ht 5' 4.5" (1.638 m)   Wt 84.4 kg   LMP 04/22/2021   BMI 31.45 kg/m?  ?EFM: 140/mod/+accelts ?Toco: q2-3 min ctx ? ?CVE: Dilation: 2 ?Effacement (%): Thick ?Station: -3 ?Presentation: Vertex ?Exam by:: UKoreaby Dr. DCy Blamer? ? ?A&P: 41y.o. G6P5006 323w1dIOL ?#Labor: Progressing well. FB in place. Plan for AROM when FB out ?#Pain: planning on epidural, plans to get with FB out ?#FWB: Cat 1 ?#GBS negative ? ?# yeast vaginitis: plan to plan AD plus lidocaine and treate with diflucan ? ?KiCaren MacadamMD ?10:13 AM ? ?

## 2022-01-21 NOTE — Anesthesia Postprocedure Evaluation (Signed)
Anesthesia Post Note ? ?Patient: Holly Vazquez ? ?Procedure(s) Performed: AN AD HOC LABOR EPIDURAL ? ?  ? ?Patient location during evaluation: Mother Baby ?Anesthesia Type: Epidural ?Level of consciousness: awake and alert ?Pain management: pain level controlled ?Vital Signs Assessment: post-procedure vital signs reviewed and stable ?Respiratory status: spontaneous breathing, nonlabored ventilation and respiratory function stable ?Cardiovascular status: stable ?Postop Assessment: no headache, no backache and epidural receding ?Anesthetic complications: no ? ? ?No notable events documented. ? ?Last Vitals:  ?Vitals:  ? 01/21/22 2234 01/21/22 2300  ?BP:  136/74  ?Pulse:  93  ?Resp:  18  ?Temp: 37.1 ?C 36.8 ?C  ?SpO2:    ?  ?Last Pain:  ?Vitals:  ? 01/21/22 2300  ?TempSrc: Oral  ?PainSc: 0-No pain  ? ?Pain Goal:   ? ?  ?  ?  ?  ?  ?  ?Epidural/Spinal Function Cutaneous sensation: Normal sensation (01/21/22 2300), Patient able to flex knees: Yes (01/21/22 2300), Patient able to lift hips off bed: Yes (01/21/22 2300), Back pain beyond tenderness at insertion site: No (01/21/22 2300), Progressively worsening motor and/or sensory loss: No (01/21/22 2300), Bowel and/or bladder incontinence post epidural: No (01/21/22 2300) ? ?Tobin Witucki ? ? ? ? ?

## 2022-01-21 NOTE — Anesthesia Postprocedure Evaluation (Signed)
Anesthesia Post Note ? ?Patient: Holly Vazquez ? ?Procedure(s) Performed: AN AD HOC LABOR EPIDURAL ? ?  ? ?Anesthesia Post Evaluation ?No notable events documented. ? ?Last Vitals:  ?Vitals:  ? 01/21/22 2234 01/21/22 2300  ?BP:  136/74  ?Pulse:  93  ?Resp:  18  ?Temp: 37.1 ?C 36.8 ?C  ?SpO2:    ?  ?Last Pain:  ?Vitals:  ? 01/21/22 2300  ?TempSrc: Oral  ?PainSc: 0-No pain  ? ?Pain Goal:   ? ?  ?  ?  ?  ?  ?  ?Epidural/Spinal Function Cutaneous sensation: Normal sensation (01/21/22 2300), Patient able to flex knees: Yes (01/21/22 2300), Patient able to lift hips off bed: Yes (01/21/22 2300), Back pain beyond tenderness at insertion site: No (01/21/22 2300), Progressively worsening motor and/or sensory loss: No (01/21/22 2300), Bowel and/or bladder incontinence post epidural: No (01/21/22 2300) ? ?Holly Vazquez ? ? ? ? ?

## 2022-01-21 NOTE — Discharge Summary (Signed)
? ?  Postpartum Discharge Summary ? ?   ?Patient Name: Holly Vazquez ?DOB: 06-10-1981 ?MRN: [id number] ? ?Date of admission: 01/21/2022 ?Delivery date:01/21/2022  ?Delivering provider: Starr Lake  ?Date of discharge: 01/23/2022 ? ?Admitting diagnosis: Polyhydramnios affecting pregnancy in third trimester [O40.3XX0] ?Intrauterine pregnancy: [redacted]w[redacted]d    ?Secondary diagnosis:  Principal Problem: ?  Vaginal delivery ?Active Problems: ?  Supervision of high risk pregnancy, antepartum ?  AMA (advanced maternal age) multigravida 336+?  Alpha thalassemia silent carrier ?  PTSD (post-traumatic stress disorder) ?  MDD (major depressive disorder), recurrent episode, moderate (HO'[place] ?  Uterine fibroid complicating antenatal care, baby not yet delivered ?  Polyhydramnios affecting pregnancy in third trimester ? ?Additional problems: None   ?Discharge diagnosis: Term Pregnancy Delivered                                              ?Post partum procedures: None ?Augmentation: AROM, Pitocin, and Cytotec ?Complications: None ? ?Hospital course: Induction of Labor With Vaginal Delivery   ?41y.o. yo [id number] 353w1das admitted to the hospital 01/21/2022 for induction of labor.  Indication for induction: 39 weeks, elective.  Patient had an uncomplicated labor course and vaginal delivery. ?Membrane Rupture Time/Date: 4:17 PM ,01/21/2022   ?Delivery Method:Vaginal, Spontaneous  ?Episiotomy: None  ?Lacerations:  None  ?Details of delivery can be found in separate delivery note.  Patient had a routine postpartum course.  She is eating, drinking, voiding, and ambulating without issue.  Her pain and bleeding are well controlled.  She is breast and formula feeding well.  Patient is discharged home 01/23/22. ? ?Newborn Data: ?Birth date:01/21/2022  ?Birth time:9:05 PM  ?Gender:Female  ?Living status:Living  ?Apgars:8 ,9  ?Weight:3340 g  ? ?Magnesium Sulfate received: No ?BMZ received: No ?Rhophylac: N/A ?MMR: N/A ?T-DaP: Given  prenatally ?Flu: Declined ?Transfusion: No ? ?Physical exam  ?Vitals:  ? 01/22/22 1206 01/22/22 1724 01/22/22 2036 01/23/22 0545  ?BP: 109/74 131/78 125/72 127/84  ?Pulse: 75 73 79 65  ?Resp: 18 16 18 18   ?Temp: 97.6 ?F (36.4 ?C) 97.8 ?F (36.6 ?C) 97.8 ?F (36.6 ?C) 97.6 ?F (36.4 ?C)  ?TempSrc: Oral Oral Oral Oral  ?SpO2: 96% 100%    ?Weight:      ?Height:      ? ?General: alert, cooperative, and no distress ?Lochia: appropriate ?Uterine Fundus: firm and below umbilicus  ?DVT Evaluation: no LE edema or calf tenderness to palpation  ? ?Labs: ?Lab Results  ?Component Value Date  ? WBC 10.2 01/21/2022  ? HGB 9.9 (L) 01/21/2022  ? HCT 32.1 (L) 01/21/2022  ? MCV 75.0 (L) 01/21/2022  ? PLT 224 01/21/2022  ? ? ?  Latest Ref Rng & Units 07/11/2021  ?  2:56 PM  ?CMP  ?Glucose 70 - 99 mg/dL 90    ?BUN 6 - 20 mg/dL 8    ?Creatinine 0.57 - 1.00 mg/dL 0.58    ?Sodium 134 - 144 mmol/L 137    ?Potassium 3.5 - 5.2 mmol/L 3.9    ?Chloride 96 - 106 mmol/L 102    ?CO2 20 - 29 mmol/L 14    ?Calcium 8.7 - 10.2 mg/dL 9.3    ?Total Protein 6.0 - 8.5 g/dL 6.6    ?Total Bilirubin 0.0 - 1.2 mg/dL <0.2    ?Alkaline Phos 44 - 121 IU/L 58    ?  AST 0 - 40 IU/L 13    ?ALT 0 - 32 IU/L 9    ? ?Edinburgh Score: ? ?  01/23/2022  ?  5:49 AM  ?Flavia Shipper Postnatal Depression Scale Screening Tool  ?I have been able to laugh and see the funny side of things. 0  ?I have looked forward with enjoyment to things. 0  ?I have blamed myself unnecessarily when things went wrong. 1  ?I have been anxious or worried for no good reason. 0  ?I have felt scared or panicky for no good reason. 0  ?Things have been getting on top of me. 0  ?I have been so unhappy that I have had difficulty sleeping. 0  ?I have felt sad or miserable. 0  ?I have been so unhappy that I have been crying. 0  ?The thought of harming myself has occurred to me. 0  ?Edinburgh Postnatal Depression Scale Total 1  ? ? ? ?After visit meds:  ?Allergies as of 01/23/2022   ? ?   Reactions  ? Flagyl  [metronidazole] Hives  ? Latex Hives  ? Nickel Dermatitis  ? trigger  ? Penicillins Rash  ? ?  ? ?  ?Medication List  ?  ? ?STOP taking these medications   ? ?aspirin EC 81 MG tablet ?  ?famotidine 20 MG tablet ?Commonly known as: Pepcid ?  ?ondansetron 4 MG disintegrating tablet ?Commonly known as: Zofran ODT ?  ?promethazine 25 MG tablet ?Commonly known as: PHENERGAN ?  ? ?  ? ?TAKE these medications   ? ?acetaminophen 500 MG tablet ?Commonly known as: TYLENOL ?Take 2 tablets (1,000 mg total) by mouth every 8 (eight) hours as needed (pain). ?  ?Blood Pressure Monitoring Devi ?1 each by Does not apply route once a week. ?  ?ferrous sulfate 325 (65 FE) MG tablet ?Take 1 tablet (325 mg total) by mouth every other day. ?  ?hydrOXYzine 25 MG tablet ?Commonly known as: ATARAX ?Take 1 tablet (25 mg total) by mouth 4 (four) times daily. Take 1 tablet (25 mg) at morning and noon. Take 2 tablets (50 mg) at Night ?What changed:  ?how much to take ?when to take this ?reasons to take this ?  ?ibuprofen 600 MG tablet ?Commonly known as: ADVIL ?Take 1 tablet (600 mg total) by mouth every 6 (six) hours as needed (pain). ?  ?OLANZapine 5 MG tablet ?Commonly known as: ZyPREXA ?Take 1 tablet (5 mg total) by mouth at bedtime. ?  ?PrePLUS 27-1 MG Tabs ?Take 1 tablet by mouth daily. ?  ?sertraline 50 MG tablet ?Commonly known as: Zoloft ?Take 1 tablet (50 mg total) by mouth daily. ?  ? ?  ? ? ? ?Discharge home in stable condition ?Infant Feeding: Bottle and Breast ?Infant Disposition: home with mother ?Discharge instruction: per After Visit Summary and Postpartum booklet. ?Activity: Advance as tolerated. Pelvic rest for 6 weeks.  ?Diet: routine diet ?Future Appointments: ?Future Appointments  ?Date Time Provider [place]  ?01/25/2022 10:00 AM Moses Manners I, LCSWA [facility] None  ?02/02/2022  4:00 PM Pashayan, Redgie Grayer, MD [facility] None  ? ?Follow up Visit: ?Message sent to [facility] by Dr. Gwenlyn Perking on 01/23/22.  ? ?Please schedule  this patient for a In person postpartum visit in 4-6 weeks with the following provider: Any provider. ?Additional Postpartum F/U: Postpartum Depression checkup  ?Low risk pregnancy complicated by:  Anxiety/MDD, grand multiparity ?Delivery mode:  Vaginal, Spontaneous  ?Anticipated Birth Control:   Unsure, considering options to also help  with PMDD, will discuss further at postpartum visit ? ?01/23/2022 ?Genia Del, MD ? ? ? ?

## 2022-01-21 NOTE — Progress Notes (Signed)
Labor Progress Note ?[name] is a 41 y.o. G6P5006 at 37w1dpresented for eIOL ? ?S: slept well and feels comfortable with epidural.  ? ?O:  ?BP 128/79   Pulse 84   Temp 98.5 ?F (36.9 ?C) (Oral)   Resp 17   Ht 5' 4.5" (1.638 m)   Wt 84.4 kg   LMP 04/22/2021   SpO2 99%   BMI 31.45 kg/m?  ?EFM: 150/Mod/Accels present and no decels ? ?CVE: Dilation: 6.5 ?Effacement (%): 50 ?Cervical Position: Middle ?Station: -2 ?Presentation: Vertex ?Exam by:: Holly ChesterMD ? ?AROM performed with scant fluid ? ?A&P: 41y.o. [id number] IOL  ?#Labor: Progressing well. AROM performed. Anticipate vaginal birth ?#Pain: s/p epidural ?#FWB: cat 1 ?#GBS negative ? ?#h/o depression/anxiety; Doing well ? ?KiCaren MacadamMD ?4:37 PM ? ?

## 2022-01-21 NOTE — Lactation Note (Signed)
This note was copied from a baby's chart. ?Lactation Consultation Note ?Mom had baby latched and was BF well. LC just adjusted position to get baby closer so baby not pulling on nipple. ?Mom has 39 yr old twins that she BF for 5 weeks, Holly 70, girl 20, girl 98, Holly 13 that she BF for the most was 1 yr. But mom BF all of them. ?Discussed positioning and BF STS. ?Will f/u on MBU. ? ?Patient Name: Holly Vazquez ?Today's Date: 01/21/2022 ?Reason for consult: L&D Initial assessment;Term ?Age:59 hours ? ?Maternal Data ?Does the patient have breastfeeding experience prior to this delivery?: Yes ?How long did the patient breastfeed?: 1 yr down to 5 weeks for her twins ? ?Feeding ?  ? ?LATCH Score ?Latch: Grasps breast easily, tongue down, lips flanged, rhythmical sucking. ? ?Audible Swallowing: None ? ?Type of Nipple: Everted at rest and after stimulation ? ?Comfort (Breast/Nipple): Soft / non-tender ? ?Hold (Positioning): Assistance needed to correctly position infant at breast and maintain latch. ? ?LATCH Score: 7 ? ? ?Lactation Tools Discussed/Used ?  ? ?Interventions ?Interventions: Adjust position;Support pillows ? ?Discharge ?  ? ?Consult Status ?Consult Status: Follow-up from L&D ?Date: 01/22/22 ?Follow-up type: In-patient ? ? ? ?Theodoro Kalata ?01/21/2022, 10:03 PM ? ? ? ?

## 2022-01-22 NOTE — Progress Notes (Signed)
Post Partum Day #1 ?Subjective: ?no complaints and up ad lib ? ?Objective: ?Blood pressure 115/72, pulse 66, temperature 97.8 ?F (36.6 ?C), temperature source Oral, resp. rate 18, height 5' 4.5" (1.638 m), weight 84.4 kg, last menstrual period 04/22/2021, SpO2 99 %, unknown if currently breastfeeding. ? ?Physical Exam:  ?General: alert, cooperative, and no distress ?Lochia: appropriate ?Uterine Fundus: firm ?Incision: NA ?DVT Evaluation: No evidence of DVT seen on physical exam. ? ?Recent Labs  ?  01/21/22 ?0052  ?HGB 9.9*  ?HCT 32.1*  ? ? ?Assessment/Plan: ?Plan for discharge tomorrow ?Patient does not want BTL right now; she would like to have interval BTL.  ? LOS: 1 day  ? ?Holly Vazquez ?01/22/2022, 6:29 AM  ? ? ?

## 2022-01-22 NOTE — Progress Notes (Signed)
MOB was referred for history of depression/anxiety. ?* Referral screened out by Clinical Social Worker because none of the following criteria appear to apply: ?~ History of anxiety/depression during this pregnancy, or of post-partum depression following prior delivery. ?~ Diagnosis of anxiety and/or depression within last 3 years ?OR ?* MOB's symptoms currently being treated with medication and/or therapy. MOB has an active Rx doe Zoloft and Vistaril.  MOB is also established with behavior health therapist Roselyn Reef at [facility].  ? ?Please contact the Clinical Social Worker if needs arise, by [facility] request, or if MOB scores greater than 9/yes to question 10 on Edinburgh Postpartum Depression Screen ? ?CSW received consult for hx of marijuana use.  Referral was screened out due to the following: ?~MOB had no documented substance use after initial prenatal visit/+UPT. ?~MOB had no positive drug screens after initial prenatal visit/+UPT. ?~Baby's UDS is negative. ? ?Please consult CSW if current concerns arise or by MOB's request. ? ?CSW will monitor CDS results and make report to Child Protective Services if warranted. ? ? Laurey Arrow, MSW, LCSW ?Clinical Social Work ?[phone] ? ?

## 2022-01-22 NOTE — Lactation Note (Signed)
This note was copied from a baby's chart. ?Lactation Consultation Note ? ?Patient Name: Holly Vazquez ?Today's Date: 01/22/2022 ?Reason for consult: Follow-up assessment ?Age:39 hours ? ?P7, Ex BF.  Mother denies questions or concerns. ?Feed on demand with cues.  Goal 8-12+ times per day after first 24 hrs.  Place baby STS if not cueing.  ?Mom made aware of O/P services, breastfeeding support groups, community resources, and our phone # for post-discharge questions.  ?Mother will call for assistance as needed.  ? ?Maternal Data ?Does the patient have breastfeeding experience prior to this delivery?: Yes ?How long did the patient breastfeed?:  (5 weeks to one year, average 3 mos.) ? ?LATCH Score ?Latch: Repeated attempts needed to sustain latch, nipple held in mouth throughout feeding, stimulation needed to elicit sucking reflex. ? ?Audible Swallowing: A few with stimulation ? ?Type of Nipple: Everted at rest and after stimulation ? ?Comfort (Breast/Nipple): Soft / non-tender ? ?Hold (Positioning): No assistance needed to correctly position infant at breast. ? ?LATCH Score: 8 ? ? ?Interventions ?Interventions: Breast feeding basics reviewed;Education;LC Services brochure ? ?Consult Status ?Consult Status: PRN ? ? ? ?Vivianne Master Boschen ?01/22/2022, 9:18 AM ? ? ? ?

## 2022-01-23 ENCOUNTER — Telehealth: Payer: Self-pay | Admitting: Family Medicine

## 2022-01-23 MED ORDER — IBUPROFEN 600 MG PO TABS
600.0000 mg | ORAL_TABLET | Freq: Four times a day (QID) | ORAL | 0 refills | Status: AC | PRN
Start: 2022-01-23 — End: ?

## 2022-01-23 MED ORDER — ACETAMINOPHEN 500 MG PO TABS
1000.0000 mg | ORAL_TABLET | Freq: Three times a day (TID) | ORAL | 0 refills | Status: AC | PRN
Start: 2022-01-23 — End: ?

## 2022-01-23 NOTE — Lactation Note (Signed)
This note was copied from a baby's chart. ?Lactation Consultation Note ? ?Patient Name: Holly Vazquez ?Today's Date: 01/23/2022 ?Reason for consult: Follow-up assessment;Nipple pain/trauma;Term ?Age:39 hours ?Per mom supplemented the baby with formula because the baby didn't seem satisfied.  ?Per mom nipples and breast are sore. LC offered to assess , and noted both breast to feel warm and both nipples clear, no breakdown, just areola edema ( semi compressible )  ?LC reassured mom her milk was coming in and probably the reason she was feeling discomfort was hormone related and areola edema.  ?LC recommended prior to latching - breast massage, hand express, pre - pump with the hand pump and reverse pressure ( as instructed ) prior to latch.  ?LC showed mom how deep onto the areola the baby's mouth should be when latched with depth to prevent further soreness .  ?Sore nipple and engorgement prevention and tx reviewed.  ?LC provided shells with instructions and the hand pump with the breast pump kit.  ?[place] reviewed D/C breast feeding teaching and provided the BF D/C sheet.  ? ? ?Maternal Data ?  ? ?Feeding ?Mother's Current Feeding Choice: Breast Milk and Formula ? ?LATCH Score ?  ? ?  ? ?  ? ?  ? ?  ? ?  ? ? ?Lactation Tools Discussed/Used ?  ? ?Interventions ?Interventions: Breast feeding basics reviewed;Education;LC Services brochure ? ?Discharge ?Discharge Education: Engorgement and breast care;Warning signs for feeding baby ?Pump: DEBP;Personal;Manual ? ?Consult Status ?Consult Status: Complete ?Date: 01/23/22 ? ? ? ?[name] ?01/23/2022, 12:44 PM ? ? ? ?

## 2022-01-23 NOTE — Telephone Encounter (Signed)
Called patient to schedule postpartum appointment, there was no answer to the phone call so a voicemail was left and a letter was mailed.  ?

## 2022-01-24 NOTE — BH Specialist Note (Signed)
Integrated Behavioral Health via Telemedicine Visit ? ?02/02/2022 ?Holly Vazquez ?[id number] ? ?Number of [facility] Clinician visits: 1- Initial Visit ? ?Session Start time: 0822 ?  ?Session End time: 0830 ? ?Total time in minutes: 8 ? ? ?Referring Provider: Arlina Robes, MD ?Patient/Family location: Home ?[facility] Provider location: [facility] [employer] [facility] ? ?All persons participating in visit: [name] and [place]  ? ?Types of Service: Individual psychotherapy and Telephone visit ? ?I connected with [name] and/or [name] Climer's  n/a  via  Telephone or Video Enabled Telemedicine Application  (Video is Caregility application) and verified that I am speaking with the correct person using two identifiers. Discussed confidentiality: Yes  ? ?I discussed the limitations of telemedicine and the availability of in person appointments.  Discussed there is a possibility of technology failure and discussed alternative modes of communication if that failure occurs. ? ?I discussed that engaging in this telemedicine visit, they consent to the provision of behavioral healthcare and the services will be billed under their insurance. ? ?Patient and/or legal guardian expressed understanding and consented to Telemedicine visit: Yes  ? ?Presenting Concerns: ?Patient and/or family reports the following symptoms/concerns: Adjusting to new baby schedule; [place] medication management and therapy at [facility].  ?Duration of problem: Postpartum, less than 2 weeks; Severity of problem: moderate ? ?Patient and/or Family's Strengths/Protective Factors: ?Social connections, Concrete supports in place (healthy food, safe environments, etc.), Sense of purpose, and Physical Health (exercise, healthy diet, medication compliance, etc.) ? ?Goals Addressed: ?Patient will: ? Demonstrate ability to: Increase healthy adjustment to current life  circumstances ? ?Progress towards Goals: ?Ongoing ? ?Interventions: ?Interventions utilized:  Psychoeducation and/or Health Education and Link to [employer] ?Standardized Assessments completed: Not Needed ? ?Patient and/or Family Response: Patient agrees with treatment plan. ? ? ?Assessment: ?Patient currently experiencing Other specified counseling.  ? ?Patient may benefit from brief check today and ongoing care at [facility]. ? ?Plan: ?Follow up with behavioral health clinician on : As needed ?Behavioral recommendations:  ?-Continue taking BH medications at [facility]; f/u with psychiatry today ?-Continue taking iron pills as prescribed by medical providers at [facility] ?-Continue prioritizing healthy self-care (sleep when baby sleeps; regular meals) ?-Consider new mom support groups at either [email or web address] or www.[email or web address].net as needed ?Referral(s): Integrated Orthoptist (In Clinic) and [employer]:  new mom support ? ?I discussed the assessment and treatment plan with the patient and/or parent/guardian. They were provided an opportunity to ask questions and all were answered. They agreed with the plan and demonstrated an understanding of the instructions. ?  ?They were advised to call back or seek an in-person evaluation if the symptoms worsen or if the condition fails to improve as anticipated. ? ?Garlan Fair, LCSW ?

## 2022-01-25 ENCOUNTER — Ambulatory Visit (HOSPITAL_COMMUNITY): Payer: Medicaid Other | Admitting: Licensed Clinical Social Worker

## 2022-01-30 ENCOUNTER — Encounter: Payer: Self-pay | Admitting: *Deleted

## 2022-02-01 ENCOUNTER — Ambulatory Visit (HOSPITAL_COMMUNITY): Payer: Medicaid Other | Admitting: Licensed Clinical Social Worker

## 2022-02-02 ENCOUNTER — Encounter (HOSPITAL_COMMUNITY): Payer: Medicaid Other | Admitting: Student in an Organized Health Care Education/Training Program

## 2022-02-02 ENCOUNTER — Telehealth (HOSPITAL_COMMUNITY): Payer: Self-pay | Admitting: *Deleted

## 2022-02-02 ENCOUNTER — Ambulatory Visit (INDEPENDENT_AMBULATORY_CARE_PROVIDER_SITE_OTHER): Payer: Medicaid Other | Admitting: Clinical

## 2022-02-02 DIAGNOSIS — Z7189 Other specified counseling: Secondary | ICD-10-CM | POA: Diagnosis not present

## 2022-02-02 NOTE — Patient Instructions (Signed)
[facility] [address] [place], [place] [address] [phone] (main office) [phone] (Keyonia Gluth's office)  New Parent Support Groups [email or web address].net [email or web address]   

## 2022-02-02 NOTE — Telephone Encounter (Signed)
Attempted hospital discharge follow-up call. Left message for patient to return RN call. Erline Levine, RN, 02/02/22, 1704 ?

## 2022-02-09 ENCOUNTER — Encounter (HOSPITAL_COMMUNITY): Payer: Self-pay | Admitting: Student in an Organized Health Care Education/Training Program

## 2022-02-09 ENCOUNTER — Ambulatory Visit (INDEPENDENT_AMBULATORY_CARE_PROVIDER_SITE_OTHER): Payer: Medicaid Other | Admitting: Student in an Organized Health Care Education/Training Program

## 2022-02-09 VITALS — BP 124/78 | HR 84 | Resp 18 | Ht <= 58 in | Wt 172.0 lb

## 2022-02-09 DIAGNOSIS — F419 Anxiety disorder, unspecified: Secondary | ICD-10-CM | POA: Diagnosis not present

## 2022-02-09 DIAGNOSIS — F4310 Post-traumatic stress disorder, unspecified: Secondary | ICD-10-CM | POA: Diagnosis not present

## 2022-02-09 DIAGNOSIS — F331 Major depressive disorder, recurrent, moderate: Secondary | ICD-10-CM | POA: Diagnosis not present

## 2022-02-09 MED ORDER — OLANZAPINE 5 MG PO TABS: 5.0000 mg | ORAL_TABLET | Freq: Every day | ORAL | 0 refills | Status: DC

## 2022-02-09 MED ORDER — HYDROXYZINE HCL 25 MG PO TABS: 25.0000 mg | ORAL_TABLET | Freq: Four times a day (QID) | ORAL | 0 refills | Status: AC

## 2022-02-09 MED ORDER — SERTRALINE HCL 100 MG PO TABS: 100.0000 mg | ORAL_TABLET | Freq: Every day | ORAL | 0 refills | Status: DC
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
## 2022-02-13 ENCOUNTER — Ambulatory Visit: Payer: Medicaid Other | Admitting: Obstetrics and Gynecology

## 2022-02-15 ENCOUNTER — Encounter: Payer: Self-pay | Admitting: Obstetrics & Gynecology

## 2022-02-15 ENCOUNTER — Ambulatory Visit (INDEPENDENT_AMBULATORY_CARE_PROVIDER_SITE_OTHER): Payer: Medicaid Other | Admitting: Obstetrics & Gynecology

## 2022-02-15 NOTE — Progress Notes (Signed)
? ? ?  Post Partum Visit Note ? ?Holly Vazquez is a 41 y.o. [id number] female who presents for a postpartum visit. She is  3.4  weeks postpartum following a normal spontaneous vaginal delivery.  I have fully reviewed the prenatal and intrapartum course. The delivery was at 39.1 gestational weeks.  Anesthesia: epidural. Postpartum course has been good. Baby is doing well. Baby is feeding by bottle - Holly Vazquez . Bleeding staining only. Bowel function is normal. Bladder function is normal. Patient is not sexually active. Contraception method is abstinence. Postpartum depression screening: negative. ? ? ?The pregnancy intention screening data noted above was reviewed. Potential methods of contraception were discussed. The patient elected to proceed with No data recorded. ? ? ? ?Health Maintenance Due  ?Topic Date Due  ? COVID-19 Vaccine (3 - Booster for Pfizer series) 03/24/2020  ? ? ?The following portions of the patient's history were reviewed and updated as appropriate: allergies, current medications, past family history, past medical history, past social history, past surgical history, and problem list. ? ?Review of Systems ?Pertinent items are noted in HPI. ? ?Objective:  ?LMP 04/22/2021   ? ?General:  alert, cooperative, and no distress  ? Breasts:  not indicated  ?Lungs: Effort normal  ?Heart:  regular rate and rhythm  ?Abdomen: soft, non-tender; bowel sounds normal; no masses,  no organomegaly   ?Wound N/a  ?GU exam:  not indicated  ?     ?Assessment:  ? ? There are no diagnoses linked to this encounter. ? ?normal postpartum exam.  ? ?Plan:  ? ?Essential components of care per ACOG recommendations: ? ?1.  Mood and well being: Patient with negative depression screening today. Reviewed local resources for support.  ?- Patient tobacco use? No.   ?- hx of drug use? Yes. Discussed support systems and outpatient/inpatient treatment options.   ? ?2. Infant care and feeding:  ?-Patient currently breastmilk feeding?  No.  ?-Social determinants of health (SDOH) reviewed in EPIC. No concerns ? ?3. Sexuality, contraception and birth spacing ?- Patient does not want a pregnancy in the next year.  Desired family size is 7 children.  ?- Reviewed reproductive life planning. Reviewed contraceptive methods based on pt preferences and effectiveness.  Patient desired Abstinence today.   ?- Discussed birth spacing of 18 months ? ?4. Sleep and fatigue ?-Encouraged family/partner/community support of 4 hrs of uninterrupted sleep to help with mood and fatigue ? ?5. Physical Recovery  ?- Discussed patients delivery and complications. She describes her labor as good. ?- Patient had a Vaginal, no problems at delivery.  ?- Patient has urinary incontinence? No. ?- Patient is safe to resume physical and sexual activity ? ?6.  Health Maintenance ?- HM due items addressed Yes ?- Last pap smear  ?Diagnosis  ?Date Value Ref Range Status  ?09/22/2020   Final  ? - Negative for intraepithelial lesion or malignancy (NILM)  ? Pap smear not done at today's visit.  ?-Breast Cancer screening indicated? No.  ? ?7. Chronic Disease/Pregnancy Condition follow up: None ? ?- PCP follow up ?Holly Mode, MD ? ?[facility] [place]  ?

## 2022-02-16 ENCOUNTER — Ambulatory Visit: Payer: Self-pay | Admitting: Student
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
## 2022-03-15 ENCOUNTER — Ambulatory Visit (INDEPENDENT_AMBULATORY_CARE_PROVIDER_SITE_OTHER): Payer: Medicaid Other | Admitting: Licensed Clinical Social Worker

## 2022-03-15 DIAGNOSIS — F411 Generalized anxiety disorder: Secondary | ICD-10-CM

## 2022-03-16 DIAGNOSIS — F411 Generalized anxiety disorder: Secondary | ICD-10-CM | POA: Insufficient documentation

## 2022-03-23 ENCOUNTER — Ambulatory Visit (INDEPENDENT_AMBULATORY_CARE_PROVIDER_SITE_OTHER): Payer: Medicaid Other | Admitting: Student in an Organized Health Care Education/Training Program

## 2022-03-23 ENCOUNTER — Encounter (HOSPITAL_COMMUNITY): Payer: Self-pay | Admitting: Student in an Organized Health Care Education/Training Program

## 2022-03-23 VITALS — BP 126/64 | HR 86 | Ht <= 58 in | Wt 195.0 lb

## 2022-03-23 DIAGNOSIS — F4310 Post-traumatic stress disorder, unspecified: Secondary | ICD-10-CM | POA: Diagnosis not present

## 2022-03-23 DIAGNOSIS — F419 Anxiety disorder, unspecified: Secondary | ICD-10-CM | POA: Diagnosis not present

## 2022-03-23 DIAGNOSIS — F331 Major depressive disorder, recurrent, moderate: Secondary | ICD-10-CM | POA: Diagnosis not present

## 2022-03-23 MED ORDER — ARIPIPRAZOLE 5 MG PO TABS: 5.0000 mg | ORAL_TABLET | Freq: Every day | ORAL | 0 refills | Status: DC

## 2022-03-23 MED ORDER — HYDROXYZINE HCL 25 MG PO TABS: 25.0000 mg | ORAL_TABLET | Freq: Four times a day (QID) | ORAL | 0 refills | Status: AC

## 2022-03-23 MED ORDER — SERTRALINE HCL 100 MG PO TABS: 150.0000 mg | ORAL_TABLET | Freq: Every day | ORAL | 0 refills | Status: DC

## 2022-03-23 NOTE — Progress Notes (Signed)
BH MD/PA/NP OP Progress Note  03/23/2022 3:52 PM Holly Vazquez  MRN:  [id number]  Chief Complaint:  Chief Complaint  Patient presents with   Medication Management   HPI:  Holly Vazquez is a 41 yr old female who presents for continued medication management for her depression and anxiety.  PPHx is significant for MDD, Recurrent, Moderate, PMDD, Adjustment Disorder, Anxiety, PTSD.  No hospitalizations or suicide attempts.  She reports that things have not been going so great since her last appointment.  She reports that her mood has been worse and her anxiety has been increased.  She states her sleep has started to suffer some.  She also reports significant weight gain that happened since our last appointment.  She states she stopped taking her Zyprexa for about 1 week because she thought this was what was causing her weight gain.  She states her symptoms significantly worsened in that time so she restarted it and was waiting until this follow-up appointment to discuss.  She reports no SI, HI, or AVH.  She reports her appetite has been significantly increased "I have been eating for 3."  She reports her sleep has had some decrease.  Discussed that we would stop her Zyprexa and transition her to Abilify for augmentation of her Zoloft.  Discussed that this medication is the most weight neutral and will still give the benefit of increasing effectiveness of her Zoloft.  Discussed that we would also increase her Zoloft.  She was agreeable to this.  Discussed with her what to do in the event of a future crisis.  Discussed that she can return to [facility], go to the [facility], go to the nearest ED, or call [phone] or [phone].   She reported understanding and had no concerns.   Visit Diagnosis:    ICD-10-CM   1. MDD (major depressive disorder), recurrent episode, moderate (HCC)  F33.1 ARIPiprazole (ABILIFY) 5 MG tablet    sertraline (ZOLOFT) 100 MG tablet    hydrOXYzine (ATARAX) 25 MG tablet    2. PTSD  (post-traumatic stress disorder)  F43.10     3. Anxiety  F41.9       Past Psychiatric History: MDD, Recurrent, Moderate, PMDD, Adjustment Disorder, Anxiety, PTSD.  No hospitalizations or suicide attempt.  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   Anemia    due to heavy menses, on iron   Anxiety    Cervical polyp 03/20/2018   Removed 03-20-18   Contact dermatitis and other eczema, due to unspecified cause    Depression    Fibroids    Heart murmur    States always told it was from anemia   Restless legs syndrome (RLS)    Urinary tract infection, site not specified     Past Surgical History:  Procedure Laterality Date   WISDOM TOOTH EXTRACTION      Family Psychiatric History: Father - Depression, Anxiety, Polysubstance abuse (EtOH, Crack, PCP) Mother - Depression, Anxiety, Crack abuse Maternal Grandmother - Anxiety Adopted Cousin - Suicide at age 49  Family History:  Family History  Problem Relation Age of Onset   Hypertension Father    Heart disease Maternal Grandmother    Stroke Maternal Grandmother    Hypertension Maternal Grandmother    Eczema Son    Eczema Son    Eczema Son    Lung cancer Mother     Social History:  Social History   Socioeconomic History   Marital status: Married    Spouse name: Not on file   Number of children: Not on file   Years of education: Not on file   Highest education level: Not on file  Occupational History   Not on file  Tobacco Use   Smoking status: Never   Smokeless tobacco: Never  Vaping Use   Vaping Use: Never used  Substance and Sexual Activity   Alcohol use: No   Drug use: Yes    Types: Marijuana   Sexual activity: Yes    Birth control/protection: None    Comment: Husband - vasectomy  Other Topics Concern   Not on file  Social History Narrative   Not on file   Social Determinants of Health    Financial Resource Strain: Not on file  Food Insecurity: Food Insecurity Present (01/16/2022)   Hunger Vital Sign    Worried About Running Out of Food in the Last Year: Sometimes true    Ran Out of Food in the Last Year: Sometimes true  Transportation Needs: No Transportation Needs (01/16/2022)   PRAPARE - Hydrologist (Medical): No    Lack of Transportation (Non-Medical): No  Physical Activity: Not on file  Stress: Not on file  Social Connections: Not on file    Allergies:  Allergies  Allergen Reactions   Flagyl [Metronidazole] Hives   Latex Hives   Nickel Dermatitis    trigger   Penicillins Rash    Metabolic Disorder Labs: Lab Results  Component Value Date   HGBA1C 5.7 (H) 07/11/2021   No results found for: "PROLACTIN" No results found for: "CHOL", "TRIG", "HDL", "CHOLHDL", "VLDL", "LDLCALC" Lab Results  Component Value Date   TSH 0.803 07/11/2021   TSH 1.000 09/27/2017    Therapeutic Level Labs: No results found for: "LITHIUM" No results found for: "VALPROATE" No results found for: "CBMZ"  Current Medications: Current Outpatient Medications  Medication Sig Dispense Refill   ARIPiprazole (ABILIFY) 5 MG tablet Take 1 tablet (5 mg total) by mouth daily. 30 tablet 0   hydrOXYzine (ATARAX) 25 MG tablet Take 1 tablet (25 mg total) by mouth 4 (four) times daily. Take one tablet in the morning, take one tablet at noon, take 2 tablets at night 120 tablet 0   acetaminophen (TYLENOL) 500 MG tablet Take 2 tablets (1,000 mg total) by mouth every 8 (eight) hours as needed (pain). (Patient not taking: Reported on 02/15/2022) 60 tablet 0   Blood Pressure Monitoring DEVI 1 each by Does not apply route once a week. (Patient not taking: Reported on 02/15/2022) 1 each 0   ferrous sulfate 325 (65 FE) MG tablet Take 1 tablet (325 mg total) by mouth every other day. 30 tablet 5   ibuprofen (ADVIL) 600 MG tablet Take 1 tablet (600 mg total) by mouth every 6 (six)  hours as needed (pain). (Patient not taking: Reported on 02/15/2022) 40 tablet 0   Prenatal Vit-Fe Fumarate-FA (PREPLUS) 27-1 MG TABS Take 1 tablet by mouth daily. 30 tablet 13   sertraline (ZOLOFT) 100 MG tablet Take 1.5 tablets (150 mg total) by mouth daily. 45 tablet 0   No current facility-administered medications for this visit.     Musculoskeletal: Strength & Muscle Tone: within normal limits Gait & Station: normal Patient leans: N/A  Psychiatric Specialty Exam: Review of Systems  Respiratory:  Negative for shortness of breath.   Cardiovascular:  Negative for chest pain.  Gastrointestinal:  Negative for abdominal pain, constipation, diarrhea, nausea and vomiting.  Neurological:  Negative for dizziness, weakness and headaches.  Psychiatric/Behavioral:  Positive for dysphoric mood and sleep disturbance. Negative for hallucinations, self-injury and suicidal ideas. The patient is nervous/anxious.     Blood pressure 126/64, pulse 86, height '4\' 5"'$  (1.346 m), weight 195 lb (88.5 kg), SpO2 99 %, unknown if currently breastfeeding.Body mass index is 48.81 kg/m.  General Appearance: Casual and Fairly Groomed  Eye Contact:  Good  Speech:  Clear and Coherent and Normal Rate  Volume:  Normal  Mood:  Dysphoric  Affect:  Congruent  Thought Process:  Coherent and Goal Directed  Orientation:  Full (Time, Place, and Person)  Thought Content: Logical   Suicidal Thoughts:  No  Homicidal Thoughts:  No  Memory:  Immediate;   Good Recent;   Good  Judgement:  Good  Insight:  Good  Psychomotor Activity:  Normal  Concentration:  Concentration: Good and Attention Span: Good  Recall:  Good  Fund of Knowledge: Good  Language: Good  Akathisia:  Negative  Handed:  Right  AIMS (if indicated): done AIMS= 0   Assets:  Communication Skills Desire for Improvement Financial Resources/Insurance Housing Physical Health Resilience Social Support  ADL's:  Intact  Cognition: WNL  Sleep:  Fair    Screenings: GAD-7    Health and safety inspector 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/15/2022 in [facility] Routine Prenatal from 01/16/2022 in [facility] for [place] at [facility] Routine Prenatal from 01/09/2022 in [place] for [place] at [facility] Routine Prenatal from 12/30/2021 in [place] for [employer] at [facility] for Women Routine Prenatal from 12/14/2021 in [place] for [place] at [facility] for Women  Total GAD-7 Score '10 3 6 4 5      '$ PHQ2-9    Flowsheet Row Routine Prenatal from 01/16/2022 in [facility] for [place] at [facility] Routine Prenatal from 01/09/2022 in [facility] for [place] at [facility] Routine Prenatal from 12/30/2021 in [place] for [place] at [facility] for Women Routine Prenatal from 12/14/2021 in [place] for [place] at [facility] Routine Prenatal from 11/02/2021 in [place] for [place] at [facility] for Women  PHQ-2 Total Score 0 '4 1 2 4  '$ PHQ-9 Total Score '3 6 6 5 18      '$ Flowsheet Row Admission (Discharged) from 01/21/2022 in [place] [facility] Counselor from 09/06/2021 in [place] No Risk No Risk        Assessment and Plan:  Shalayna has had significant worsening of her depression and anxiety and weight gain.  Due to this we will stop her Zyprexa and begin to augment with Abilify as this is more weight neutral.  We will also increase her Zoloft.  She will return to the office in 4 weeks.     MDD, Recurrent, Mild  PTSD: -Increase Zoloft to 150 mg daily -Continue Hydroxyzine 25 mg AM, 25 mg Noon, and 50 mg QHS -Stop Zyprexa -Start Abilify 5 mg daily for augmentation   Collaboration of Care: Case Discussed with Supervising Attending Dr. Dwyane Dee   Patient/Guardian was advised Release of  Information must be obtained prior to any record release in order to collaborate their care with an outside provider. Patient/Guardian was advised if they have not already done so to contact the registration department  to sign all necessary forms in order for [place] to release information regarding their care.   Consent: Patient/Guardian gives verbal consent for treatment and assignment of benefits for services provided during this visit. Patient/Guardian expressed understanding and agreed to proceed.    Briant Cedar, MD 03/23/2022, 3:52 PM

## 2022-03-24 ENCOUNTER — Telehealth (HOSPITAL_COMMUNITY): Payer: Self-pay | Admitting: *Deleted

## 2022-03-24 NOTE — Telephone Encounter (Signed)
Abilify 5 mg PA was approved. Notified her pharmacy and they were able to run the rx thru.HW#[id number]

## 2022-03-24 NOTE — Telephone Encounter (Signed)
PA request sent from her pharmacy. Submitted request to [employer] [facility] and waiting on the determination.

## 2022-03-29 ENCOUNTER — Ambulatory Visit (INDEPENDENT_AMBULATORY_CARE_PROVIDER_SITE_OTHER): Payer: Medicaid Other | Admitting: Licensed Clinical Social Worker

## 2022-03-29 DIAGNOSIS — F411 Generalized anxiety disorder: Secondary | ICD-10-CM | POA: Diagnosis not present

## 2022-03-29 NOTE — Progress Notes (Signed)
   THERAPIST PROGRESS NOTE  Session Time: 45 minutes  Participation Level: Active  Behavioral Response: CasualAlertAnxious  Type of Therapy: Individual Therapy  Treatment Goals addressed: anxiety  ProgressTowards Goals: Progressing  Interventions: Strength-based and Supportive  Summary: Holly Vazquez is a 41 y.o. female who presents for f/u with this cln. She arrives on time and maintains good eye contact throughout. She reports current stressors are that her mother is currently hospitalized due to an infection, her electricity being cut off, and not having a stable routine. She reports she has been drinking more alcohol than usual and recently had a med man appointment wherein her Zoloft was increased and she was prescribed Abilify. She states she has not picked up the prescriptions yet due to navigating significant financial stress but plans to get them today. She reports struggling with remembering to take medication each day. She states she is driving her ex to and from work and that he stays in her home 5 nights a week to make it easier, and that he is also contributing financially. Pt states she feels safe with this arrangement and is receptive to feedback from cln regarding setting boundaries. She states her children will be starting daycare soon and that may help with a routine, as they will be at daycare from 8:30 am- 5:00 pm. She states she would like to discuss ways to have a more regular routine. 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 She reports some poor sleep. She denies food insecurity at this time and states she will be able to get her electricity turned back on soon. She is receptive to feedback from cln and Patent attorney for care.  Suicidal/Homicidal: Nowithout intent/plan  Therapist Response: Cln assessed for current stressors, symptoms, and safety since last session. Cln utilized active listening and validation  to assist with processing. Cln praised pt for doing everything she can and wrote down tips for remembering to take meds, such as writing with a dry erase marker on a mirror and setting phone alarms. Cln emphasized the importance of taking meds every day in order for them to be effective and stated that is a way to build routine. Cln reminded pt to set and work to maintain boundaries with ex should the current arrangement become problematic. Cln also provided sleep hygiene handout to pt. Cln scheduled f/u for August, as pt has two July appts scheduled, and confirmed preferred method of service delivery (in-person).   Plan: Return again in 3 weeks.  Diagnosis: GAD (generalized anxiety disorder)  Collaboration of Care: Other none required for this encounter  Patient/Guardian was advised Release of Information must be obtained prior to any record release in order to collaborate their care with an outside provider. Patient/Guardian was advised if they have not already done so to contact the registration department to sign all necessary forms in order for [place] to release information regarding their care.   Consent: Patient/Guardian gives verbal consent for treatment and assignment of benefits for services provided during this visit. Patient/Guardian expressed understanding and agreed to proceed.   Heron Nay, LCSWA 03/29/2022

## 2022-04-17 ENCOUNTER — Ambulatory Visit (INDEPENDENT_AMBULATORY_CARE_PROVIDER_SITE_OTHER): Payer: Medicaid Other | Admitting: Licensed Clinical Social Worker

## 2022-04-17 DIAGNOSIS — F411 Generalized anxiety disorder: Secondary | ICD-10-CM | POA: Diagnosis not present

## 2022-04-17 NOTE — Progress Notes (Signed)
   THERAPIST PROGRESS NOTE  Session Time: 45 minutes  Participation Level: Active  Behavioral Response: CasualAlertEuthymic  Type of Therapy: Individual Therapy  Treatment Goals addressed: anxiety, grief  ProgressTowards Goals: Progressing  Interventions: CBT and Supportive  Summary: Holly Vazquez is a 41 y.o. female who presents for f/u with this cln. She arrives on time and maintains good eye contact throughout the session. She reports her mother died on 10-Apr-2023 and states she feels guilty for not seeing her as much. She reports due to her mother's illness, it was difficult to visit her often. She reports she was able to see her and say goodbye prior to her passing. She states her brother has been handling the cremation and arrangements, which has provided pt with relief. She reports increased THC use and states she may be attempting to avoid grief until she is more stable. She states she has been taking her medications more regularly and notes she has noticed improved mood. 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 She states her sleep has improved and shares that her children's father should be able to buy a car within the next couple of months and is hopeful that she will be able to have a more consistent schedule at that time. She reports decreased appetite, stating she typically eats one meal per day and a snack. She states her children have enough food and that her son will be helping her will rent and other bills soon. She expresses the desire to start exercising again. She is receptive to feedback from cln.  Suicidal/Homicidal: Nowithout intent/plan  Therapist Response: Cln assessed for current stressors, symptoms, and safety since last session. Cln utilized active listening and  validation to assist with processing. Cln verbalized strengths and normalized anxiety and depression symptoms in light of significant stressors, such as grief and financial strain, and praised pt for the efforts she has been making. Cln informed her that strong feelings are highly likely to surface and described this as normal in the grieving process. Cln recommended pt try to eat more and exercise and informed her of [employer] [facility]'s daily lunch. Cln scheduled follow-up appointments and confirmed pt's availability and preferred method of service delivery (in-person).  Plan: Return again in 1 weeks.  Diagnosis: GAD (generalized anxiety disorder)  Collaboration of Care: Community Stakeholder(s) AEB recommended GUM  Patient/Guardian was advised Release of Information must be obtained prior to any record release in order to collaborate their care with an outside provider. Patient/Guardian was advised if they have not already done so to contact the registration department to sign all necessary forms in order for [place] to release information regarding their care.   Consent: Patient/Guardian gives verbal consent for treatment and assignment of benefits for services provided during this visit. Patient/Guardian expressed understanding and agreed to proceed.   Heron Nay, LCSWA 04/17/2022

## 2022-04-19 ENCOUNTER — Encounter (HOSPITAL_COMMUNITY): Payer: Self-pay

## 2022-04-20 ENCOUNTER — Encounter (HOSPITAL_COMMUNITY): Payer: Medicaid Other | Admitting: Student in an Organized Health Care Education/Training Program

## 2022-05-01 ENCOUNTER — Ambulatory Visit (INDEPENDENT_AMBULATORY_CARE_PROVIDER_SITE_OTHER): Payer: Medicaid Other | Admitting: Licensed Clinical Social Worker

## 2022-05-01 DIAGNOSIS — F411 Generalized anxiety disorder: Secondary | ICD-10-CM | POA: Diagnosis not present

## 2022-05-01 NOTE — Progress Notes (Signed)
   THERAPIST PROGRESS NOTE  Session Time: 48 minutes  Participation Level: Active  Behavioral Response: CasualAlertEuthymic  Type of Therapy: Individual Therapy  Treatment Goals addressed: anxiety  ProgressTowards Goals: Progressing  Interventions: CBT and Strength-based  Summary: Holly Vazquez is a 41 y.o. female who presents for f/u with this cln. She arrives on time and maintains good eye contact throughout the session. She states she has been feeling lonely and has experienced more feelings of grief related to her mother's death. 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 She reports she has difficulty communicating in general, specifically communicating requests and needs. She states she may "ask around" instead of asking directly and will often not ask at all due to assuming she knows what the response will be. She recognizes this is unhelpful. She shares an expression her father taught her which states if a man does something for you, he should be paid. Pt interprets this as always needing to offer something in return, which largely informs her hesitation to ask for things. She also shares how she has been considering approaching a Paramedic about working for her but is hesitant to do so due to this person previously mentoring her and refusing to do so while she was experiencing homelessness. 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 She reports she just got caught up on her rent and that her landlord was kind and encouraging. She states she struggles with taking things personally, and this also impacts assertive communication. She is receptive to feedback from cln.  Suicidal/Homicidal: Nowithout intent/plan  Therapist Response:  Cln assessed for current stressors, symptoms, and safety since last session. Cln utilized active listening and validation to assist with processing. Cln verbalized pt's strengths and encouraged her to practice assertive communication when possible. Cln recommended pt watch Ted Talk called "How to Not Take Things Personally" by Spero Curb, and shared cln has found this helpful professionally and personally. Cln normalized training one's brain as a process that takes a lot of practice and praised pt for her efforts. Cln reframed pt's financial difficulties as showing pt her strengths due to her persevering through the pandemic and continuing to put in effort. Cln confirmed next scheduled appts.  Plan: Return again in 3 weeks.  Diagnosis: GAD (generalized anxiety disorder)  Collaboration of Care: Medication Management AEB pt shared she needs appts with Dr. Kai Levins and cln advised her to make them before she leaves  Patient/Guardian was advised Release of Information must be obtained prior to any record release in order to collaborate their care with an outside provider. Patient/Guardian was advised if they have not already done so to contact the registration department to sign all necessary forms in order for [place] to release information regarding their care.   Consent: Patient/Guardian gives verbal consent for treatment and assignment of benefits for services provided during this visit. Patient/Guardian expressed understanding and agreed to proceed.   Heron Nay, LCSWA 05/01/2022

## 2022-05-08 ENCOUNTER — Encounter (HOSPITAL_COMMUNITY): Payer: Medicaid Other | Admitting: Student in an Organized Health Care Education/Training Program

## 2022-05-16 ENCOUNTER — Ambulatory Visit (INDEPENDENT_AMBULATORY_CARE_PROVIDER_SITE_OTHER): Payer: Medicaid Other | Admitting: Student in an Organized Health Care Education/Training Program

## 2022-05-16 ENCOUNTER — Encounter (HOSPITAL_COMMUNITY): Payer: Self-pay | Admitting: Student in an Organized Health Care Education/Training Program

## 2022-05-16 VITALS — BP 138/91 | HR 70 | Ht 64.5 in | Wt 186.8 lb

## 2022-05-16 DIAGNOSIS — F4310 Post-traumatic stress disorder, unspecified: Secondary | ICD-10-CM | POA: Diagnosis not present

## 2022-05-16 DIAGNOSIS — F419 Anxiety disorder, unspecified: Secondary | ICD-10-CM

## 2022-05-16 DIAGNOSIS — F3281 Premenstrual dysphoric disorder: Secondary | ICD-10-CM | POA: Diagnosis not present

## 2022-05-16 DIAGNOSIS — F331 Major depressive disorder, recurrent, moderate: Secondary | ICD-10-CM | POA: Diagnosis not present

## 2022-05-16 MED ORDER — ARIPIPRAZOLE 15 MG PO TABS: 7.5000 mg | ORAL_TABLET | Freq: Every day | ORAL | 0 refills | Status: DC

## 2022-05-16 MED ORDER — SERTRALINE HCL 100 MG PO TABS
200.0000 mg | ORAL_TABLET | Freq: Every day | ORAL | 1 refills | Status: DC
Start: 2022-05-16 — End: 2022-06-20

## 2022-05-16 MED ORDER — HYDROXYZINE HCL 25 MG PO TABS: 25.0000 mg | ORAL_TABLET | Freq: Four times a day (QID) | ORAL | 0 refills | Status: DC | PRN

## 2022-05-16 NOTE — Progress Notes (Signed)
BH MD/PA/NP OP Progress Note  05/16/2022 12:12 PM Holly Vazquez  MRN:  [id number]  Chief Complaint: No chief complaint on file.  HPI:  Holly Vazquez is a 41 yr old female who presents for follow up and continued medication management.  PPHx is significant for MDD, Recurrent, Moderate, PMDD, Adjustment Disorder, Anxiety, and PTSD, with no hospitalizations or suicide attempts.  She reports she has been doing just okay since our last appointment.  She reports that her sleep has not been as good recently she is still getting about 5 hours a night but it is less than she was getting in not as restful.  She reports that her mood is still down and she has recently found herself becoming a bit more jumpy.  She states that if the door slams or something falls on the ground she will jump more than she has been.  She reports things have been especially tough as it is summer and her children are not at school.  She reports that she continues to have some financial issues but no acute worsening.  She reports that she has begun to have her period again and so her PMDD is worsening her symptoms.  She did report her appetite continues to do well and she did lose 13 pounds between appointments after switching off the Zyprexa and onto the Abilify.  Discussed that there were a few medication changes we could make at this time.  Discussed increasing her Abilify as she had been stable on 5 mg of Zyprexa which is not equivalent to the 5 mg of Abilify she had been on.  Also discussed increasing the Zoloft further.  She was agreeable to both of these.  She reports she has been getting benefit from therapy and encouraged her to continue attending.  She reports no SI, HI, or AVH.  She reports no issues with her medications.  She will return to the office in 4 weeks.    Visit Diagnosis:    ICD-10-CM   1. PTSD (post-traumatic stress disorder)  F43.10 hydrOXYzine (ATARAX) 25 MG tablet    2. MDD (major depressive  disorder), recurrent episode, moderate (HCC)  F33.1 sertraline (ZOLOFT) 100 MG tablet    ARIPiprazole (ABILIFY) 15 MG tablet    3. Anxiety  F41.9 hydrOXYzine (ATARAX) 25 MG tablet    4. PMDD (premenstrual dysphoric disorder)  F32.81       Past Psychiatric History: MDD, Recurrent, Moderate, PMDD, Adjustment Disorder, Anxiety, and PTSD, with no hospitalizations or suicide attempts.  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   Anemia    due to heavy menses, on iron   Anxiety    Cervical polyp 03/20/2018   Removed 03-20-18   Contact dermatitis and other eczema, due to unspecified cause    Depression    Fibroids    Heart murmur    States always told it was from anemia   Restless legs syndrome (RLS)    Urinary tract infection, site not specified     Past Surgical History:  Procedure Laterality Date   WISDOM TOOTH EXTRACTION      Family Psychiatric History: Father - Depression, Anxiety, Polysubstance abuse (EtOH, Crack, PCP) Mother - Depression, Anxiety, Crack abuse Maternal Grandmother - Anxiety Adopted Cousin - Suicide at age 61  Family History:  Family History  Problem Relation Age of Onset   Hypertension Father  Heart disease Maternal Grandmother    Stroke Maternal Grandmother    Hypertension Maternal Grandmother    Eczema Son    Eczema Son    Eczema Son    Lung cancer Mother     Social History:  Social History   Socioeconomic History   Marital status: Married    Spouse name: Not on file   Number of children: Not on file   Years of education: Not on file   Highest education level: Not on file  Occupational History   Not on file  Tobacco Use   Smoking status: Never   Smokeless tobacco: Never  Vaping Use   Vaping Use: Never used  Substance and Sexual Activity   Alcohol use: No   Drug use: Yes    Types: Marijuana   Sexual activity: Yes     Birth control/protection: None    Comment: Husband - vasectomy  Other Topics Concern   Not on file  Social History Narrative   Not on file   Social Determinants of Health   Financial Resource Strain: Not on file  Food Insecurity: Food Insecurity Present (01/16/2022)   Hunger Vital Sign    Worried About Running Out of Food in the Last Year: Sometimes true    Ran Out of Food in the Last Year: Sometimes true  Transportation Needs: No Transportation Needs (01/16/2022)   PRAPARE - Hydrologist (Medical): No    Lack of Transportation (Non-Medical): No  Physical Activity: Not on file  Stress: Not on file  Social Connections: Not on file    Allergies:  Allergies  Allergen Reactions   Flagyl [Metronidazole] Hives   Latex Hives   Nickel Dermatitis    trigger   Penicillins Rash    Metabolic Disorder Labs: Lab Results  Component Value Date   HGBA1C 5.7 (H) 07/11/2021   No results found for: "PROLACTIN" No results found for: "CHOL", "TRIG", "HDL", "CHOLHDL", "VLDL", "LDLCALC" Lab Results  Component Value Date   TSH 0.803 07/11/2021   TSH 1.000 09/27/2017    Therapeutic Level Labs: No results found for: "LITHIUM" No results found for: "VALPROATE" No results found for: "CBMZ"  Current Medications: Current Outpatient Medications  Medication Sig Dispense Refill   hydrOXYzine (ATARAX) 25 MG tablet Take 1 tablet (25 mg total) by mouth every 6 (six) hours as needed. 140 tablet 0   acetaminophen (TYLENOL) 500 MG tablet Take 2 tablets (1,000 mg total) by mouth every 8 (eight) hours as needed (pain). (Patient not taking: Reported on 02/15/2022) 60 tablet 0   ARIPiprazole (ABILIFY) 15 MG tablet Take 0.5 tablets (7.5 mg total) by mouth daily. 30 tablet 0   Blood Pressure Monitoring DEVI 1 each by Does not apply route once a week. (Patient not taking: Reported on 02/15/2022) 1 each 0   ferrous sulfate 325 (65 FE) MG tablet Take 1 tablet (325 mg total) by mouth  every other day. 30 tablet 5   ibuprofen (ADVIL) 600 MG tablet Take 1 tablet (600 mg total) by mouth every 6 (six) hours as needed (pain). (Patient not taking: Reported on 02/15/2022) 40 tablet 0   Prenatal Vit-Fe Fumarate-FA (PREPLUS) 27-1 MG TABS Take 1 tablet by mouth daily. 30 tablet 13   sertraline (ZOLOFT) 100 MG tablet Take 2 tablets (200 mg total) by mouth daily. 60 tablet 1   No current facility-administered medications for this visit.     Musculoskeletal: Strength & Muscle Tone: within normal limits Gait &  Station: normal Patient leans: N/A  Psychiatric Specialty Exam: Review of Systems  Respiratory:  Negative for shortness of breath.   Cardiovascular:  Negative for chest pain.  Gastrointestinal:  Negative for abdominal pain, constipation, diarrhea, nausea and vomiting.  Neurological:  Negative for dizziness, weakness and headaches.  Psychiatric/Behavioral:  Positive for dysphoric mood and sleep disturbance. Negative for hallucinations, self-injury and suicidal ideas. The patient is not nervous/anxious.     Blood pressure (!) 138/91, pulse 70, height 5' 4.5" (1.638 m), weight 186 lb 12.8 oz (84.7 kg), SpO2 98 %, unknown if currently breastfeeding.Body mass index is 31.57 kg/m.  General Appearance: Casual and Fairly Groomed  Eye Contact:  Good  Speech:  Clear and Coherent and Normal Rate  Volume:  Normal  Mood:  Dysphoric  Affect:  Appropriate and Congruent  Thought Process:  Coherent and Goal Directed  Orientation:  Full (Time, Place, and Person)  Thought Content: Logical   Suicidal Thoughts:  No  Homicidal Thoughts:  No  Memory:  Immediate;   Fair Recent;   Fair  Judgement:  Good  Insight:  Good  Psychomotor Activity:  Normal  Concentration:  Concentration: Good and Attention Span: Good  Recall:  Good  Fund of Knowledge: Good  Language: Good  Akathisia:  Negative  Handed:  Right  AIMS (if indicated): done  Assets:  Communication Skills Desire for  Improvement Housing Resilience Social Support  ADL's:  Intact  Cognition: WNL  Sleep:   fair but not restful   Screenings: GAD-7    Health and safety inspector from 03/15/2022 in [facility] Routine Prenatal from 01/16/2022 in [place] for [place] [facility] for Women Routine Prenatal from 01/09/2022 in [place] for [place] at [facility] for Women Routine Prenatal from 12/30/2021 in [place] for [place] at [facility] for Women Routine Prenatal from 12/14/2021 in [place] for [place] at [facility] for Women  Total GAD-7 Score '10 3 6 4 5      '$ PHQ2-9    Flowsheet Row Routine Prenatal from 01/16/2022 in [facility] for [place] at [facility] for Women Routine Prenatal from 01/09/2022 in [facility] [place] at [facility] for Women Routine Prenatal from 12/30/2021 in [place] for [place] at [facility] for Women Routine Prenatal from 12/14/2021 in [place] for [employer] at [facility] for Women Routine Prenatal from 11/02/2021 in [place] for [place] at [facility] for Women  PHQ-2 Total Score 0 '4 1 2 4  '$ PHQ-9 Total Score '3 6 6 5 18      '$ Flowsheet Row Admission (Discharged) from 01/21/2022 in [place] 4S Mother Baby Unit Counselor from 09/06/2021 in [place] No Risk No Risk        Assessment and Plan:  Holly Vazquez is a 41 yr old female who presents for follow up and continued medication management.  PPHx is significant for MDD, Recurrent, Moderate, PMDD, Adjustment Disorder, Anxiety, and PTSD, with no hospitalizations or suicide attempts.   Ifeoluwa has had some worsening of her symptoms with the return of her menstrual cycle given her PMDD.  Given this we will further increase her Abilify and her Zoloft.  She has done well from a side effect  standpoint switching to Abilify from Zyprexa as she has lost 13 pounds.  She will return to the office in 4 weeks for follow-up.    MDD,  Recurrent, Mild  PTSD  PMDD: -Increase Zoloft to 200 mg daily.  60 (100 mg) tablets -Continue Hydroxyzine 25 mg AM, 25 mg Noon, and 50 mg QHS -Increase Abilify to 7.5 mg daily for augmentation   Collaboration of Care:   Patient/Guardian was advised Release of Information must be obtained prior to any record release in order to collaborate their care with an outside provider. Patient/Guardian was advised if they have not already done so to contact the registration department to sign all necessary forms in order for [place] to release information regarding their care.   Consent: Patient/Guardian gives verbal consent for treatment and assignment of benefits for services provided during this visit. Patient/Guardian expressed understanding and agreed to proceed.    Briant Cedar, MD 05/16/2022, 12:12 PM

## 2022-05-24 ENCOUNTER — Ambulatory Visit (HOSPITAL_COMMUNITY): Payer: Medicaid Other | Admitting: Licensed Clinical Social Worker

## 2022-05-24 ENCOUNTER — Telehealth (HOSPITAL_COMMUNITY): Payer: Self-pay | Admitting: Licensed Clinical Social Worker

## 2022-05-24 NOTE — Telephone Encounter (Signed)
See call intake 

## 2022-06-20 ENCOUNTER — Ambulatory Visit (INDEPENDENT_AMBULATORY_CARE_PROVIDER_SITE_OTHER): Payer: Medicaid Other | Admitting: Student in an Organized Health Care Education/Training Program

## 2022-06-20 ENCOUNTER — Encounter (HOSPITAL_COMMUNITY): Payer: Self-pay | Admitting: Student in an Organized Health Care Education/Training Program

## 2022-06-20 VITALS — BP 120/71 | HR 83 | Ht 64.5 in | Wt 195.0 lb

## 2022-06-20 DIAGNOSIS — F419 Anxiety disorder, unspecified: Secondary | ICD-10-CM

## 2022-06-20 DIAGNOSIS — F331 Major depressive disorder, recurrent, moderate: Secondary | ICD-10-CM

## 2022-06-20 DIAGNOSIS — F4310 Post-traumatic stress disorder, unspecified: Secondary | ICD-10-CM

## 2022-06-20 DIAGNOSIS — F3281 Premenstrual dysphoric disorder: Secondary | ICD-10-CM

## 2022-06-20 MED ORDER — SERTRALINE HCL 100 MG PO TABS: 200.0000 mg | ORAL_TABLET | Freq: Every day | ORAL | 1 refills | Status: DC

## 2022-06-20 MED ORDER — QUETIAPINE FUMARATE 100 MG PO TABS: 100.0000 mg | ORAL_TABLET | Freq: Every day | ORAL | 1 refills | Status: DC

## 2022-06-20 MED ORDER — HYDROXYZINE HCL 25 MG PO TABS: 25.0000 mg | ORAL_TABLET | Freq: Four times a day (QID) | ORAL | 0 refills | Status: DC | PRN

## 2022-06-20 NOTE — Progress Notes (Signed)
BH MD/PA/NP OP Progress Note  06/20/2022 1:03 PM Holly Vazquez  MRN:  [id number]  Chief Complaint:  Chief Complaint  Patient presents with   Follow-up   Depression   HPI:  Holly Vazquez is a 41 yr old female who presents for follow up and medication management.  PPHx is significant for MDD, Recurrent, Moderate, PMDD, Adjustment Disorder, Anxiety, and PTSD, with no hospitalizations or suicide attempts.  She reports that she has not been doing that well since her last appointment.  She reports that her mother died on Father's Day this year and that this was the last person in her family who supported her.  She states that she has not had the time to grieve this loss and it has continued to weigh on her and affect her.  She reports that she will find herself start to cry at times and she does not know why.  She reports that her depression has worsened to where she is having trouble working which has put their current living space in jeopardy.  She reports her sleep has been poor.  And that this has been a significant drain on her.  Discussed that since the Abilify did not seem to be working and she was having such issues with sleep we could do a trial of Seroquel.  Discussed with her that this medication can help with sleep and also augment her Zoloft.  Discussed with her the importance of establishing a good sleep routine for her will be a big step in improving her mood and anxiety overall.  Discussed with her that it still has the risk of increased weight gain as do all antipsychotics.  She reported understanding and was agreeable to a trial.  Discussed continuing with therapy as the therapist would be able to help her grieve.  Discussed setting aside a few minutes a couple times a week to allow herself to do some self reflection and express those emotions she has so that they do not overflow as they have been.  She reports she will attempt to do this.  She reports no SI, HI, or AVH.  She  reports her appetite is fair.  She reports her sleep is poor.  She reports no other concerns at present.    Visit Diagnosis:    ICD-10-CM   1. MDD (major depressive disorder), recurrent episode, moderate (HCC)  F33.1 sertraline (ZOLOFT) 100 MG tablet    QUEtiapine (SEROQUEL) 100 MG tablet    2. PTSD (post-traumatic stress disorder)  F43.10 hydrOXYzine (ATARAX) 25 MG tablet    QUEtiapine (SEROQUEL) 100 MG tablet    3. Anxiety  F41.9 hydrOXYzine (ATARAX) 25 MG tablet    4. PMDD (premenstrual dysphoric disorder)  F32.81       Past Psychiatric History: MDD, Recurrent, Moderate, PMDD, Adjustment Disorder, Anxiety, and PTSD, with no hospitalizations or suicide attempts.  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   Anemia    due to heavy menses, on iron   Anxiety    Cervical polyp 03/20/2018   Removed 03-20-18   Contact dermatitis and other eczema, due to unspecified cause    Depression    Fibroids    Heart murmur    States always told it was from anemia   Restless legs syndrome (RLS)    Urinary tract infection, site not specified     Past Surgical History:  Procedure  Laterality Date   WISDOM TOOTH EXTRACTION      Family Psychiatric History: Father - Depression, Anxiety, Polysubstance abuse (EtOH, Crack, PCP) Mother - Depression, Anxiety, Crack abuse Maternal Grandmother - Anxiety Adopted Cousin - Suicide at age 44  Family History:  Family History  Problem Relation Age of Onset   Hypertension Father    Heart disease Maternal Grandmother    Stroke Maternal Grandmother    Hypertension Maternal Grandmother    Eczema Son    Eczema Son    Eczema Son    Lung cancer Mother     Social History:  Social History   Socioeconomic History   Marital status: Married    Spouse name: Not on file   Number of children: Not on file   Years of education: Not on file    Highest education level: Not on file  Occupational History   Not on file  Tobacco Use   Smoking status: Never   Smokeless tobacco: Never  Vaping Use   Vaping Use: Never used  Substance and Sexual Activity   Alcohol use: No   Drug use: Yes    Types: Marijuana   Sexual activity: Yes    Birth control/protection: None    Comment: Husband - vasectomy  Other Topics Concern   Not on file  Social History Narrative   Not on file   Social Determinants of Health   Financial Resource Strain: Not on file  Food Insecurity: Food Insecurity Present (01/16/2022)   Hunger Vital Sign    Worried About Running Out of Food in the Last Year: Sometimes true    Ran Out of Food in the Last Year: Sometimes true  Transportation Needs: No Transportation Needs (01/16/2022)   PRAPARE - Hydrologist (Medical): No    Lack of Transportation (Non-Medical): No  Physical Activity: Not on file  Stress: Not on file  Social Connections: Not on file    Allergies:  Allergies  Allergen Reactions   Flagyl [Metronidazole] Hives   Latex Hives   Nickel Dermatitis    trigger   Penicillins Rash    Metabolic Disorder Labs: Lab Results  Component Value Date   HGBA1C 5.7 (H) 07/11/2021   No results found for: "PROLACTIN" No results found for: "CHOL", "TRIG", "HDL", "CHOLHDL", "VLDL", "LDLCALC" Lab Results  Component Value Date   TSH 0.803 07/11/2021   TSH 1.000 09/27/2017    Therapeutic Level Labs: No results found for: "LITHIUM" No results found for: "VALPROATE" No results found for: "CBMZ"  Current Medications: Current Outpatient Medications  Medication Sig Dispense Refill   acetaminophen (TYLENOL) 500 MG tablet Take 2 tablets (1,000 mg total) by mouth every 8 (eight) hours as needed (pain). 60 tablet 0   Blood Pressure Monitoring DEVI 1 each by Does not apply route once a week. 1 each 0   ferrous sulfate 325 (65 FE) MG tablet Take 1 tablet (325 mg total) by mouth every  other day. 30 tablet 5   hydrOXYzine (ATARAX) 25 MG tablet Take 1 tablet (25 mg total) by mouth every 6 (six) hours as needed. 140 tablet 0   ibuprofen (ADVIL) 600 MG tablet Take 1 tablet (600 mg total) by mouth every 6 (six) hours as needed (pain). 40 tablet 0   Prenatal Vit-Fe Fumarate-FA (PREPLUS) 27-1 MG TABS Take 1 tablet by mouth daily. 30 tablet 13   QUEtiapine (SEROQUEL) 100 MG tablet Take 1 tablet (100 mg total) by mouth at bedtime. 30 tablet  1   sertraline (ZOLOFT) 100 MG tablet Take 2 tablets (200 mg total) by mouth daily. 60 tablet 1   No current facility-administered medications for this visit.     Musculoskeletal: Strength & Muscle Tone: within normal limits Gait & Station: normal Patient leans: N/A  Psychiatric Specialty Exam: Review of Systems  Respiratory:  Negative for shortness of breath.   Cardiovascular:  Negative for chest pain.  Gastrointestinal:  Negative for abdominal pain, constipation, diarrhea, nausea and vomiting.  Neurological:  Negative for dizziness, weakness and headaches.  Psychiatric/Behavioral:  Positive for dysphoric mood and sleep disturbance. Negative for agitation, hallucinations, self-injury and suicidal ideas. The patient is not nervous/anxious.     Blood pressure 120/71, pulse 83, height 5' 4.5" (1.638 m), weight 195 lb (88.5 kg), SpO2 99 %, unknown if currently breastfeeding.Body mass index is 32.95 kg/m.  General Appearance: Casual and Fairly Groomed  Eye Contact:  Good  Speech:  Clear and Coherent and Normal Rate  Volume:  Normal  Mood:  Depressed  Affect:  Appropriate and Congruent  Thought Process:  Coherent and Goal Directed  Orientation:  Full (Time, Place, and Person)  Thought Content: Logical   Suicidal Thoughts:  No  Homicidal Thoughts:  No  Memory:  Immediate;   Good Recent;   Good  Judgement:  Good  Insight:  Good  Psychomotor Activity:  Normal  Concentration:  Concentration: Good and Attention Span: Good  Recall:  Good   Fund of Knowledge: Good  Language: Good  Akathisia:  Negative  Handed:  Right  AIMS (if indicated): done AIMS=0  Assets:  Communication Skills Desire for Improvement Housing Physical Health Resilience  ADL's:  Intact  Cognition: WNL  Sleep:  Poor   Screenings: GAD-7    Health and safety inspector from 03/15/2022 in [facility] Routine Prenatal from 01/16/2022 in [place] for [place] at [facility] for Women Routine Prenatal from 01/09/2022 in [place] for [place] at [facility] for Women Routine Prenatal from 12/30/2021 in [place] for [place] at [facility] for Women Routine Prenatal from 12/14/2021 in [place] for [place] at [facility] for Women  Total GAD-7 Score '10 3 6 4 5      '$ PHQ2-9    Flowsheet Row Routine Prenatal from 01/16/2022 in [place] for [place] at [facility] for Women Routine Prenatal from 01/09/2022 in [place] for [place] at [facility] for Women Routine Prenatal from 12/30/2021 in [place] for [place] at [facility] for Women Routine Prenatal from 12/14/2021 in [place] for [place] at [facility] for Women Routine Prenatal from 11/02/2021 in [place] for [place] at [facility] for Women  PHQ-2 Total Score 0 '4 1 2 4  '$ PHQ-9 Total Score '3 6 6 5 18      '$ Flowsheet Row Admission (Discharged) from 01/21/2022 in [place] 4S Mother Baby Unit Counselor from 09/06/2021 in [place] No Risk No Risk        Assessment and Plan:  Shama Monfils is a 41 yr old female who presents for follow up and medication management.  PPHx is significant for MDD, Recurrent, Moderate, PMDD, Adjustment Disorder, Anxiety, and PTSD, with no hospitalizations or suicide attempts.   Ceniya has not had improvement with the increase in her Abilify.  Sleep  continues to be a big issue for her so because of this we will stop her Abilify  and start Seroquel at this time.  If her sleep can improve this should help lessen her depression and give her motivation to continue working.  She will also try to set aside time for reflection on her emotions and expressing them.  She will also continue with therapy given the significant grief she has over the death of her mother.  She will return to the office in approximately 4 weeks.   MDD, Recurrent, Mild  PTSD  PMDD: -Continue Zoloft 200 mg daily.  60 (100 mg) tablets with 1 refill -Continue Hydroxyzine 25 mg AM, 25 mg Noon, and 50 mg QHS for anxiety. 140 tablets with 0 refills. -Start Seroquel 100 mg QHS for augmentation and sleep.  30 tablets with 1 refill. -Stop Abilify   Collaboration of Care:   Patient/Guardian was advised Release of Information must be obtained prior to any record release in order to collaborate their care with an outside provider. Patient/Guardian was advised if they have not already done so to contact the registration department to sign all necessary forms in order for [place] to release information regarding their care.   Consent: Patient/Guardian gives verbal consent for treatment and assignment of benefits for services provided during this visit. Patient/Guardian expressed understanding and agreed to proceed.    Briant Cedar, MD 06/20/2022, 1:03 PM

## 2022-06-27 IMAGING — US US MFM OB FOLLOW-UP
1 series · 13 of 28 positions shown · non-contrast
Comparison: none

[Series 1: us mfm ob follow-up · 13 of 94 slices shown]
[im 4/94]
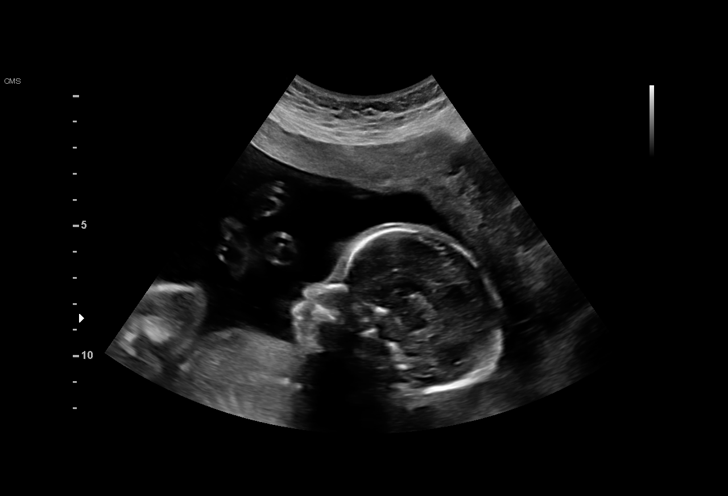
[im 11/94]
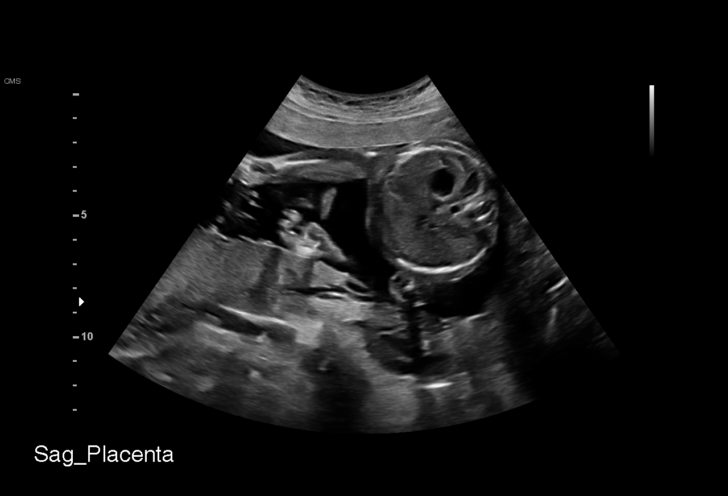
[im 18/94]
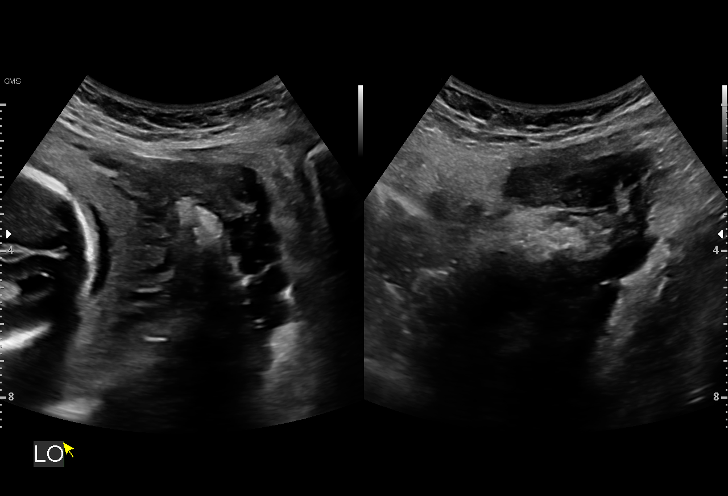
[im 25/94]
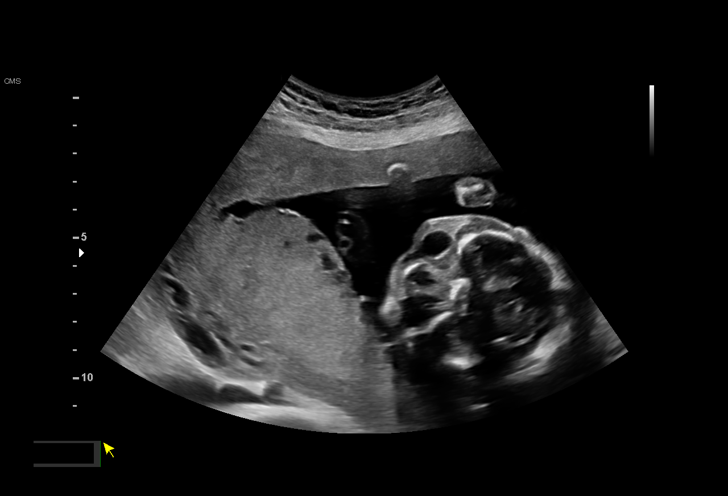
[im 32/94]
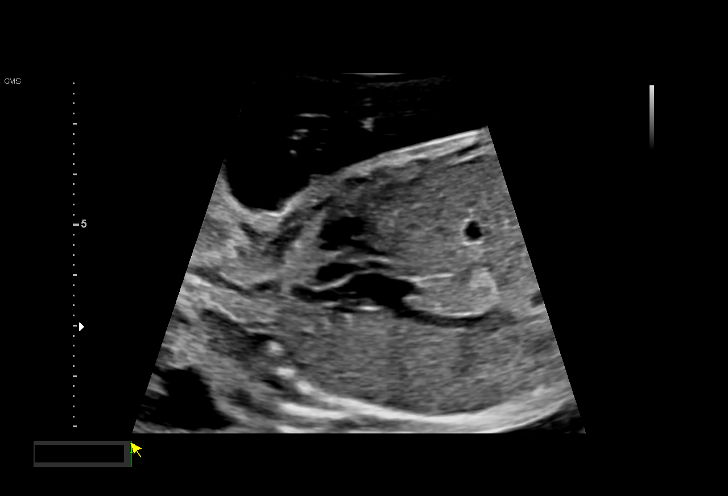
[im 38/94]
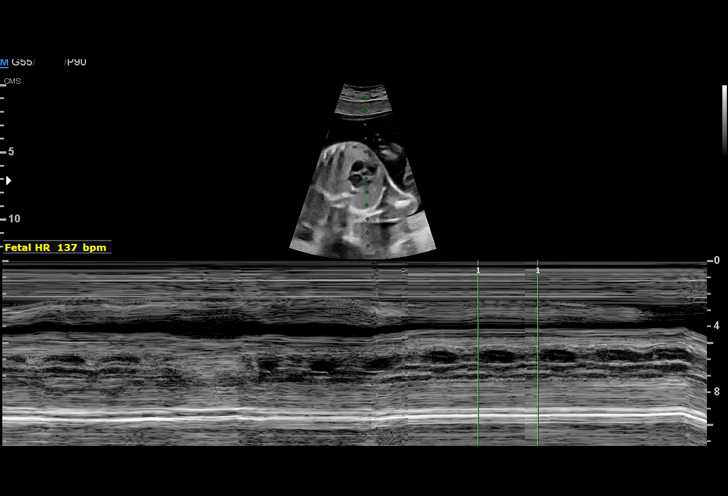
[im 49/94]
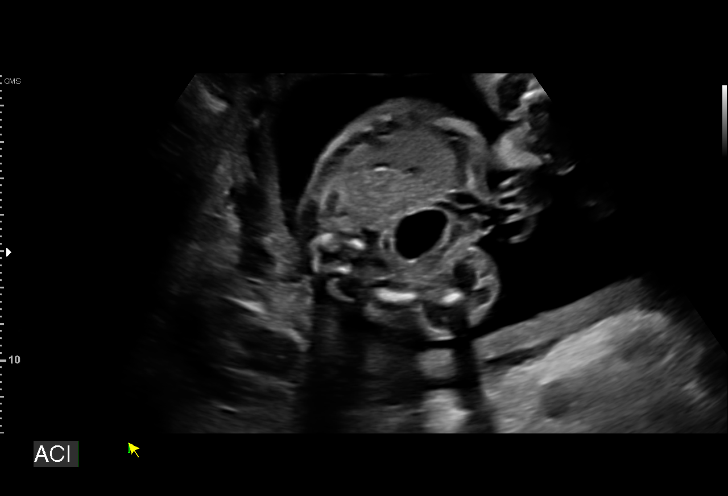
[im 56/94]
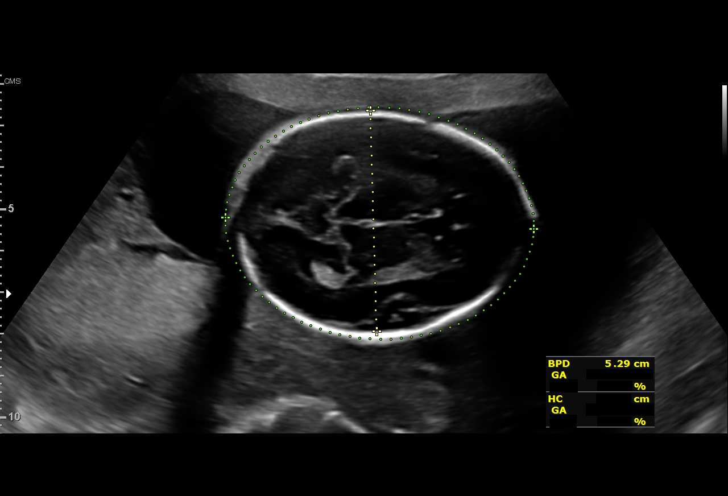
[im 63/94]
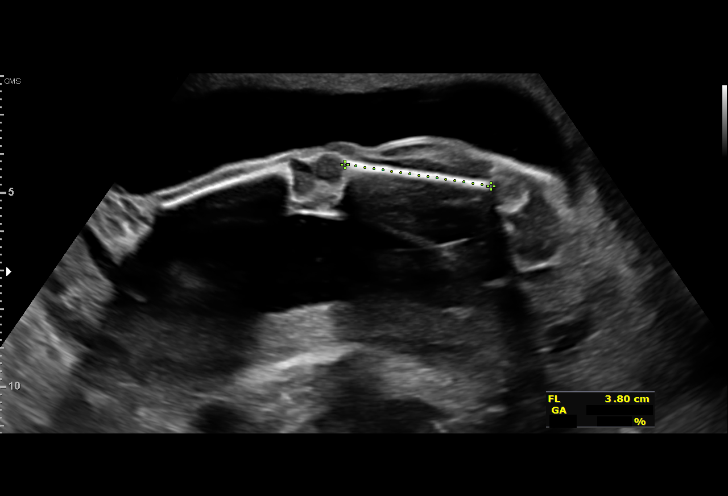
[im 69/94]
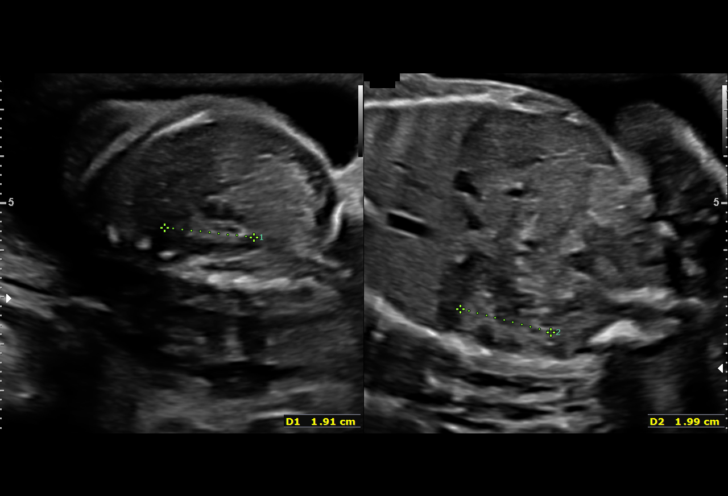
[im 76/94]
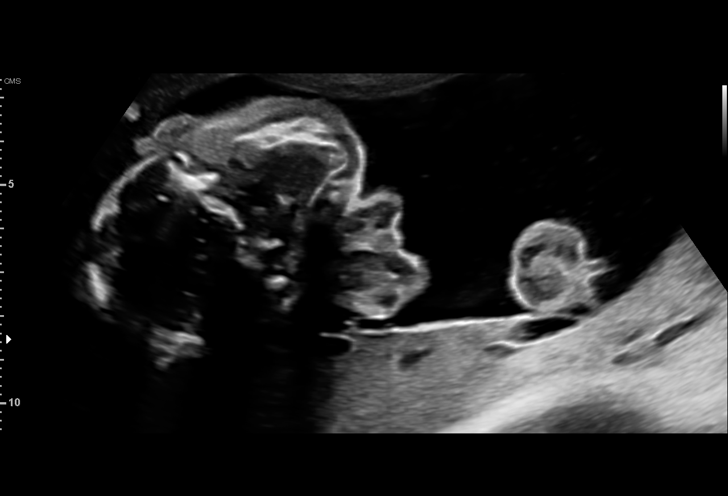
[im 83/94]
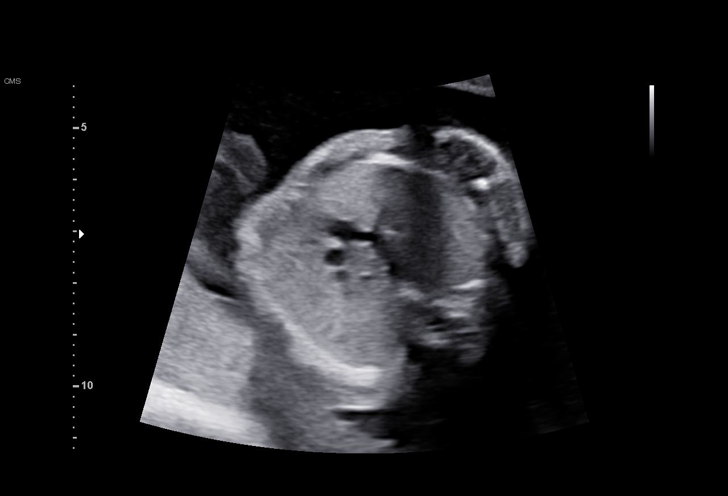
[im 90/94]
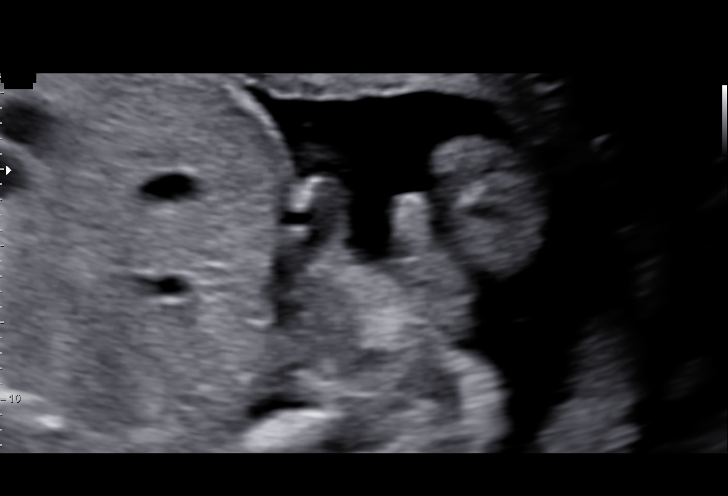

[13 of 28 positions shown; findings below may reference images not displayed]

Indications

 Advanced maternal age multigravida 35+,
 second trimester
 Grand multiparity, antepartum
 Uterine fibroids affecting pregnancy in        O34.12,
 second trimester, antepartum
 23 weeks gestation of pregnancy
 Genetic Anitra Myrick (a-/a-), SMA
 LR NIPS, Neg AFP
 Drug use complicating pregnancy, second
 trimester (THC)
 Antenatal follow-up for nonvisualized fetal
 anatomy
Fetal Evaluation

 Num Of Fetuses:         1
 Fetal Heart Rate(bpm):  137
 Cardiac Activity:       Observed
 Presentation:           Variable
 Placenta:               Posterior
 P. Cord Insertion:      Visualized

 Amniotic Fluid
 AFI FV:      Within normal limits

                             Largest Pocket(cm)
                             6
Biometry

 BPD:      53.9  mm     G. Age:  22w 3d         23  %    CI:        70.47   %    70 - 86
                                                         FL/HC:      18.6   %    19.2 -
 HC:      204.7  mm     G. Age:  22w 4d         20  %    HC/AC:      1.05        1.05 -
 AC:      194.6  mm     G. Age:  24w 1d         77  %    FL/BPD:     70.5   %    71 - 87
 FL:         38  mm     G. Age:  22w 1d         15  %    FL/AC:      19.5   %    20 - 24
 CER:      24.3  mm     G. Age:  22w 2d         57  %

 LV:        5.1  mm
 CM:          4  mm

 Est. FW:     569  gm      1 lb 4 oz     51  %
OB History

 Gravidity:    6         Term:   5
 Living:       5
Gestational Age

 LMP:           23w 0d        Date:  04/22/21                 EDD:   01/27/22
 U/S Today:     22w 6d                                        EDD:   01/28/22
 Best:          23w 0d     Det. By:  LMP  (04/22/21)          EDD:   01/27/22
Anatomy

 Cranium:               Appears normal         LVOT:                   Appears normal
 Cavum:                 Appears normal         Aortic Arch:            Appears normal
 Ventricles:            Appears normal         Ductal Arch:            Appears normal
 Choroid Plexus:        Appears normal         Diaphragm:              Appears normal
 Cerebellum:            Appears normal         Stomach:                Appears normal, left
                                                                       sided
 Posterior Fossa:       Appears normal         Abdomen:                Appears normal
 Nuchal Fold:           Previously seen        Abdominal Wall:         Appears nml (cord
                                                                       insert, abd wall)
 Face:                  Appears normal         Cord Vessels:           Appears normal (3
                        (orbits and profile)                           vessel cord)
 Lips:                  Appears normal         Kidneys:                Appear normal
 Palate:                Appears normal         Bladder:                Appears normal
 Thoracic:              Appears normal         Spine:                  Previously seen
 Heart:                 Appears normal         Upper Extremities:      Appears normal
                        (4CH, axis, and
                        situs)
 RVOT:                  Appears normal         Lower Extremities:      Previously seen

 Other:  Male gender previously visualized. Heels/feet and open hands/5th
         digits visualized. VC, 3VV and 3VTV visualized. Nasal bone and
         lenses visualized.
Cervix Uterus Adnexa

 Cervix
 Length:           3.74  cm.
 Normal appearance by transabdominal scan.
 Uterus
 Multiple fibroids noted, see table below.

 Right Ovary
 Within normal limits.

 Left Ovary
 Within normal limits.

 Cul De Sac
 No free fluid seen.

 Adnexa
 No abnormality visualized.
Myomas

 Site                     L(cm)      W(cm)      D(cm)       Location
 Anterior
 Anterior

 Blood Flow                  RI       PI       Comments

Comments

 This patient was seen for a follow up exam as the views of
 the fetal anatomy were unable to be fully visualized during
 her last exam.  Her pregnancy has also been complicated by
 advanced maternal age.  She denies any problems since her
 last exam.
 She was informed that the fetal growth and amniotic fluid
 level appears appropriate for her gestational age.
 The views of the fetal anatomy were visualized today.  There
 were no obvious anomalies noted.
 The limitations of ultrasound in the detection of all anomalies
 was discussed.
 Due to advanced maternal age and her fibroid uterus, a
 follow-up growth scan was scheduled in third trimester (at 32
 weeks).

## 2022-06-28 ENCOUNTER — Other Ambulatory Visit (HOSPITAL_COMMUNITY)
Admission: RE | Admit: 2022-06-28 | Discharge: 2022-06-28 | Disposition: A | Discharge status: Home / Self Care | Payer: Medicaid Other | Source: Ambulatory Visit | Attending: Family Medicine | Admitting: Family Medicine

## 2022-06-28 ENCOUNTER — Other Ambulatory Visit: Payer: Self-pay

## 2022-06-28 ENCOUNTER — Ambulatory Visit (INDEPENDENT_AMBULATORY_CARE_PROVIDER_SITE_OTHER): Payer: Medicaid Other

## 2022-06-28 VITALS — BP 128/76 | HR 69 | Wt 197.0 lb

## 2022-06-28 DIAGNOSIS — N898 Other specified noninflammatory disorders of vagina: Secondary | ICD-10-CM

## 2022-06-28 DIAGNOSIS — R399 Unspecified symptoms and signs involving the genitourinary system: Secondary | ICD-10-CM

## 2022-06-28 DIAGNOSIS — R300 Dysuria: Secondary | ICD-10-CM | POA: Diagnosis not present

## 2022-06-28 DIAGNOSIS — R8290 Unspecified abnormal findings in urine: Secondary | ICD-10-CM | POA: Diagnosis not present

## 2022-06-28 MED ORDER — NITROFURANTOIN MONOHYD MACRO 100 MG PO CAPS: 100.0000 mg | ORAL_CAPSULE | Freq: Two times a day (BID) | ORAL | 0 refills | Status: DC

## 2022-06-28 MED ORDER — FLUCONAZOLE 150 MG PO TABS: 150.0000 mg | ORAL_TABLET | Freq: Once | ORAL | 0 refills | Status: AC

## 2022-06-28 NOTE — Progress Notes (Signed)
Patient reports new sexual partner begin approx 2 weeks ago. Pt is concerned condoms are causing vaginal irritation. Pt plans to try non latex condoms and follow up if her symptoms continue. Pt does not desire pregnancy and is using condoms for birth control. Offered birth control consult if she would like to change birth control method. Pt reports thick vaginal discharge with itching and urinary symptoms; describes pain at end of urine stream and malodorous urine. UA shows nitrites and leukocytes present. Macrobid sent per protocol. Urine sent for culture. Diflucan sent per protocol for vaginal itching and self swab obtained. Will contact patient with any abnormal results and any need for change in medication.   Apolonio Schneiders RN 06/28/22

## 2022-06-28 NOTE — Patient Instructions (Signed)
Non-medication Remedies for Bacterial Vaginosis   Option #1   1 Tbsp Fractitionated Coconut Oil  10 drops of Melaleuca (Tea Tree) Oil   Mix ingredients together well.  Soak 3-4 tampons (in applicators) in that mixture until all or mostly all mixture is soaked up into the tampons.  Insert 1 saturated tampon vaginally and wear overnight for 3-4 nights.   You can purchase Tea Tree Oil locally at:   [employer]  [address] [address]   [place] [address]      Option #2  Fill tub with enough to cover lap/lower abdomen warm water.  Mix 1/2 cup of baking soda or a few cups of apple cider vinegar into water. Soak in bath for at least 20 minutes. Be sure to swish water in between legs to get as much in vagina as possible. This soak can be done after sexual intercourse and menstrual cycles.      Unscented is best! (and other things to remember) Soap: UNSCENTED Dove (white box light green writing)  Laundry detergent- unscented  Sanitary napkin/panty liners: UNSCENTED.  If it doesn't SAY unscented it can have a scent/perfume    NO PERFUMES OR LOTIONS in the vulvar area Condoms: Non dyed, non flavored.   Remember to change wash cloths and towels frequently to prevent reinfection! Wear loose clothes to sleep.

## 2022-06-29 ENCOUNTER — Encounter: Payer: Self-pay | Admitting: *Deleted

## 2022-06-29 ENCOUNTER — Telehealth (HOSPITAL_COMMUNITY): Payer: Self-pay | Admitting: *Deleted

## 2022-06-29 ENCOUNTER — Telehealth: Payer: Self-pay | Admitting: *Deleted

## 2022-06-29 ENCOUNTER — Other Ambulatory Visit: Payer: Self-pay | Admitting: Obstetrics and Gynecology

## 2022-06-29 ENCOUNTER — Telehealth (HOSPITAL_COMMUNITY): Payer: Self-pay | Admitting: Student in an Organized Health Care Education/Training Program

## 2022-06-29 DIAGNOSIS — B9689 Other specified bacterial agents as the cause of diseases classified elsewhere: Secondary | ICD-10-CM

## 2022-06-29 LAB — CERVICOVAGINAL ANCILLARY ONLY
Bacterial Vaginitis (gardnerella): POSITIVE — AB
Candida Glabrata: NEGATIVE
Candida Vaginitis: NEGATIVE
Chlamydia: NEGATIVE
Comment: NEGATIVE
Comment: NEGATIVE
Comment: NEGATIVE
Comment: NEGATIVE
Comment: NEGATIVE
Comment: NORMAL
Neisseria Gonorrhea: NEGATIVE
Trichomonas: NEGATIVE

## 2022-06-29 NOTE — Telephone Encounter (Signed)
I called the patient and left message calling with results and medication question and will send MyChart message- please read and respond or call if you cannot access MyChart. Staci Acosta

## 2022-06-29 NOTE — Telephone Encounter (Signed)
-----   Message from Radene Gunning, MD sent at 06/29/2022  1:16 PM EDT ----- Pt has BV but reports allergy to hives. Can you ask her if she has taken anything for bv in the past and then we can prescribe that?  Thanks, pad

## 2022-06-29 NOTE — Telephone Encounter (Signed)
Submitted PA for quetiapine yesterday for patient and this am fax received it was denied for not meeting criteria. 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 I will forward this outcome to her provider for further direction.

## 2022-06-29 NOTE — Telephone Encounter (Signed)
Received message that patient had not received her Seroquel.  Discovered that prior authorization had been denied.  Discussed this with staff and supporting documentation was faxed back to them for an appeal.  We will wait final result of the appeal.   Fatima Sanger MD Resident

## 2022-06-30 ENCOUNTER — Telehealth (HOSPITAL_COMMUNITY): Payer: Self-pay | Admitting: *Deleted

## 2022-06-30 MED ORDER — CLINDAMYCIN HCL 300 MG PO CAPS: 300.0000 mg | ORAL_CAPSULE | Freq: Two times a day (BID) | ORAL | 0 refills | Status: DC

## 2022-06-30 NOTE — Telephone Encounter (Signed)
Attempted to call patient. Left a voicemail asking pt to call back.

## 2022-06-30 NOTE — Telephone Encounter (Signed)
Initial PA denied, appeal done as a peer to peer.PA for Quetiapine accepted.It is effective for one year till 09/29/23. Auth # [id number]. Will notify pharmacy.

## 2022-06-30 NOTE — Addendum Note (Signed)
Addended by: Madalyn Rob D on: 06/30/2022 10:30 AM   Modules accepted: Orders

## 2022-07-01 LAB — URINE CULTURE

## 2022-07-04 ENCOUNTER — Telehealth (HOSPITAL_COMMUNITY): Payer: Self-pay | Admitting: *Deleted

## 2022-07-04 NOTE — Telephone Encounter (Signed)
Followed up with PA request for patients seroquel. It has been approved PA [id number]. Pharmacy notified.

## 2022-07-18 ENCOUNTER — Encounter (HOSPITAL_COMMUNITY): Payer: Medicaid Other | Admitting: Student in an Organized Health Care Education/Training Program

## 2022-07-27 ENCOUNTER — Other Ambulatory Visit: Payer: Self-pay | Admitting: *Deleted

## 2022-07-27 DIAGNOSIS — N898 Other specified noninflammatory disorders of vagina: Secondary | ICD-10-CM

## 2022-07-27 MED ORDER — FLUCONAZOLE 150 MG PO TABS: 150.0000 mg | ORAL_TABLET | Freq: Once | ORAL | 0 refills | Status: DC

## 2022-08-02 ENCOUNTER — Telehealth (HOSPITAL_COMMUNITY): Payer: Self-pay

## 2022-08-02 DIAGNOSIS — F4310 Post-traumatic stress disorder, unspecified: Secondary | ICD-10-CM

## 2022-08-02 DIAGNOSIS — F331 Major depressive disorder, recurrent, moderate: Secondary | ICD-10-CM

## 2022-08-03 ENCOUNTER — Other Ambulatory Visit: Payer: Self-pay | Admitting: Family Medicine

## 2022-08-03 MED ORDER — QUETIAPINE FUMARATE 100 MG PO TABS: 100.0000 mg | ORAL_TABLET | Freq: Every day | ORAL | 1 refills | Status: DC

## 2022-08-03 NOTE — Telephone Encounter (Signed)
Received message that patient needed refill of her Seroquel.  This was sent.    Sent: -Seroquel 100 mg QHS.  30 tablets with 1 refill.   Fatima Sanger MD Resident

## 2022-08-04 NOTE — Telephone Encounter (Signed)
Called pt after speaking with pharmacist @ [facility]. Pt was advised that [facility] states they delivered the fluconazole on 10/16 along with other meds. Pt maintains that she did receive her other prescriptions on that Holly Vazquez however did not receive fluconazole in the package.  She is requesting another prescription for fluconazole. She continues to have vaginal itching and discharge. I advised that I will send Rx for fluconazole.  She should notify [facility] immediately if not received.  She voiced understanding.

## 2022-09-01 ENCOUNTER — Encounter (HOSPITAL_COMMUNITY): Payer: Self-pay | Admitting: Student in an Organized Health Care Education/Training Program

## 2022-09-01 ENCOUNTER — Telehealth (INDEPENDENT_AMBULATORY_CARE_PROVIDER_SITE_OTHER): Payer: Medicaid Other | Admitting: Student in an Organized Health Care Education/Training Program

## 2022-09-01 DIAGNOSIS — F4310 Post-traumatic stress disorder, unspecified: Secondary | ICD-10-CM | POA: Diagnosis not present

## 2022-09-01 DIAGNOSIS — F419 Anxiety disorder, unspecified: Secondary | ICD-10-CM | POA: Diagnosis not present

## 2022-09-01 DIAGNOSIS — F331 Major depressive disorder, recurrent, moderate: Secondary | ICD-10-CM

## 2022-09-01 MED ORDER — VENLAFAXINE HCL ER 37.5 MG PO CP24: 37.5000 mg | ORAL_CAPSULE | Freq: Every day | ORAL | 1 refills | Status: DC

## 2022-09-01 MED ORDER — SERTRALINE HCL 100 MG PO TABS: 200.0000 mg | ORAL_TABLET | Freq: Every day | ORAL | 1 refills | Status: DC

## 2022-09-01 MED ORDER — HYDROXYZINE HCL 25 MG PO TABS: 25.0000 mg | ORAL_TABLET | Freq: Four times a day (QID) | ORAL | 1 refills | Status: DC | PRN

## 2022-09-01 NOTE — Progress Notes (Signed)
Virtual Visit via Video Note  I connected with Holly Vazquez on 09/01/22 at  2:30 PM EST by a video enabled telemedicine application and verified that I am speaking with the correct person using two identifiers.  Location: Patient: Home Provider: [facility]   I discussed the limitations of evaluation and management by telemedicine and the availability of in person appointments. The patient expressed understanding and agreed to proceed.  History of Present Illness:  Holly Vazquez is a 41 yr old female who presents for follow up and medication management.  PPHx is significant for MDD, Recurrent, Moderate, PMDD, Adjustment Disorder, Anxiety, and PTSD, with no hospitalizations or suicide attempts.   She reports things have not been improving.  She reports that she was having side effects to the Seroquel so has been afraid to take it.  She reports that when she does take it she has tingling and numbness in her legs and arms sometimes.  She reports her depression and anxiety continue to be significant to the point that she had to leave her job.  She reports continuing to have issues with motivation in the morning.  She reports also having issues still with sleep waking up a few times during the night.  Confirmed with her again that she does not intend on having any more children.  Discussed starting Effexor as it can help with low energy.  Also discussed taking melatonin to help improve her sleep.  Discussed potential side effects and risks with these and she was agreeable to trialing them.  She reports no SI, HI, or AVH.  She reports her sleep is poor.  She reports her appetite is poor to fair.  She reports no other concerns at present.  She will return for follow-up in approximately 4 weeks.    Observations/Objective:  Psychiatric Specialty Exam: Physical Exam Constitutional:      General: She is not in acute distress.    Appearance: Normal appearance. She is not ill-appearing or toxic-appearing.   HENT:     Head: Normocephalic and atraumatic.  Pulmonary:     Effort: Pulmonary effort is normal.  Neurological:     General: No focal deficit present.     Mental Status: She is alert.     Review of Systems  Respiratory:  Negative for shortness of breath.   Cardiovascular:  Negative for chest pain.  Gastrointestinal:  Negative for abdominal pain, constipation, diarrhea, nausea and vomiting.  Neurological:  Negative for dizziness, weakness and headaches.  Psychiatric/Behavioral:  Positive for dysphoric mood and sleep disturbance. Negative for hallucinations and suicidal ideas. The patient is nervous/anxious.     unknown if currently breastfeeding.There is no height or weight on file to calculate BMI.  General Appearance: Casual and Fairly Groomed  Eye Contact:  Good  Speech:  Clear and Coherent and Normal Rate  Volume:  Normal  Mood:  Anxious and Dysphoric  Affect:  Appropriate and Congruent  Thought Process:  Coherent and Goal Directed  Orientation:  Full (Time, Place, and Person)  Thought Content:  WDL and Logical  Suicidal Thoughts:  No  Homicidal Thoughts:  No  Memory:  Immediate;   Good Recent;   Good  Judgement:  Good  Insight:  Good  Psychomotor Activity:  Normal  Concentration:  Concentration: Good and Attention Span: Good  Recall:  Good  Fund of Knowledge:  Good  Language:  Good  Akathisia:  Negative  Handed:  Right  AIMS (if indicated):     Assets:  Communication Skills  Desire for Improvement Housing Physical Health Resilience  ADL's:  Intact  Cognition:  WNL  Sleep:        Assessment and Plan:  Holly Vazquez is a 41 yr old female who presents for follow up and medication management.  PPHx is significant for MDD, Recurrent, Moderate, PMDD, Adjustment Disorder, Anxiety, and PTSD, with no hospitalizations or suicide attempts.    Holly Vazquez continues to struggle with depression and anxiety and issues with motivation.  Due to not tolerating Seroquel  we will stop this.  Given her issues with motivation we will start Effexor.  Also recommended she start melatonin to help with her sleep.  If the Effexor is successful in improving her motivation but she continues to have issues with sleep at her next appointment may consider switching off Zoloft and onto Remeron but given her issues with weight gain on Zyprexa we will need to discuss this thoroughly with her.  She will return for follow-up in approximately 4 weeks.   MDD, Recurrent, Mild  PTSD  PMDD: -Continue Zoloft 200 mg daily.  60 (100 mg) tablets with 1 refill -Continue Hydroxyzine 25 mg AM, 25 mg Noon, and 50 mg QHS for anxiety. 140 tablets with 0 refills. -Start Effexor XR 37.5 mg daily.  30 tablets with 1 refill. -Stop Seroquel -Start Melatonin 3 mg QHS for insomnia. OTC    Follow Up Instructions:    I discussed the assessment and treatment plan with the patient. The patient was provided an opportunity to ask questions and all were answered. The patient agreed with the plan and demonstrated an understanding of the instructions.   The patient was advised to call back or seek an in-person evaluation if the symptoms worsen or if the condition fails to improve as anticipated.  I provided 13 minutes of non-face-to-face time during this encounter.   Briant Cedar, MD

## 2022-09-29 ENCOUNTER — Telehealth (INDEPENDENT_AMBULATORY_CARE_PROVIDER_SITE_OTHER): Payer: Medicaid Other | Admitting: Student in an Organized Health Care Education/Training Program

## 2022-09-29 ENCOUNTER — Encounter (HOSPITAL_COMMUNITY): Payer: Self-pay | Admitting: Student in an Organized Health Care Education/Training Program

## 2022-09-29 DIAGNOSIS — F419 Anxiety disorder, unspecified: Secondary | ICD-10-CM

## 2022-09-29 DIAGNOSIS — F331 Major depressive disorder, recurrent, moderate: Secondary | ICD-10-CM

## 2022-09-29 DIAGNOSIS — F3281 Premenstrual dysphoric disorder: Secondary | ICD-10-CM

## 2022-09-29 DIAGNOSIS — F4310 Post-traumatic stress disorder, unspecified: Secondary | ICD-10-CM

## 2022-09-29 MED ORDER — MIRTAZAPINE 7.5 MG PO TABS: 7.5000 mg | ORAL_TABLET | Freq: Every day | ORAL | 1 refills | Status: DC

## 2022-09-29 MED ORDER — SERTRALINE HCL 100 MG PO TABS: 100.0000 mg | ORAL_TABLET | Freq: Every day | ORAL | Status: DC

## 2022-09-29 MED ORDER — HYDROXYZINE HCL 25 MG PO TABS: 25.0000 mg | ORAL_TABLET | Freq: Four times a day (QID) | ORAL | 1 refills | Status: DC | PRN

## 2022-09-29 MED ORDER — VENLAFAXINE HCL ER 75 MG PO CP24: 75.0000 mg | ORAL_CAPSULE | Freq: Every day | ORAL | 1 refills | Status: DC

## 2022-09-29 NOTE — Progress Notes (Signed)
BH MD/PA/NP OP Progress Note  09/29/2022 9:54 AM Holly Vazquez  MRN:  [id number]  Chief Complaint:  Chief Complaint  Patient presents with   Follow-up   Depression   Anxiety   HPI:  Holly Vazquez is a 41 yr old female who presents via Virtual Video Visit for follow up and medication management.  PPHx is significant for MDD, Recurrent, Moderate, PMDD, Adjustment Disorder, Anxiety, and PTSD, with no hospitalizations or suicide attempts.    She reports that she has had worsening of her symptoms since her last appointment.  She reports specifically her PMDD has been worsening and she noticed she has been a little bit snappy here with her kids.  When asked if she noticed any improvement with the Effexor she reports that she has noticed an increase in her motivation and has been able to do more things since starting it.  She reports that she did start a new job on Monday but does not know if she will be able to continue with it due to the cost of traveling to the job but that she was able to do this.  Discussed with her that since she has had a positive response to the Effexor and the Zoloft does not appear to be working we will begin to taper off the Zoloft and onto the Effexor.  Discussed with her that she should decrease her Zoloft to 100 mg starting today for 1 week then take 50 mg for 4 days and then stop.  Discussed increasing the Effexor to 75 mg in 4 days.  She reported understanding of this plan and had no questions.  Also discussed starting Remeron once she has finished the taper completely off the Zoloft.  Discussed with her that this medication will help with her depression and anxiety but also can help with her sleep since this continues to be an issue.  Discussed with her that at the starting dose Remeron can have an increase in appetite but if this is effective in helping her sleep at the higher doses of Remeron the appetite stimulation falls off and she was agreeable with this.  She  reports no SI, HI, or AVH.  She reports her sleep is poor.  She reports her appetite is poor.  She reports no other concerns at present.  She will return for follow-up in 4 weeks.   Visit Diagnosis:    ICD-10-CM   1. PMDD (premenstrual dysphoric disorder)  F32.81 sertraline (ZOLOFT) 100 MG tablet    venlafaxine XR (EFFEXOR XR) 75 MG 24 hr capsule    mirtazapine (REMERON) 7.5 MG tablet    2. MDD (major depressive disorder), recurrent episode, moderate (HCC)  F33.1 sertraline (ZOLOFT) 100 MG tablet    venlafaxine XR (EFFEXOR XR) 75 MG 24 hr capsule    mirtazapine (REMERON) 7.5 MG tablet    3. Anxiety  F41.9 hydrOXYzine (ATARAX) 25 MG tablet    mirtazapine (REMERON) 7.5 MG tablet    4. PTSD (post-traumatic stress disorder)  F43.10 hydrOXYzine (ATARAX) 25 MG tablet    venlafaxine XR (EFFEXOR XR) 75 MG 24 hr capsule    mirtazapine (REMERON) 7.5 MG tablet      Past Psychiatric History: MDD, Recurrent, Moderate, PMDD, Adjustment Disorder, Anxiety, and PTSD, with no hospitalizations or suicide attempts.   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   Anemia    due to heavy  menses, on iron   Anxiety    Cervical polyp 03/20/2018   Removed 03-20-18   Contact dermatitis and other eczema, due to unspecified cause    Depression    Fibroids    Heart murmur    States always told it was from anemia   Restless legs syndrome (RLS)    Urinary tract infection, site not specified     Past Surgical History:  Procedure Laterality Date   WISDOM TOOTH EXTRACTION      Family Psychiatric History: Father - Depression, Anxiety, Polysubstance abuse (EtOH, Crack, PCP) Mother - Depression, Anxiety, Crack abuse Maternal Grandmother - Anxiety Adopted Cousin - Suicide at age 28  Family History:  Family History  Problem Relation Age of Onset   Hypertension Father    Heart disease Maternal  Grandmother    Stroke Maternal Grandmother    Hypertension Maternal Grandmother    Eczema Son    Eczema Son    Eczema Son    Lung cancer Mother     Social History:  Social History   Socioeconomic History   Marital status: Married    Spouse name: Not on file   Number of children: Not on file   Years of education: Not on file   Highest education level: Not on file  Occupational History   Not on file  Tobacco Use   Smoking status: Never   Smokeless tobacco: Never  Vaping Use   Vaping Use: Never used  Substance and Sexual Activity   Alcohol use: No   Drug use: Yes    Types: Marijuana   Sexual activity: Yes    Birth control/protection: None    Comment: Husband - vasectomy  Other Topics Concern   Not on file  Social History Narrative   Not on file   Social Determinants of Health   Financial Resource Strain: Not on file  Food Insecurity: Food Insecurity Present (01/16/2022)   Hunger Vital Sign    Worried About Running Out of Food in the Last Year: Sometimes true    Ran Out of Food in the Last Year: Sometimes true  Transportation Needs: No Transportation Needs (01/16/2022)   PRAPARE - Hydrologist (Medical): No    Lack of Transportation (Non-Medical): No  Physical Activity: Not on file  Stress: Not on file  Social Connections: Not on file    Allergies:  Allergies  Allergen Reactions   Flagyl [Metronidazole] Hives   Latex Hives   Nickel Dermatitis    trigger   Penicillins Rash    Metabolic Disorder Labs: Lab Results  Component Value Date   HGBA1C 5.7 (H) 07/11/2021   No results found for: "PROLACTIN" No results found for: "CHOL", "TRIG", "HDL", "CHOLHDL", "VLDL", "LDLCALC" Lab Results  Component Value Date   TSH 0.803 07/11/2021   TSH 1.000 09/27/2017    Therapeutic Level Labs: No results found for: "LITHIUM" No results found for: "VALPROATE" No results found for: "CBMZ"  Current Medications: Current Outpatient  Medications  Medication Sig Dispense Refill   mirtazapine (REMERON) 7.5 MG tablet Take 1 tablet (7.5 mg total) by mouth at bedtime. 30 tablet 1   acetaminophen (TYLENOL) 500 MG tablet Take 2 tablets (1,000 mg total) by mouth every 8 (eight) hours as needed (pain). (Patient not taking: Reported on 06/28/2022) 60 tablet 0   Blood Pressure Monitoring DEVI 1 each by Does not apply route once a week. (Patient not taking: Reported on 06/28/2022) 1 each 0   clindamycin (  CLEOCIN) 300 MG capsule Take 1 capsule (300 mg total) by mouth in the morning and at bedtime. 14 capsule 0   ferrous sulfate 325 (65 FE) MG tablet Take 1 tablet (325 mg total) by mouth every other day. (Patient not taking: Reported on 06/28/2022) 30 tablet 5   hydrOXYzine (ATARAX) 25 MG tablet Take 1 tablet (25 mg total) by mouth every 6 (six) hours as needed. 140 tablet 1   ibuprofen (ADVIL) 600 MG tablet Take 1 tablet (600 mg total) by mouth every 6 (six) hours as needed (pain). (Patient not taking: Reported on 06/28/2022) 40 tablet 0   nitrofurantoin, macrocrystal-monohydrate, (MACROBID) 100 MG capsule Take 1 capsule (100 mg total) by mouth 2 (two) times daily. 10 capsule 0   Prenatal Vit-Fe Fumarate-FA (PREPLUS) 27-1 MG TABS Take 1 tablet by mouth daily. (Patient not taking: Reported on 06/28/2022) 30 tablet 13   sertraline (ZOLOFT) 100 MG tablet Take 1 tablet (100 mg total) by mouth daily. Take 1 tablet for 7 days then take a half tablet for 4 days then stop     venlafaxine XR (EFFEXOR XR) 75 MG 24 hr capsule Take 1 capsule (75 mg total) by mouth daily. 30 capsule 1   No current facility-administered medications for this visit.     Musculoskeletal: Strength & Muscle Tone: within normal limits Gait & Station: normal Patient leans: N/A  Psychiatric Specialty Exam: Review of Systems  Respiratory:  Negative for shortness of breath.   Cardiovascular:  Negative for chest pain.  Gastrointestinal:  Negative for abdominal pain,  constipation, diarrhea, nausea and vomiting.  Neurological:  Negative for dizziness, weakness and headaches.  Psychiatric/Behavioral:  Positive for dysphoric mood and sleep disturbance. Negative for hallucinations and suicidal ideas. The patient is nervous/anxious.     unknown if currently breastfeeding.There is no height or weight on file to calculate BMI.  General Appearance: Casual and Fairly Groomed  Eye Contact:  Good  Speech:  Clear and Coherent and Normal Rate  Volume:  Normal  Mood:  Anxious and Dysphoric  Affect:  Congruent  Thought Process:  Coherent and Goal Directed  Orientation:  Full (Time, Place, and Person)  Thought Content: WDL and Logical   Suicidal Thoughts:  No  Homicidal Thoughts:  No  Memory:  Immediate;   Good Recent;   Good  Judgement:  Good  Insight:  Good  Psychomotor Activity:  Normal  Concentration:  Concentration: Good and Attention Span: Good  Recall:  Good  Fund of Knowledge: Good  Language: Good  Akathisia:  Negative  Handed:  Right  AIMS (if indicated): not done  Assets:  Communication Skills Desire for Improvement Housing Physical Health Resilience  ADL's:  Intact  Cognition: WNL  Sleep:  Poor   Screenings: GAD-7    Health and safety inspector 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/15/2022 in [facility] Routine Prenatal from 01/16/2022 in [facility] for [place] [facility] Routine Prenatal from 01/09/2022 in [facility] for [place] at [facility] Routine Prenatal from 12/30/2021 in [place] for [place] at [facility] for [facility] Routine Prenatal from 12/14/2021 in [place] for [place] at [facility]  Total GAD-7 Score '10 3 6 4 5      '$ PHQ2-9    Flowsheet Row Routine Prenatal from 01/16/2022 in [facility] for [place] at [facility] Routine Prenatal from 01/09/2022 in [facility] for [place] at [facility] for  Women Routine  Prenatal from 12/30/2021 in [place] [facility] [place] [facility] for Women Routine Prenatal from 12/14/2021 in [facility] for [place] at [facility] for Women Routine Prenatal from 11/02/2021 in [facility] for [place] at [facility] for Women  PHQ-2 Total Score 0 '4 1 2 4  '$ PHQ-9 Total Score '3 6 6 5 18      '$ Flowsheet Row Admission (Discharged) from 01/21/2022 in [place] [facility] Counselor from 09/06/2021 in [place] No Risk No Risk        Assessment and Plan:  Halen Mossbarger is a 41 yr old female who presents via Virtual Video Visit for follow up and medication management.  PPHx is significant for MDD, Recurrent, Moderate, PMDD, Adjustment Disorder, Anxiety, and PTSD, with no hospitalizations or suicide attempts.     Dazia has had some improvement since starting the Effexor and that her motivation has improved, however, her PMDD has been worsening and her sleep has been poor.  Due to this we will begin to taper off the Zoloft.  Starting today she will decrease to 100 mg for 1 week and then further decreased to 50 mg for 4 days and then stop the Zoloft.  Once she stops the Zoloft she will then start Remeron.  As we are reducing the Zoloft we will further increase her Effexor in 4 days.  She will return for follow-up in approximately 4 weeks.   MDD, Recurrent, Mild  PTSD  PMDD: -Decrease Zoloft to 100 mg for 7 days then decrease to 50 mg daily for 4 days then stop. -Continue Hydroxyzine 25 mg AM, 25 mg Noon, and 50 mg QHS for anxiety. 140 tablets with 0 refills. -Increase Effexor XR to 75 mg daily starting Monday 12/18.  30 tablets with 1 refill. -Start Remeron 7.5 mg QHS once Zoloft is stopped.  30 tablets with 1 refill. -Continue Melatonin 3 mg QHS for insomnia. OTC   Collaboration of Care:   Patient/Guardian was advised Release of Information must be obtained  prior to any record release in order to collaborate their care with an outside provider. Patient/Guardian was advised if they have not already done so to contact the registration department to sign all necessary forms in order for [place] to release information regarding their care.   Consent: Patient/Guardian gives verbal consent for treatment and assignment of benefits for services provided during this visit. Patient/Guardian expressed understanding and agreed to proceed.    Briant Cedar, MD 09/29/2022, 9:54 AM

## 2022-10-02 ENCOUNTER — Telehealth (HOSPITAL_COMMUNITY): Payer: Self-pay | Admitting: *Deleted

## 2022-10-02 DIAGNOSIS — F331 Major depressive disorder, recurrent, moderate: Secondary | ICD-10-CM

## 2022-10-02 DIAGNOSIS — F3281 Premenstrual dysphoric disorder: Secondary | ICD-10-CM

## 2022-10-02 DIAGNOSIS — F4310 Post-traumatic stress disorder, unspecified: Secondary | ICD-10-CM

## 2022-10-02 NOTE — Telephone Encounter (Signed)
[place], [place]  Refill Request venlafaxine XR (EFFEXOR XR) 75 MG 24 hr capsule

## 2022-10-03 ENCOUNTER — Telehealth (HOSPITAL_COMMUNITY): Payer: Self-pay | Admitting: Student in an Organized Health Care Education/Training Program

## 2022-10-03 IMAGING — US US MFM FETAL BPP W/O NON-STRESS
1 series · 15 of 28 positions shown · non-contrast
Comparison: none

[Series 1: us mfm fetal bpp w/o non-stress · 37 acquisitions, 15 frames shown]
[im 1/37]
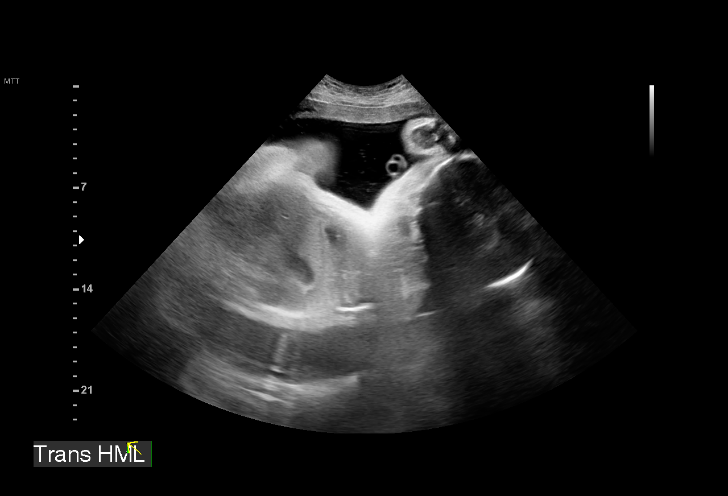
[im 3/37]
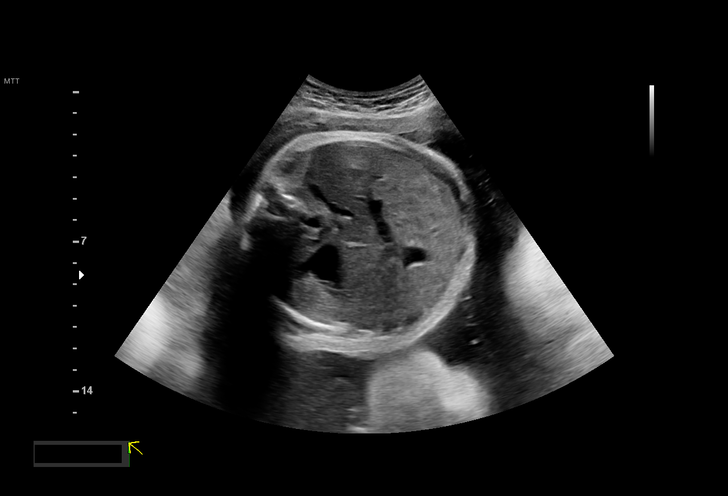
[im 6/37]
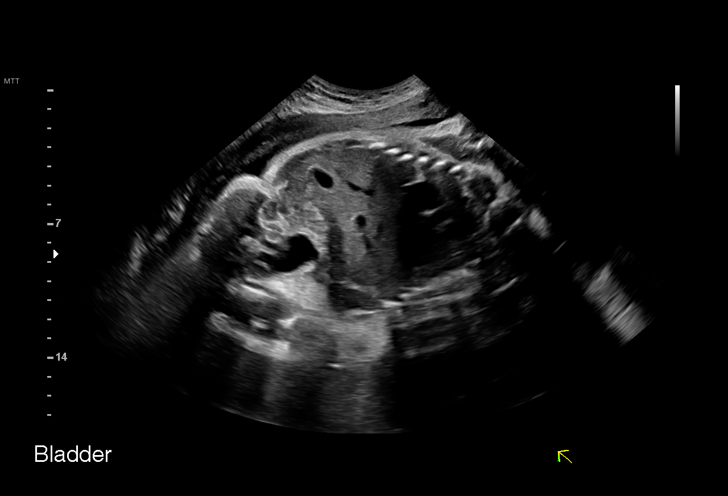
[im 9/37]
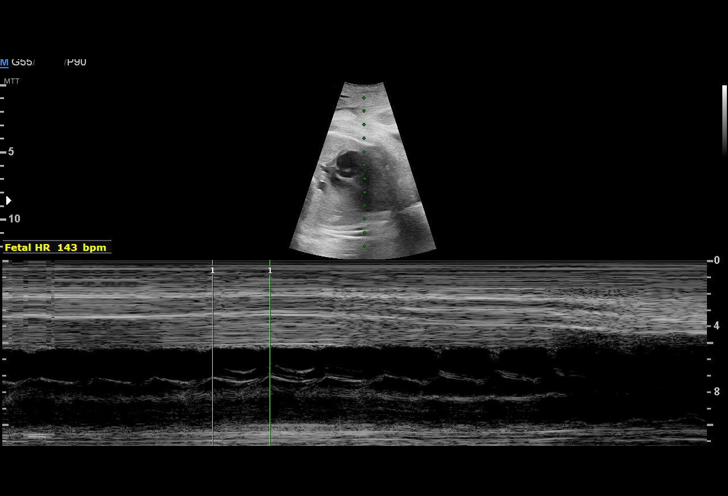
[im 11/37]
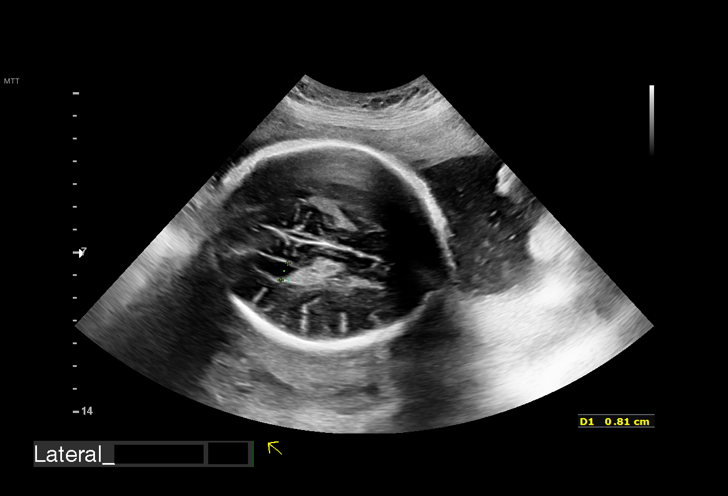
[im 14/37]
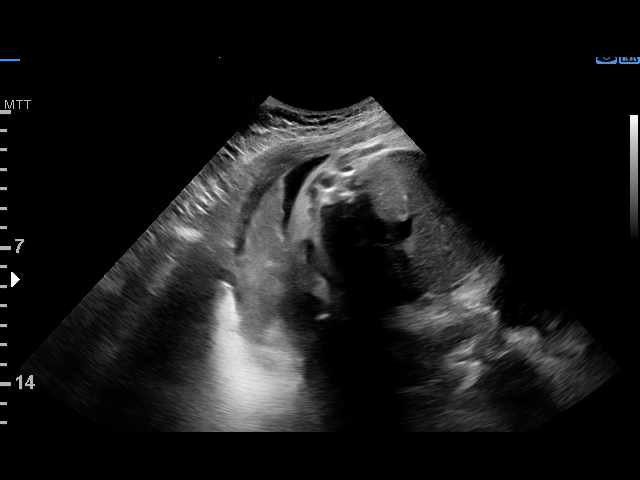
[im 17/37]
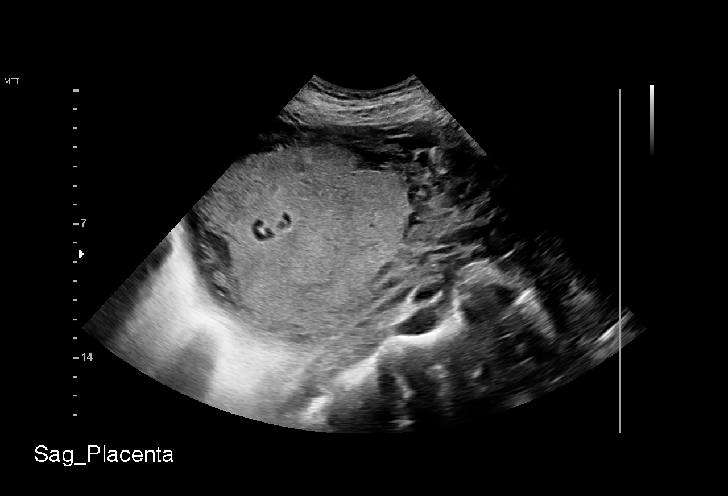
[im 19/37]
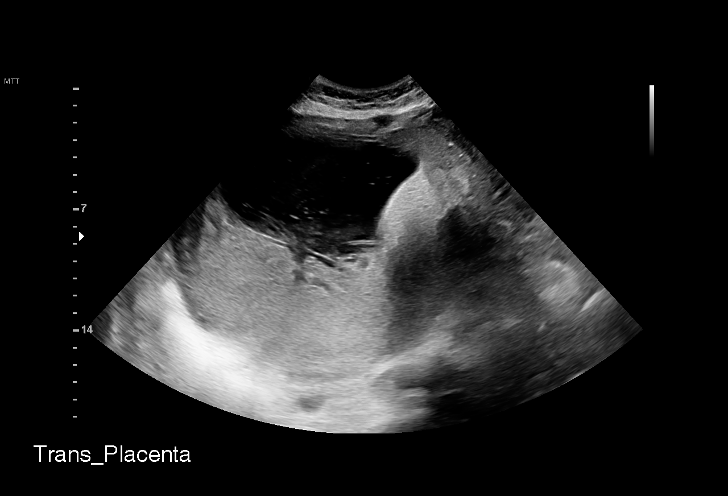
[im 21/37]
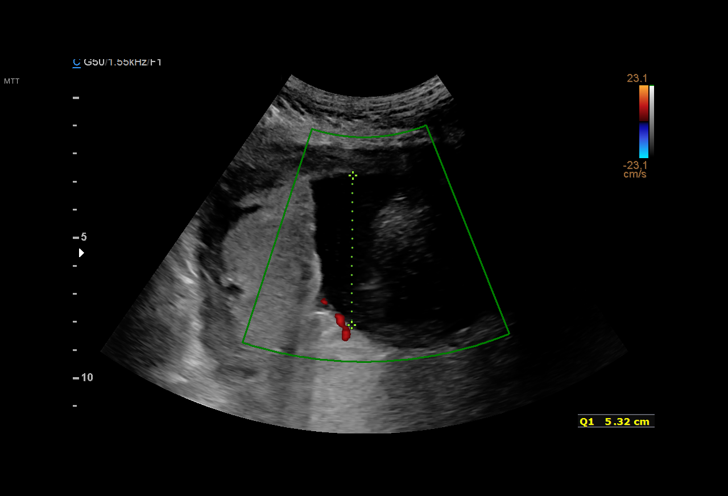
[im 23/37]
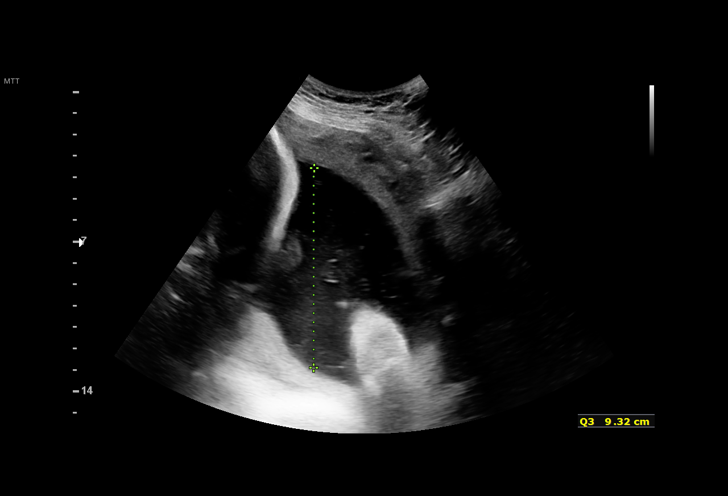
[im 26/37]
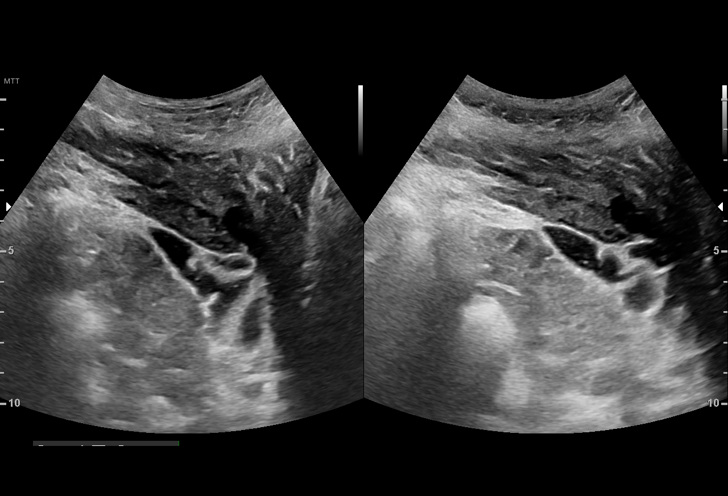
[im 29/37]
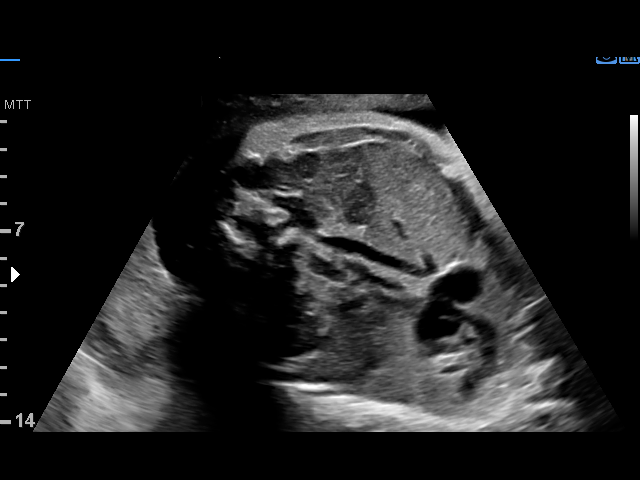
[im 31/37]
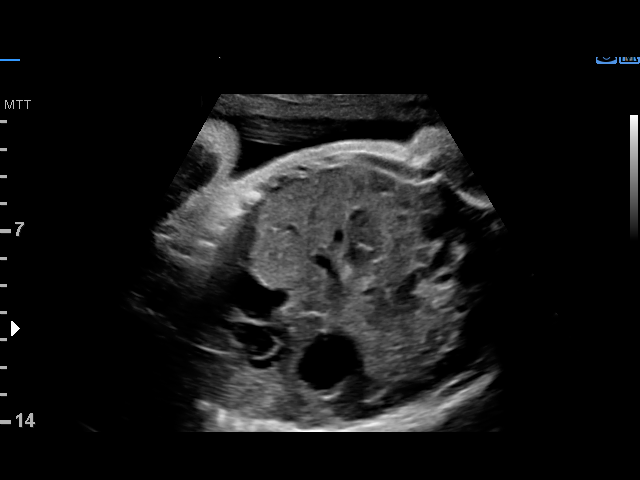
[im 34/37]
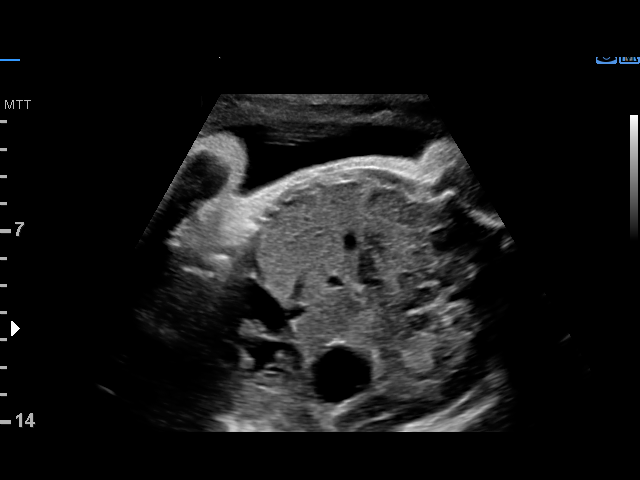
[im 37/37]
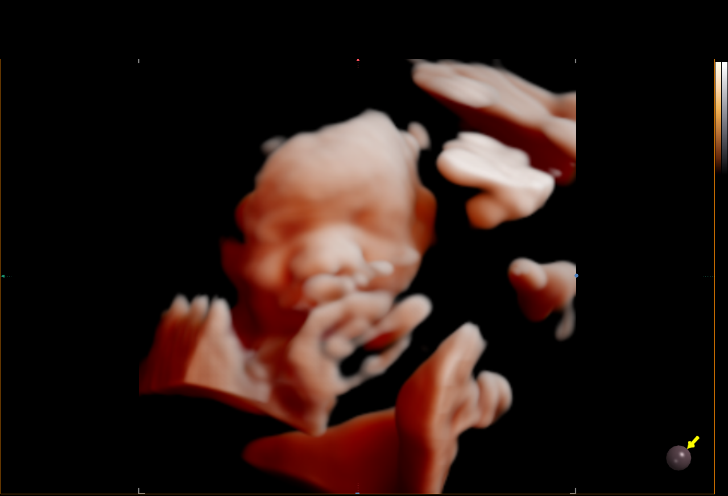

[15 of 28 positions shown; findings below may reference images not displayed]

Indications

 Advanced maternal age multigravida 35+,
 third trimester (40 yo)
 Grand multiparity, antepartum
 Uterine fibroids affecting pregnancy in third  O34.13,
 trimester, antepartum
 37 weeks gestation of pregnancy
 Genetic Zeynalov Orta (a-/a-), SMA
 LR NIPS, Neg AFP
Fetal Evaluation

 Num Of Fetuses:         1
 Fetal Heart Rate(bpm):  143
 Cardiac Activity:       Observed
 Presentation:           Cephalic
 Placenta:               Posterior
 P. Cord Insertion:      Previously Visualized

 Amniotic Fluid
 AFI FV:      Polyhydramnios

 AFI Sum(cm)     %Tile       Largest Pocket(cm)
 23.8            93

 RUQ(cm)       RLQ(cm)       LUQ(cm)        LLQ(cm)

Biophysical Evaluation

 Amniotic F.V:   Within normal limits       F. Tone:        Observed
 F. Movement:    Observed                   Score:          [DATE]
 F. Breathing:   Observed
OB History

 Gravidity:    6         Term:   5
 Living:       5
Gestational Age

 LMP:           37w 0d        Date:  04/22/21                 EDD:   01/27/22
 Best:          37w 0d     Det. By:  LMP  (04/22/21)          EDD:   01/27/22
Anatomy

 Ventricles:            Appears normal         Stomach:                Appears normal, left
                                                                       sided
 Thoracic:              Appears normal         Kidneys:                Appear normal
 Diaphragm:             Appears normal         Bladder:                Appears normal

 Other:  Technically difficult due to advanced GA and fetal position.
Cervix Uterus Adnexa

 Cervix
 Not visualized (advanced GA >30wks)

 Uterus
 No abnormality visualized.

 Right Ovary
 Within normal limits.

 Left Ovary
 Within normal limits.

 Cul De Sac
 No free fluid seen.

 Adnexa
 No abnormality visualized.
Comments

 This patient was seen for a BPP due to a fibroid uterus,
 advanced maternal age, and grand multiparity.  She denies
 any problems since her last exam and has screened negative
 for gestational diabetes.
 A BPP performed today was [DATE].
 Borderline polyhydramnios was noted today with a total AFI
 of 23.8 cm.  However, a maximal vertical pocket of 9.3 cm
 was present.
 She will return in 1 week for another BPP.

## 2022-10-03 MED ORDER — VENLAFAXINE HCL ER 75 MG PO CP24: 75.0000 mg | ORAL_CAPSULE | Freq: Every day | ORAL | 1 refills | Status: DC

## 2022-10-03 NOTE — Addendum Note (Signed)
Addended by: Briant Cedar on: 10/03/2022 06:48 AM   Modules accepted: Orders

## 2022-10-03 NOTE — Telephone Encounter (Signed)
Received request for new does of Effexor which had been discussed to start taking at appointment on 12/15.  This will be re-sent.   Sent: -Effexor XR 75 mg daily.  30 tablets with 1 refill.    Fatima Sanger MD Resident

## 2022-10-30 ENCOUNTER — Encounter (HOSPITAL_COMMUNITY): Payer: Medicaid Other | Admitting: Student in an Organized Health Care Education/Training Program

## 2022-10-30 NOTE — Progress Notes (Signed)
This encounter was created in error - please disregard.

## 2022-11-03 ENCOUNTER — Telehealth (HOSPITAL_COMMUNITY): Payer: Self-pay | Admitting: *Deleted

## 2022-11-03 DIAGNOSIS — F419 Anxiety disorder, unspecified: Secondary | ICD-10-CM

## 2022-11-03 DIAGNOSIS — F4310 Post-traumatic stress disorder, unspecified: Secondary | ICD-10-CM

## 2022-11-03 DIAGNOSIS — F331 Major depressive disorder, recurrent, moderate: Secondary | ICD-10-CM

## 2022-11-03 DIAGNOSIS — F3281 Premenstrual dysphoric disorder: Secondary | ICD-10-CM

## 2022-11-03 MED ORDER — MIRTAZAPINE 7.5 MG PO TABS: 7.5000 mg | ORAL_TABLET | Freq: Every day | ORAL | 1 refills | Status: DC

## 2022-11-03 MED ORDER — VENLAFAXINE HCL ER 75 MG PO CP24: 75.0000 mg | ORAL_CAPSULE | Freq: Every day | ORAL | 1 refills | Status: DC

## 2022-11-03 NOTE — Telephone Encounter (Signed)
RX REFILL REQUEST -- [place]   mirtazapine (REMERON) 7.5 MG tablet  sertraline (ZOLOFT) 100 MG tablet  QUETIAPINE FUMARATE 100 MF ORAL TABLET

## 2022-11-03 NOTE — Telephone Encounter (Signed)
Received request for refill of patient's medications.  Did not send Zoloft or Seroquel as these medications have been stopped.  The following were sent.  Sent: -Effexor XR 75 mg daily.  30 tablets with 1 refill. -Remeron 7.5 mg QHS.  30 tablets with 1 refill.   Fatima Sanger MD Resident

## 2022-11-03 NOTE — Addendum Note (Signed)
Addended by: Briant Cedar on: 11/03/2022 03:59 PM   Modules accepted: Orders

## 2022-11-09 ENCOUNTER — Ambulatory Visit (INDEPENDENT_AMBULATORY_CARE_PROVIDER_SITE_OTHER): Payer: Medicaid Other | Admitting: *Deleted

## 2022-11-09 ENCOUNTER — Other Ambulatory Visit (HOSPITAL_COMMUNITY)
Admission: RE | Admit: 2022-11-09 | Discharge: 2022-11-09 | Disposition: A | Discharge status: Home / Self Care | Payer: Medicaid Other | Source: Ambulatory Visit | Attending: Family Medicine | Admitting: Family Medicine

## 2022-11-09 VITALS — BP 138/71 | HR 75 | Ht 64.5 in | Wt 178.7 lb

## 2022-11-09 DIAGNOSIS — R300 Dysuria: Secondary | ICD-10-CM

## 2022-11-09 DIAGNOSIS — N898 Other specified noninflammatory disorders of vagina: Secondary | ICD-10-CM

## 2022-11-09 NOTE — Progress Notes (Signed)
Here for self swab for c/o bad vaginal odor, and grey/ yellowish vaginal odor. Also c/o pain and burning with urination. Will do self swab and ua. Ua showing leucocytes. Sent urine for culture. Explained to patient if any results + needing treatment she will be contacted. She voices understanding. Staci Acosta

## 2022-11-12 LAB — URINE CULTURE

## 2022-11-13 LAB — CERVICOVAGINAL ANCILLARY ONLY
Bacterial Vaginitis (gardnerella): POSITIVE — AB
Candida Glabrata: NEGATIVE
Candida Vaginitis: NEGATIVE
Chlamydia: NEGATIVE
Comment: NEGATIVE
Comment: NEGATIVE
Comment: NEGATIVE
Comment: NEGATIVE
Comment: NEGATIVE
Comment: NORMAL
Neisseria Gonorrhea: NEGATIVE
Trichomonas: NEGATIVE

## 2022-11-13 LAB — POCT URINALYSIS DIP (DEVICE)
Bilirubin Urine: NEGATIVE
Glucose, UA: NEGATIVE mg/dL
Hgb urine dipstick: NEGATIVE
Ketones, ur: NEGATIVE mg/dL
Nitrite: NEGATIVE
Protein, ur: NEGATIVE mg/dL
Specific Gravity, Urine: 1.02 (ref 1.005–1.030)
Urobilinogen, UA: 0.2 mg/dL (ref 0.0–1.0)
pH: 7 (ref 5.0–8.0)

## 2022-11-14 ENCOUNTER — Other Ambulatory Visit: Payer: Self-pay | Admitting: Obstetrics and Gynecology

## 2022-11-14 DIAGNOSIS — N3000 Acute cystitis without hematuria: Secondary | ICD-10-CM

## 2022-11-14 DIAGNOSIS — B9689 Other specified bacterial agents as the cause of diseases classified elsewhere: Secondary | ICD-10-CM

## 2022-11-14 MED ORDER — CLINDAMYCIN PHOSPHATE 2 % VA CREA: 1.0000 | TOPICAL_CREAM | Freq: Every day | VAGINAL | 0 refills | Status: AC

## 2022-11-14 MED ORDER — NITROFURANTOIN MONOHYD MACRO 100 MG PO CAPS: 100.0000 mg | ORAL_CAPSULE | Freq: Two times a day (BID) | ORAL | 1 refills | Status: AC

## 2022-11-24 ENCOUNTER — Telehealth (HOSPITAL_COMMUNITY): Payer: Medicaid Other | Admitting: Student in an Organized Health Care Education/Training Program

## 2022-11-24 NOTE — Progress Notes (Unsigned)
BH MD/PA/NP OP Progress Note  Virtual Visit via Video Note  I connected with Holly Vazquez on 11/24/22 at  1:00 PM EST by a video enabled telemedicine application and verified that I am speaking with the correct person using two identifiers.  Location: Patient: *** Provider: [facility]   I discussed the limitations of evaluation and management by telemedicine and the availability of in person appointments. The patient expressed understanding and agreed to proceed.   11/24/2022 6:47 AM Holly Vazquez  MRN:  [id number]  Chief Complaint: No chief complaint on file.  HPI:  Holly Vazquez is a 42 yr old female who presents via Virtual Video Visit for Follow Up and Medication Management.  PPHx is significant for MDD, Recurrent, Moderate, PMDD, Adjustment Disorder, Anxiety, and PTSD, with no hospitalizations or suicide attempts.   ***  Visit Diagnosis: No diagnosis found.  Past Psychiatric History: MDD, Recurrent, Moderate, PMDD, Adjustment Disorder, Anxiety, and PTSD, with no hospitalizations or suicide attempts.   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   Anemia    due to heavy menses, on iron   Anxiety    Cervical polyp 03/20/2018   Removed 03-20-18   Contact dermatitis and other eczema, due to unspecified cause    Depression    Fibroids    Heart murmur    States always told it was from anemia   Restless legs syndrome (RLS)    Urinary tract infection, site not specified     Past Surgical History:  Procedure Laterality Date   WISDOM TOOTH EXTRACTION      Family Psychiatric History: Father - Depression, Anxiety, Polysubstance abuse (EtOH, Crack, PCP) Mother - Depression, Anxiety, Crack abuse Maternal Grandmother - Anxiety Adopted Cousin - Suicide at age 45  Family History:  Family History  Problem Relation Age of Onset   Hypertension Father    Heart  disease Maternal Grandmother    Stroke Maternal Grandmother    Hypertension Maternal Grandmother    Eczema Son    Eczema Son    Eczema Son    Lung cancer Mother     Social History:  Social History   Socioeconomic History   Marital status: Married    Spouse name: Not on file   Number of children: Not on file   Years of education: Not on file   Highest education level: Not on file  Occupational History   Not on file  Tobacco Use   Smoking status: Never   Smokeless tobacco: Never  Vaping Use   Vaping Use: Never used  Substance and Sexual Activity   Alcohol use: No   Drug use: Yes    Types: Marijuana   Sexual activity: Yes    Birth control/protection: None    Comment: Husband - vasectomy  Other Topics Concern   Not on file  Social History Narrative   Not on file   Social Determinants of Health   Financial Resource Strain: Not on file  Food Insecurity: Food Insecurity Present (01/16/2022)   Hunger Vital Sign    Worried About Running Out of Food in the Last Year: Sometimes true    Ran Out of Food in the Last Year: Sometimes true  Transportation Needs: No Transportation Needs (01/16/2022)   PRAPARE - Hydrologist (Medical): No    Lack of Transportation (Non-Medical): No  Physical Activity: Not on file  Stress: Not on file  Social Connections: Not on file    Allergies:  Allergies  Allergen Reactions   Flagyl [Metronidazole] Hives   Latex Hives   Nickel Dermatitis    trigger   Penicillins Rash    Metabolic Disorder Labs: Lab Results  Component Value Date   HGBA1C 5.7 (H) 07/11/2021   No results found for: "PROLACTIN" No results found for: "CHOL", "TRIG", "HDL", "CHOLHDL", "VLDL", "LDLCALC" Lab Results  Component Value Date   TSH 0.803 07/11/2021   TSH 1.000 09/27/2017    Therapeutic Level Labs: No results found for: "LITHIUM" No results found for: "VALPROATE" No results found for: "CBMZ"  Current Medications: Current  Outpatient Medications  Medication Sig Dispense Refill   acetaminophen (TYLENOL) 500 MG tablet Take 2 tablets (1,000 mg total) by mouth every 8 (eight) hours as needed (pain). 60 tablet 0   Blood Pressure Monitoring DEVI 1 each by Does not apply route once a week. 1 each 0   clindamycin (CLEOCIN) 300 MG capsule Take 1 capsule (300 mg total) by mouth in the morning and at bedtime. 14 capsule 0   ferrous sulfate 325 (65 FE) MG tablet Take 1 tablet (325 mg total) by mouth every other day. (Patient not taking: Reported on 06/28/2022) 30 tablet 5   hydrOXYzine (ATARAX) 25 MG tablet Take 1 tablet (25 mg total) by mouth every 6 (six) hours as needed. 140 tablet 1   ibuprofen (ADVIL) 600 MG tablet Take 1 tablet (600 mg total) by mouth every 6 (six) hours as needed (pain). (Patient not taking: Reported on 11/09/2022) 40 tablet 0   mirtazapine (REMERON) 7.5 MG tablet Take 1 tablet (7.5 mg total) by mouth at bedtime. 30 tablet 1   nitrofurantoin, macrocrystal-monohydrate, (MACROBID) 100 MG capsule Take 1 capsule (100 mg total) by mouth 2 (two) times daily. 14 capsule 1   Prenatal Vit-Fe Fumarate-FA (PREPLUS) 27-1 MG TABS Take 1 tablet by mouth daily. 30 tablet 13   venlafaxine XR (EFFEXOR XR) 75 MG 24 hr capsule Take 1 capsule (75 mg total) by mouth daily. 30 capsule 1   No current facility-administered medications for this visit.     Musculoskeletal: Strength & Muscle Tone: {desc; muscle tone:32375} Gait & Station: {PE GAIT ED QX:8161427 Patient leans: {Patient Leans:21022755}  Psychiatric Specialty Exam: Review of Systems  unknown if currently breastfeeding.There is no height or weight on file to calculate BMI.  General Appearance: {Appearance:22683}  Eye Contact:  {BHH EYE CONTACT:22684}  Speech:  {Speech:22685}  Volume:  {Volume (PAA):22686}  Mood:  {BHH MOOD:22306}  Affect:  {Affect (PAA):22687}  Thought Process:  {Thought Process (PAA):22688}  Orientation:  {BHH ORIENTATION (PAA):22689}   Thought Content: {Thought Content:22690}   Suicidal Thoughts:  {ST/HT (PAA):22692}  Homicidal Thoughts:  {ST/HT (PAA):22692}  Memory:  {BHH MEMORY:22881}  Judgement:  {Judgement (PAA):22694}  Insight:  {Insight (PAA):22695}  Psychomotor Activity:  {Psychomotor (PAA):22696}  Concentration:  {Concentration:21399}  Recall:  {BHH GOOD/FAIR/POOR:22877}  Fund of Knowledge: {BHH GOOD/FAIR/POOR:22877}  Language: {BHH GOOD/FAIR/POOR:22877}  Akathisia:  {BHH YES OR NO:22294}  Handed:  Right  AIMS (if indicated): {Desc; done/not:10129}  Assets:  {Assets (PAA):22698}  ADL's:  {BHH TW:9249394  Cognition: {chl bhh cognition:304700322}  Sleep:  {BHH GOOD/FAIR/POOR:22877}   Screenings: GAD-7    Health and safety inspector from 03/15/2022 in [facility] Routine Prenatal from 01/16/2022 in [facility] for [place] [facility] Routine Prenatal from 01/09/2022 in Center for [employer] [facility] for  Women Routine Prenatal from 12/30/2021 in Center for [place] [facility] for [facility] Routine Prenatal from 12/14/2021 in Center for [place] [facility] for [facility]  Total GAD-7 Score 10 3 6 4 5      $ PHQ2-9    Flowsheet Row Routine Prenatal from 01/16/2022 in [facility] [place] [facility] Routine Prenatal from 01/09/2022 in [facility] [place] [facility] Routine Prenatal from 12/30/2021 in [facility] [place] [facility] for Women Routine Prenatal from 12/14/2021 in Center for [place] [facility] for Women Routine Prenatal from 11/02/2021 in [place] [facility] [place] [facility] for Women  PHQ-2 Total Score 0 4 1 2 4  $ PHQ-9 Total Score 3 6 6 5 18      $ Flowsheet Row Admission (Discharged) from 01/21/2022 in [facility] Counselor from 09/06/2021 in [place] No Risk No Risk        Assessment and Plan:  Holly Vazquez is a 42 yr old female who presents via Virtual Video Visit for Follow Up and Medication Management.  PPHx is significant for MDD, Recurrent, Moderate, PMDD, Adjustment Disorder, Anxiety, and PTSD, with no hospitalizations or suicide attempts.    ***   MDD, Recurrent, Mild  PTSD  PMDD: -Decrease Zoloft to 100 mg for 7 days then decrease to 50 mg daily for 4 days then stop. -Continue Hydroxyzine 25 mg AM, 25 mg Noon, and 50 mg QHS for anxiety. 140 tablets with 0 refills. -Increase Effexor XR to 75 mg daily starting Monday 12/18.  30 tablets with 1 refill. -Start Remeron 7.5 mg QHS once Zoloft is stopped.  30 tablets with 1 refill. -Continue Melatonin 3 mg QHS for insomnia. OTC    Collaboration of Care: Collaboration of Care: {BH OP Collaboration of Care:21014065}  Patient/Guardian was advised Release of Information must be obtained prior to any record release in order to collaborate their care with an outside provider. Patient/Guardian was advised if they have not already done so to contact the registration department to sign all necessary forms in order for [place] to release information regarding their care.   Consent: Patient/Guardian gives verbal consent for treatment and assignment of benefits for services provided during this visit. Patient/Guardian expressed understanding and agreed to proceed.    Briant Cedar, MD 11/24/2022, 6:47 AM    Follow Up Instructions:    I discussed the assessment and treatment plan with the patient. The patient was provided an opportunity to ask questions and all were answered. The patient agreed with the plan and demonstrated an understanding of the instructions.   The patient was advised to call back or seek an in-person evaluation if the symptoms worsen or if the condition fails to improve as anticipated.  I provided ***  minutes of non-face-to-face time during this encounter.   Briant Cedar, MD

## 2022-12-06 ENCOUNTER — Encounter (HOSPITAL_COMMUNITY): Payer: Self-pay

## 2022-12-06 ENCOUNTER — Emergency Department (HOSPITAL_COMMUNITY)
Admission: EM | Admit: 2022-12-06 | Discharge: 2022-12-06 | Disposition: A | Discharge status: Home / Self Care | Payer: Medicaid Other | Attending: Emergency Medicine | Admitting: Emergency Medicine

## 2022-12-06 ENCOUNTER — Other Ambulatory Visit: Payer: Self-pay

## 2022-12-06 ENCOUNTER — Telehealth: Payer: Self-pay

## 2022-12-06 DIAGNOSIS — F10129 Alcohol abuse with intoxication, unspecified: Secondary | ICD-10-CM | POA: Diagnosis present

## 2022-12-06 DIAGNOSIS — F10120 Alcohol abuse with intoxication, uncomplicated: Secondary | ICD-10-CM | POA: Diagnosis not present

## 2022-12-06 DIAGNOSIS — Z91040 Latex allergy status: Secondary | ICD-10-CM | POA: Diagnosis not present

## 2022-12-06 DIAGNOSIS — Y907 Blood alcohol level of 200-239 mg/100 ml: Secondary | ICD-10-CM | POA: Insufficient documentation

## 2022-12-06 DIAGNOSIS — F10920 Alcohol use, unspecified with intoxication, uncomplicated: Secondary | ICD-10-CM

## 2022-12-06 LAB — CBC
HCT: 38 % (ref 36.0–46.0)
Hemoglobin: 11.3 g/dL — ABNORMAL LOW (ref 12.0–15.0)
MCH: 21.3 pg — ABNORMAL LOW (ref 26.0–34.0)
MCHC: 29.7 g/dL — ABNORMAL LOW (ref 30.0–36.0)
MCV: 71.7 fL — ABNORMAL LOW (ref 80.0–100.0)
Platelets: 443 10*3/uL — ABNORMAL HIGH (ref 150–400)
RBC: 5.3 MIL/uL — ABNORMAL HIGH (ref 3.87–5.11)
RDW: 16.6 % — ABNORMAL HIGH (ref 11.5–15.5)
WBC: 11 10*3/uL — ABNORMAL HIGH (ref 4.0–10.5)
nRBC: 0 % (ref 0.0–0.2)

## 2022-12-06 LAB — COMPREHENSIVE METABOLIC PANEL
ALT: 15 U/L (ref 0–44)
AST: 21 U/L (ref 15–41)
Albumin: 4.6 g/dL (ref 3.5–5.0)
Alkaline Phosphatase: 71 U/L (ref 38–126)
Anion gap: 13 (ref 5–15)
BUN: 10 mg/dL (ref 6–20)
CO2: 25 mmol/L (ref 22–32)
Calcium: 8.8 mg/dL — ABNORMAL LOW (ref 8.9–10.3)
Chloride: 104 mmol/L (ref 98–111)
Creatinine, Ser: 0.78 mg/dL (ref 0.44–1.00)
GFR, Estimated: >60 mL/min (ref >60)
Glucose, Bld: 99 mg/dL (ref 70–99)
Potassium: 3.3 mmol/L — ABNORMAL LOW (ref 3.5–5.1)
Sodium: 142 mmol/L (ref 135–145)
Total Bilirubin: 0.3 mg/dL (ref 0.3–1.2)
Total Protein: 8.1 g/dL (ref 6.5–8.1)

## 2022-12-06 LAB — I-STAT BETA HCG BLOOD, ED (MC, WL, AP ONLY): I-stat hCG, quantitative: <5 m[IU]/mL (ref <5)

## 2022-12-06 LAB — ETHANOL: Alcohol, Ethyl (B): 217 mg/dL — ABNORMAL HIGH (ref <10)

## 2022-12-06 NOTE — Discharge Instructions (Signed)
Return for any problem.  ?

## 2022-12-06 NOTE — ED Triage Notes (Addendum)
[facility] coming [facility] with c/o ETOH with n/v. Patient reportedly drank "a bottle and a half of liquor." And patient reports smoking cannabis this morning. Patient states she does not drink every day and states she was drinking because she was stressed.

## 2022-12-06 NOTE — Telephone Encounter (Signed)
Called Pt to go over +BV &UTI results and that Rx has been sent to pharmacy, no answer, left VM for call back.

## 2022-12-06 NOTE — ED Provider Notes (Signed)
[facility] Provider Note   CSN: [id number] Arrival date & time: 12/06/22  1516     History  Chief Complaint  Patient presents with   Alcohol Intoxication    Holly Vazquez is a 42 y.o. female.  42 year old female with prior medical history as detailed below presents for evaluation.  Patient admits to drinking significant alcohol earlier today.  Patient is otherwise without complaint.  Patient reports that she is tired after excessive alcohol consumption.  The history is provided by the patient and medical records.       Home Medications Prior to Admission medications   Medication Sig Start Date End Date Taking? Authorizing Provider  acetaminophen (TYLENOL) 500 MG tablet Take 2 tablets (1,000 mg total) by mouth every 8 (eight) hours as needed (pain). 01/23/22   Genia Del, MD  Blood Pressure Monitoring DEVI 1 each by Does not apply route once a week. 06/28/21   Chancy Milroy, MD  clindamycin (CLEOCIN) 300 MG capsule Take 1 capsule (300 mg total) by mouth in the morning and at bedtime. 06/30/22   Donnamae Jude, MD  ferrous sulfate 325 (65 FE) MG tablet Take 1 tablet (325 mg total) by mouth every other day. Patient not taking: Reported on 06/28/2022 07/15/21   Chancy Milroy, MD  hydrOXYzine (ATARAX) 25 MG tablet Take 1 tablet (25 mg total) by mouth every 6 (six) hours as needed. 09/29/22   Briant Cedar, MD  ibuprofen (ADVIL) 600 MG tablet Take 1 tablet (600 mg total) by mouth every 6 (six) hours as needed (pain). Patient not taking: Reported on 11/09/2022 01/23/22   Genia Del, MD  mirtazapine (REMERON) 7.5 MG tablet Take 1 tablet (7.5 mg total) by mouth at bedtime. 11/03/22   Briant Cedar, MD  nitrofurantoin, macrocrystal-monohydrate, (MACROBID) 100 MG capsule Take 1 capsule (100 mg total) by mouth 2 (two) times daily. 11/14/22   Darliss Cheney, MD  Prenatal Vit-Fe Fumarate-FA (PREPLUS) 27-1 MG  TABS Take 1 tablet by mouth daily. 07/11/21   Chancy Milroy, MD  venlafaxine XR (EFFEXOR XR) 75 MG 24 hr capsule Take 1 capsule (75 mg total) by mouth daily. 11/03/22   Briant Cedar, MD      Allergies    Flagyl [metronidazole], Latex, Nickel, and Penicillins    Review of Systems   Review of Systems  All other systems reviewed and are negative.   Physical Exam Updated Vital Signs BP (!) 148/127   Pulse 86   Temp 98.5 F (36.9 C) (Oral)   Resp 20   Ht 5' 4.5" (1.638 m)   LMP 11/29/2022 (Approximate)   SpO2 100%   BMI 30.20 kg/m  Physical Exam Vitals and nursing note reviewed.  Constitutional:      General: She is not in acute distress.    Appearance: Normal appearance. She is well-developed.  HENT:     Head: Normocephalic and atraumatic.  Eyes:     Conjunctiva/sclera: Conjunctivae normal.     Pupils: Pupils are equal, round, and reactive to light.  Cardiovascular:     Rate and Rhythm: Normal rate and regular rhythm.     Heart sounds: Normal heart sounds.  Pulmonary:     Effort: Pulmonary effort is normal. No respiratory distress.     Breath sounds: Normal breath sounds.  Abdominal:     General: There is no distension.     Palpations: Abdomen is soft.     Tenderness:  There is no abdominal tenderness.  Musculoskeletal:        General: No deformity. Normal range of motion.     Cervical back: Normal range of motion and neck supple.  Skin:    General: Skin is warm and dry.  Neurological:     General: No focal deficit present.     Mental Status: She is alert and oriented to person, place, and time.     ED Results / Procedures / Treatments   Labs (all labs ordered are listed, but only abnormal results are displayed) Labs Reviewed  COMPREHENSIVE METABOLIC PANEL - Abnormal; Notable for the following components:      Result Value   Potassium 3.3 (*)    Calcium 8.8 (*)    All other components within normal limits  ETHANOL - Abnormal; Notable for the  following components:   Alcohol, Ethyl (B) 217 (*)    All other components within normal limits  CBC - Abnormal; Notable for the following components:   WBC 11.0 (*)    RBC 5.30 (*)    Hemoglobin 11.3 (*)    MCV 71.7 (*)    MCH 21.3 (*)    MCHC 29.7 (*)    RDW 16.6 (*)    Platelets 443 (*)    All other components within normal limits  RAPID URINE DRUG SCREEN, HOSP PERFORMED  PREGNANCY, URINE  I-STAT BETA HCG BLOOD, ED ([facility], [facility], [facility] ONLY)    EKG EKG Interpretation  Date/Time:  Wednesday December 06 2022 15:40:33 EST Ventricular Rate:  66 PR Interval:  130 QRS Duration: 102 QT Interval:  436 QTC Calculation: 457 R Axis:   107 Text Interpretation: Right and left arm electrode reversal, interpretation assumes no reversal Sinus rhythm Biatrial enlargement Probable lateral infarct, age indeterminate Confirmed by Dene Gentry [phone]) on 12/06/2022 4:13:49 PM  Radiology No results found.  Procedures Procedures    Medications Ordered in ED Medications - No data to display  ED Course/ Medical Decision Making/ A&P                             Medical Decision Making Amount and/or Complexity of Data Reviewed Labs: ordered.    Medical Screen Complete  This patient presented to the ED with complaint of alcohol intoxication.  This complaint involves an extensive number of treatment options. The initial differential diagnosis includes, but is not limited to, alcohol intoxication, metabolic abnormality  This presentation is: Acute, Chronic, Self-Limited, Previously Undiagnosed, Uncertain Prognosis, Complicated, Systemic Symptoms, and Threat to Life/Bodily Function  Patient is presenting after self-reported excessive alcohol intake.  Exam is consistent with acute alcohol intoxication.  Labs confirm elevated alcohol level at 217.  After period of observation the patient is clinically sober.  She now desires discharge.  She understands need for close outpatient follow-up.   She is advised that alcohol consumption in moderation or abstinence is better for her long-term health.  Importance of close follow-up stressed.  Strict return precautions given understood.    Additional history obtained:  External records from outside sources obtained and reviewed including prior ED visits and prior Inpatient records.    Lab Tests:  I ordered and personally interpreted labs.  The pertinent results include: CBC, CMP, EtOH, hCG   Cardiac Monitoring:  The patient was maintained on a cardiac monitor.  I personally viewed and interpreted the cardiac monitor which showed an underlying rhythm of: NSR   Problem List / ED  Course:  Alcohol intoxication   Reevaluation:  After the interventions noted above, I reevaluated the patient and found that they have: improved  Disposition:  After consideration of the diagnostic results and the patients response to treatment, I feel that the patent would benefit from close outpatient follow-up.          Final Clinical Impression(s) / ED Diagnoses Final diagnoses:  Alcoholic intoxication without complication [facility])    Rx / DC Orders ED Discharge Orders     None         Valarie Merino, MD 12/06/22 1945

## 2022-12-27 ENCOUNTER — Ambulatory Visit (INDEPENDENT_AMBULATORY_CARE_PROVIDER_SITE_OTHER): Payer: Medicaid Other | Admitting: *Deleted

## 2022-12-27 ENCOUNTER — Other Ambulatory Visit (HOSPITAL_COMMUNITY)
Admission: RE | Admit: 2022-12-27 | Discharge: 2022-12-27 | Disposition: A | Discharge status: Home / Self Care | Payer: Medicaid Other | Source: Ambulatory Visit | Attending: Family Medicine | Admitting: Family Medicine

## 2022-12-27 VITALS — BP 112/61 | HR 72 | Ht 64.5 in | Wt 182.4 lb

## 2022-12-27 DIAGNOSIS — N898 Other specified noninflammatory disorders of vagina: Secondary | ICD-10-CM

## 2022-12-27 DIAGNOSIS — R82998 Other abnormal findings in urine: Secondary | ICD-10-CM | POA: Diagnosis not present

## 2022-12-27 DIAGNOSIS — R319 Hematuria, unspecified: Secondary | ICD-10-CM

## 2022-12-27 DIAGNOSIS — Z87440 Personal history of urinary (tract) infections: Secondary | ICD-10-CM

## 2022-12-27 LAB — POCT URINALYSIS DIP (DEVICE)
Bilirubin Urine: NEGATIVE
Glucose, UA: NEGATIVE mg/dL
Ketones, ur: NEGATIVE mg/dL
Nitrite: NEGATIVE
Protein, ur: NEGATIVE mg/dL
Specific Gravity, Urine: 1.025 (ref 1.005–1.030)
Urobilinogen, UA: 0.2 mg/dL (ref 0.0–1.0)
pH: 6.5 (ref 5.0–8.0)

## 2022-12-27 NOTE — Progress Notes (Signed)
Had BV and uti in January. States took macrobid but not sure if took correctly. Wants to check ua just to be sure. UA with hematuria and leukocytes, sent for culture. Also c/o still having yellowish vaginal discharge and odor. States not aware vaginal cream was ordered in January and she did not take it  . Will do self swab to see if anything else tests positive besides BV.  Advised will be contacted if anything positive and needs treatment. We discussed if she has any infections she should take medication until she has taken all of it. She voices understanding. Staci Acosta

## 2022-12-28 ENCOUNTER — Other Ambulatory Visit: Payer: Self-pay | Admitting: Obstetrics and Gynecology

## 2022-12-28 DIAGNOSIS — N760 Acute vaginitis: Secondary | ICD-10-CM

## 2022-12-28 LAB — CERVICOVAGINAL ANCILLARY ONLY
Bacterial Vaginitis (gardnerella): POSITIVE — AB
Candida Glabrata: NEGATIVE
Candida Vaginitis: NEGATIVE
Chlamydia: NEGATIVE
Comment: NEGATIVE
Comment: NEGATIVE
Comment: NEGATIVE
Comment: NEGATIVE
Comment: NEGATIVE
Comment: NORMAL
Neisseria Gonorrhea: NEGATIVE
Trichomonas: NEGATIVE

## 2022-12-28 MED ORDER — CLINDAMYCIN PHOSPHATE 2 % VA CREA: 1.0000 | TOPICAL_CREAM | Freq: Every day | VAGINAL | 0 refills | Status: AC

## 2022-12-29 LAB — URINE CULTURE

## 2023-01-03 ENCOUNTER — Telehealth: Payer: Self-pay | Admitting: Family Medicine

## 2023-01-03 ENCOUNTER — Encounter: Payer: Self-pay | Admitting: *Deleted

## 2023-01-03 DIAGNOSIS — B9689 Other specified bacterial agents as the cause of diseases classified elsewhere: Secondary | ICD-10-CM

## 2023-01-03 MED ORDER — CLINDAMYCIN HCL 300 MG PO CAPS: 300.0000 mg | ORAL_CAPSULE | Freq: Two times a day (BID) | ORAL | 0 refills | Status: AC

## 2023-01-03 NOTE — Telephone Encounter (Signed)
Patient calling about her medication for her BV

## 2023-01-03 NOTE — Telephone Encounter (Signed)
I called patient and she states her medication for BV is not covered by Medicaid and she needs medication. I explained I will call pharmacy to clarify if Clindamycin pills or other are covered and we will send in a new RX and I will send her a MyChart message to let her know. She voices agreement with plan. I called [facility] and they informed me Clindamycin Gel not covered but tablets are covered. Discussed with Dr. Ernestina Patches and new RX sent in. Message sent to [place].  Staci Acosta

## 2023-02-02 ENCOUNTER — Telehealth (INDEPENDENT_AMBULATORY_CARE_PROVIDER_SITE_OTHER): Payer: Medicaid Other | Admitting: Student in an Organized Health Care Education/Training Program

## 2023-02-02 DIAGNOSIS — F419 Anxiety disorder, unspecified: Secondary | ICD-10-CM

## 2023-02-02 DIAGNOSIS — F3281 Premenstrual dysphoric disorder: Secondary | ICD-10-CM

## 2023-02-02 DIAGNOSIS — F4310 Post-traumatic stress disorder, unspecified: Secondary | ICD-10-CM

## 2023-02-02 DIAGNOSIS — F331 Major depressive disorder, recurrent, moderate: Secondary | ICD-10-CM

## 2023-02-02 MED ORDER — MIRTAZAPINE 15 MG PO TABS: 15.0000 mg | ORAL_TABLET | Freq: Every day | ORAL | 1 refills | Status: DC

## 2023-02-02 MED ORDER — HYDROXYZINE HCL 25 MG PO TABS: 25.0000 mg | ORAL_TABLET | Freq: Four times a day (QID) | ORAL | 1 refills | Status: DC | PRN

## 2023-02-02 MED ORDER — VENLAFAXINE HCL ER 75 MG PO CP24: 75.0000 mg | ORAL_CAPSULE | Freq: Every day | ORAL | 1 refills | Status: DC
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
## 2023-02-03 ENCOUNTER — Encounter (HOSPITAL_COMMUNITY): Payer: Self-pay | Admitting: Student in an Organized Health Care Education/Training Program

## 2023-02-22 ENCOUNTER — Encounter (HOSPITAL_COMMUNITY): Payer: Self-pay | Admitting: Student in an Organized Health Care Education/Training Program

## 2023-02-22 ENCOUNTER — Telehealth (INDEPENDENT_AMBULATORY_CARE_PROVIDER_SITE_OTHER): Payer: Medicaid Other | Admitting: Student in an Organized Health Care Education/Training Program

## 2023-02-22 DIAGNOSIS — F419 Anxiety disorder, unspecified: Secondary | ICD-10-CM | POA: Diagnosis not present

## 2023-02-22 DIAGNOSIS — F331 Major depressive disorder, recurrent, moderate: Secondary | ICD-10-CM

## 2023-02-22 DIAGNOSIS — F3281 Premenstrual dysphoric disorder: Secondary | ICD-10-CM

## 2023-02-22 DIAGNOSIS — F4310 Post-traumatic stress disorder, unspecified: Secondary | ICD-10-CM

## 2023-02-22 MED ORDER — HYDROXYZINE HCL 25 MG PO TABS: 25.0000 mg | ORAL_TABLET | Freq: Four times a day (QID) | ORAL | 1 refills | Status: AC | PRN

## 2023-02-22 MED ORDER — VENLAFAXINE HCL ER 37.5 MG PO CP24: 112.5000 mg | ORAL_CAPSULE | Freq: Every day | ORAL | 1 refills | Status: AC

## 2023-02-22 MED ORDER — MIRTAZAPINE 15 MG PO TABS: 15.0000 mg | ORAL_TABLET | Freq: Every day | ORAL | 1 refills | Status: AC
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
## 2023-03-02 ENCOUNTER — Telehealth (HOSPITAL_COMMUNITY): Payer: Medicaid Other | Admitting: Student in an Organized Health Care Education/Training Program

## 2023-03-07 ENCOUNTER — Emergency Department (HOSPITAL_COMMUNITY)
Admission: EM | Admit: 2023-03-07 | Discharge: 2023-03-07 | Disposition: A | Discharge status: Home / Self Care | Payer: Medicaid Other | Attending: Emergency Medicine | Admitting: Emergency Medicine

## 2023-03-07 ENCOUNTER — Emergency Department (HOSPITAL_COMMUNITY): Payer: Medicaid Other

## 2023-03-07 ENCOUNTER — Other Ambulatory Visit: Payer: Self-pay

## 2023-03-07 ENCOUNTER — Encounter (HOSPITAL_COMMUNITY): Payer: Self-pay | Admitting: Pharmacy Technician

## 2023-03-07 DIAGNOSIS — S0081XA Abrasion of other part of head, initial encounter: Secondary | ICD-10-CM | POA: Insufficient documentation

## 2023-03-07 DIAGNOSIS — S51011A Laceration without foreign body of right elbow, initial encounter: Secondary | ICD-10-CM | POA: Insufficient documentation

## 2023-03-07 DIAGNOSIS — M25561 Pain in right knee: Secondary | ICD-10-CM | POA: Diagnosis not present

## 2023-03-07 DIAGNOSIS — M542 Cervicalgia: Secondary | ICD-10-CM | POA: Diagnosis not present

## 2023-03-07 DIAGNOSIS — R0902 Hypoxemia: Secondary | ICD-10-CM | POA: Diagnosis not present

## 2023-03-07 DIAGNOSIS — S80211A Abrasion, right knee, initial encounter: Secondary | ICD-10-CM | POA: Insufficient documentation

## 2023-03-07 DIAGNOSIS — S59901A Unspecified injury of right elbow, initial encounter: Secondary | ICD-10-CM | POA: Diagnosis present

## 2023-03-07 DIAGNOSIS — M25521 Pain in right elbow: Secondary | ICD-10-CM | POA: Diagnosis not present

## 2023-03-07 DIAGNOSIS — Y92410 Unspecified street and highway as the place of occurrence of the external cause: Secondary | ICD-10-CM | POA: Insufficient documentation

## 2023-03-07 MED ORDER — OXYCODONE HCL 5 MG PO TABS
5.0000 mg | ORAL_TABLET | Freq: Once | ORAL | Status: AC
  Administered 2023-03-07: 5 mg via ORAL
  Filled 2023-03-07: qty 1

## 2023-03-07 MED ORDER — IBUPROFEN 800 MG PO TABS
800.0000 mg | ORAL_TABLET | Freq: Once | ORAL | Status: AC
  Administered 2023-03-07: 800 mg via ORAL
  Filled 2023-03-07: qty 1

## 2023-03-07 MED ORDER — ACETAMINOPHEN 500 MG PO TABS
1000.0000 mg | ORAL_TABLET | Freq: Once | ORAL | Status: AC
  Administered 2023-03-07: 1000 mg via ORAL
  Filled 2023-03-07: qty 2

## 2023-03-07 MED ORDER — LIDOCAINE-EPINEPHRINE (PF) 2 %-1:200000 IJ SOLN
20.0000 mL | Freq: Once | INTRAMUSCULAR | Status: AC
  Administered 2023-03-07: 20 mL via INTRADERMAL
  Filled 2023-03-07: qty 20

## 2023-03-07 NOTE — ED Notes (Signed)
Patient verbalizes understanding of discharge instructions. Opportunity for questioning and answers were provided. Armband removed by staff, pt discharged from ED. Pt taken to ED waiting room via wheel chair.  
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
## 2023-04-11 ENCOUNTER — Ambulatory Visit (HOSPITAL_COMMUNITY): Payer: Medicaid Other | Admitting: Student

## 2024-06-07 ENCOUNTER — Ambulatory Visit
Admission: EM | Admit: 2024-06-07 | Discharge: 2024-06-07 | Disposition: A | Discharge status: Home / Self Care | Attending: Physician Assistant | Admitting: Physician Assistant

## 2024-06-07 ENCOUNTER — Other Ambulatory Visit: Payer: Self-pay

## 2024-06-07 DIAGNOSIS — N611 Abscess of the breast and nipple: Secondary | ICD-10-CM

## 2024-06-07 MED ORDER — MUPIROCIN 2 % EX OINT: 1.0000 | TOPICAL_OINTMENT | Freq: Two times a day (BID) | CUTANEOUS | 0 refills | Status: AC

## 2024-06-07 MED ORDER — ACETAMINOPHEN 325 MG PO TABS
650.0000 mg | ORAL_TABLET | Freq: Once | ORAL | Status: AC
  Administered 2024-06-07: 650 mg via ORAL

## 2024-06-07 MED ORDER — CEPHALEXIN 500 MG PO CAPS: 500.0000 mg | ORAL_CAPSULE | Freq: Four times a day (QID) | ORAL | 0 refills | Status: AC

## 2024-06-07 NOTE — ED Triage Notes (Signed)
 Pt presents with a chief complaint of abscess on left breast x one year. Began to have drainage about two days ago. Pt reports the abscess is still draining, worried for infection. Currently rates overall pain in this area a 5/10. Neosporin applied. Prescribed Cephalexin  by OB-GYN, has taken for six days but states she is taking them incorrectly/ not consistent. States she has been homeless and has not taken care of her health.

## 2024-06-07 NOTE — Discharge Instructions (Signed)
 It appears that you have an abscess  I have sent in an antibiotic to treat this but there are a few things you can do to help with the discomfort while the antibiotic is kicking in  You can have Sitz baths or warm compresses (sitting with the affected area in a warm bath for about 15-20 minutes. You can add Epsom salts if desired) Keeping the area clean and dry. A gentle soap and warm water should be sufficient for this. Try to avoid using topicals on the area as this could irritate it more. (This means no neosporin, alcohol, peroxide, lotions or creams) Avoid squeezing or manipulating the area. Do not try to pop the bump as this can cause further complications. If the bump starts to drain on its own you can gently wipe the area as needed to remove the contents but do not try to manipulate it further.   If you start to notice increased pain, severe drainage, fever, chills, redness, streaking along the area, increased swelling'; please let us  know or go to the ED for evaluation.

## 2024-06-07 NOTE — ED Provider Notes (Signed)
 [facility]    CSN: [id number] Arrival date & time: 06/07/24  1041      History   Chief Complaint Chief Complaint  Patient presents with   Abscess    HPI Holly Vazquez is a 43 y.o. female.   HPI  Pt presents today with concerns for an abscess on the side of her left breast  She states this has been present for about a year. 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   Anemia    due to heavy menses, on iron   Anxiety    Cervical polyp 03/20/2018   Removed 03-20-18   Contact dermatitis and other eczema, due to unspecified cause    Depression    Fibroids    Heart murmur    States always told it was from anemia   Restless legs syndrome (RLS)    Urinary tract infection, site not specified     Patient Active Problem List   Diagnosis Date Noted   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   PTSD (post-traumatic stress disorder) 10/20/2021   MDD (major depressive disorder), recurrent episode, moderate (HCC) 10/20/2021   Food insecurity 09/21/2021   Adjustment disorder with mixed anxiety and depressed mood 09/05/2021   Tetrahydrocannabinol (THC) use disorder, mild, abuse 08/08/2021   Alpha thalassemia silent carrier 08/05/2021   GAD (generalized anxiety disorder) 09/22/2020   PMDD (premenstrual dysphoric disorder) 07/28/2020   Anemia 07/06/2011    Past Surgical History:  Procedure Laterality Date    WISDOM TOOTH EXTRACTION      OB History     Gravida  6   Para  6   Term  6   Preterm  0   AB  0   Living  7      SAB  0   IAB  0   Ectopic  0   Multiple  1   Live Births  5            Home Medications    Prior to Admission medications   Medication Sig Start Date End Date Taking? Authorizing Provider  cephALEXin  (KEFLEX ) 500 MG capsule Take 1 capsule (500 mg total) by mouth 4 (four) times daily for 7 days. 06/07/24 06/14/24 Yes Kaymen Adrian E, PA-C  mupirocin  ointment (BACTROBAN ) 2 % Apply 1 Application topically 2 (two) times daily for 7 days. 06/07/24 06/14/24 Yes Belton Peplinski E, PA-C  acetaminophen  (TYLENOL ) 500 MG tablet Take 2 tablets (1,000 mg total) by mouth every 8 (eight) hours as needed (pain). 01/23/22   Clem Tawni HERO, MD  Blood Pressure Monitoring DEVI 1 each by Does not apply route once a week. 06/28/21   Ervin, Michael L, MD  ferrous sulfate  325 (65 FE) MG tablet Take 1 tablet (325 mg total) by mouth every other day. 07/15/21   Ervin, Michael L, MD  hydrOXYzine  (ATARAX ) 25  MG tablet Take 1 tablet (25 mg total) by mouth every 6 (six) hours as needed. 02/22/23   Pashayan, Marsa RAMAN, DO  ibuprofen  (ADVIL ) 600 MG tablet Take 1 tablet (600 mg total) by mouth every 6 (six) hours as needed (pain). Patient not taking: Reported on 11/09/2022 01/23/22   Clem Tawni HERO, MD  mirtazapine  (REMERON ) 15 MG tablet Take 1 tablet (15 mg total) by mouth at bedtime. 02/22/23   Pashayan, Marsa RAMAN, DO  nitrofurantoin , macrocrystal-monohydrate, (MACROBID ) 100 MG capsule Take 1 capsule (100 mg total) by mouth 2 (two) times daily. 11/14/22   Ajewole, Christana, MD  Prenatal Vit-Fe Fumarate-FA (PREPLUS) 27-1 MG TABS Take 1 tablet by mouth daily. 07/11/21   Ervin, Michael L, MD  venlafaxine  XR (EFFEXOR  XR) 37.5 MG 24 hr capsule Take 3 capsules (112.5 mg total) by mouth daily. 02/22/23   Raliegh Marsa RAMAN, DO    Family History Family History  Problem Relation Age of Onset    Lung cancer Mother    Hypertension Father    Heart disease Maternal Grandmother    Stroke Maternal Grandmother    Hypertension Maternal Grandmother    Eczema Son    Eczema Son    Eczema Son     Social History Social History   Tobacco Use   Smoking status: Every Day    Types: Cigarettes, Cigars   Smokeless tobacco: Never  Vaping Use   Vaping status: Never Used  Substance Use Topics   Alcohol use: Yes   Drug use: Yes    Types: Marijuana     Allergies   Flagyl  [metronidazole ], Latex, Nickel, and Penicillins   Review of Systems Review of Systems  Constitutional:  Positive for chills. Negative for fever.  Skin:        Abscess to left breast      Physical Exam Triage Vital Signs ED Triage Vitals  Encounter Vitals Group     BP 06/07/24 1054 110/76     Girls Systolic BP Percentile --      Girls Diastolic BP Percentile --      Boys Systolic BP Percentile --      Boys Diastolic BP Percentile --      Pulse Rate 06/07/24 1054 87     Resp 06/07/24 1054 19     Temp 06/07/24 1054 98.4 F (36.9 C)     Temp Source 06/07/24 1054 Oral     SpO2 06/07/24 1054 99 %     Weight 06/07/24 1103 198 lb (89.8 kg)     Height 06/07/24 1103 5' 4.5 (1.638 m)     Head Circumference --      Peak Flow --      Pain Score 06/07/24 1103 5     Pain Loc --      Pain Education --      Exclude from Growth Chart --    No data found.  Updated Vital Signs BP 110/76 (BP Location: Right Arm)   Pulse 87   Temp 98.4 F (36.9 C) (Oral)   Resp 19   Ht 5' 4.5 (1.638 m)   Wt 198 lb (89.8 kg)   LMP 06/05/2024 (Approximate)   SpO2 99%   Breastfeeding No   BMI 33.46 kg/m   Visual Acuity Right Eye Distance:   Left Eye Distance:   Bilateral Distance:    Right Eye Near:   Left Eye Near:    Bilateral Near:     Physical Exam Vitals reviewed.  Constitutional:  General: She is awake.     Appearance: Normal appearance. She is well-developed and well-groomed.  HENT:     Head:  Normocephalic and atraumatic.  Eyes:     General: Lids are normal. Gaze aligned appropriately.     Extraocular Movements: Extraocular movements intact.     Conjunctiva/sclera: Conjunctivae normal.  Pulmonary:     Effort: Pulmonary effort is normal.  Chest:  Breasts:    Left: Tenderness present.       Comments: Patient declined chaperone for examination of breast abscess  Patient has an abscess along the lateral aspect of the left breast that is approximately 4 cm diameter and actively draining.  Area is very tender and patient is resistant to allowing appropriate palpation to assess borders or fluctuance/induration.  There is central fluctuance and surrounding induration.  No obvious evidence of surrounding erythema or severe swelling to surrounding tissues Neurological:     Mental Status: She is alert and oriented to person, place, and time.  Psychiatric:        Attention and Perception: Attention and perception normal.        Mood and Affect: Mood and affect normal.        Speech: Speech normal.        Behavior: Behavior normal. Behavior is cooperative.      UC Treatments / Results  Labs (all labs ordered are listed, but only abnormal results are displayed) Labs Reviewed - No data to display  EKG   Radiology No results found.  Procedures Procedures (including critical care time)  Medications Ordered in UC Medications  acetaminophen  (TYLENOL ) tablet 650 mg (650 mg Oral Given 06/07/24 1153)    Initial Impression / Assessment and Plan / UC Course  I have reviewed the triage vital signs and the nursing notes.  Pertinent labs & imaging results that were available during my care of the patient were reviewed by me and considered in my medical decision making (see chart for details).      Final Clinical Impressions(s) / UC Diagnoses   Final diagnoses:  Abscess of left breast   Patient presents today with concerns for an abscess of the left breast that has been  present for some time and recently opened and started to drain.  Physical exam is notable for approximately 4 cm diameter actively draining abscess of the left breast.  To assist with resolution will start patient on Keflex  p.o. 4 times daily x 7 days.  Reviewed with patient that it is important she completes the entire course of the antibiotic as directed for adequate resolution.  Will also start patient on mupirocin  ointment to further assist with potential infection.  Recommend alternating Tylenol  and ibuprofen  as needed for pain management.  Reviewed that she can apply warm compresses to the area to further assist with drainage as well as home care measures which were provided in AVS.  ED and return precautions reviewed and provided in after visit summary.  Follow-up as needed.    Discharge Instructions      It appears that you have an abscess  I have sent in an antibiotic to treat this but there are a few things you can do to help with the discomfort while the antibiotic is kicking in  You can have Sitz baths or warm compresses (sitting with the affected area in a warm bath for about 15-20 minutes. You can add Epsom salts if desired) Keeping the area clean and dry. A gentle soap and warm water should  be sufficient for this. Try to avoid using topicals on the area as this could irritate it more. (This means no neosporin, alcohol, peroxide, lotions or creams) Avoid squeezing or manipulating the area. Do not try to pop the bump as this can cause further complications. If the bump starts to drain on its own you can gently wipe the area as needed to remove the contents but do not try to manipulate it further.   If you start to notice increased pain, severe drainage, fever, chills, redness, streaking along the area, increased swelling'; please let us  know or go to the ED for evaluation.       ED Prescriptions     Medication Sig Dispense Auth. Provider   cephALEXin  (KEFLEX ) 500 MG capsule Take  1 capsule (500 mg total) by mouth 4 (four) times daily for 7 days. 28 capsule Derriana Oser E, PA-C   mupirocin  ointment (BACTROBAN ) 2 % Apply 1 Application topically 2 (two) times daily for 7 days. 22 g Shuntay Everetts E, PA-C      PDMP not reviewed this encounter.   Shalinda Burkholder, Rocky BRAVO, PA-C 06/07/24 1708
